# Patient Record
Sex: Female | Born: 1935 | Race: White | Hispanic: No | Marital: Married | Smoking: Former smoker
Health system: Southern US, Community
[De-identification: ages and names within clinical notes are randomized; demographics above are authoritative.]

## PROBLEM LIST (undated history)

## (undated) DIAGNOSIS — K219 Gastro-esophageal reflux disease without esophagitis: Secondary | ICD-10-CM

## (undated) DIAGNOSIS — C50919 Malignant neoplasm of unspecified site of unspecified female breast: Secondary | ICD-10-CM

## (undated) DIAGNOSIS — C801 Malignant (primary) neoplasm, unspecified: Secondary | ICD-10-CM

## (undated) DIAGNOSIS — I209 Angina pectoris, unspecified: Secondary | ICD-10-CM

## (undated) DIAGNOSIS — I10 Essential (primary) hypertension: Secondary | ICD-10-CM

## (undated) DIAGNOSIS — C3411 Malignant neoplasm of upper lobe, right bronchus or lung: Secondary | ICD-10-CM

## (undated) DIAGNOSIS — F32A Depression, unspecified: Secondary | ICD-10-CM

## (undated) DIAGNOSIS — D649 Anemia, unspecified: Secondary | ICD-10-CM

## (undated) DIAGNOSIS — E119 Type 2 diabetes mellitus without complications: Secondary | ICD-10-CM

## (undated) DIAGNOSIS — M1990 Unspecified osteoarthritis, unspecified site: Secondary | ICD-10-CM

## (undated) DIAGNOSIS — Z950 Presence of cardiac pacemaker: Secondary | ICD-10-CM

## (undated) DIAGNOSIS — F329 Major depressive disorder, single episode, unspecified: Secondary | ICD-10-CM

## (undated) DIAGNOSIS — J449 Chronic obstructive pulmonary disease, unspecified: Secondary | ICD-10-CM

## (undated) DIAGNOSIS — I509 Heart failure, unspecified: Secondary | ICD-10-CM

## (undated) DIAGNOSIS — I499 Cardiac arrhythmia, unspecified: Secondary | ICD-10-CM

## (undated) DIAGNOSIS — I2510 Atherosclerotic heart disease of native coronary artery without angina pectoris: Secondary | ICD-10-CM

## (undated) HISTORY — PX: APPENDECTOMY: SHX54

## (undated) HISTORY — PX: MASTECTOMY: SHX3

## (undated) HISTORY — PX: CHOLECYSTECTOMY: SHX55

## (undated) HISTORY — PX: CARDIOVERSION: SHX1299

## (undated) HISTORY — DX: Malignant neoplasm of unspecified site of unspecified female breast: C50.919

## (undated) HISTORY — PX: CORONARY ANGIOPLASTY: SHX604

## (undated) HISTORY — PX: JOINT REPLACEMENT: SHX530

## (undated) HISTORY — PX: INSERT / REPLACE / REMOVE PACEMAKER: SUR710

## (undated) HISTORY — DX: Malignant neoplasm of upper lobe, right bronchus or lung: C34.11

## (undated) HISTORY — PX: ABLATION OF DYSRHYTHMIC FOCUS: SHX254

## (undated) HISTORY — PX: CORONARY ARTERY BYPASS GRAFT: SHX141

---

## 1997-08-15 ENCOUNTER — Ambulatory Visit (HOSPITAL_COMMUNITY): Admission: RE | Admit: 1997-08-15 | Discharge: 1997-08-15 | Payer: Self-pay | Admitting: *Deleted

## 1999-05-27 ENCOUNTER — Inpatient Hospital Stay (HOSPITAL_COMMUNITY): Admission: EM | Admit: 1999-05-27 | Discharge: 1999-06-13 | Payer: Self-pay | Admitting: Cardiothoracic Surgery

## 1999-05-28 ENCOUNTER — Encounter: Payer: Self-pay | Admitting: Cardiothoracic Surgery

## 1999-05-29 ENCOUNTER — Encounter: Payer: Self-pay | Admitting: Cardiothoracic Surgery

## 1999-05-30 ENCOUNTER — Encounter: Payer: Self-pay | Admitting: Cardiothoracic Surgery

## 1999-05-31 ENCOUNTER — Encounter: Payer: Self-pay | Admitting: Cardiothoracic Surgery

## 1999-06-09 ENCOUNTER — Encounter: Payer: Self-pay | Admitting: Cardiothoracic Surgery

## 1999-06-10 ENCOUNTER — Encounter: Payer: Self-pay | Admitting: Thoracic Surgery (Cardiothoracic Vascular Surgery)

## 1999-06-10 ENCOUNTER — Encounter: Payer: Self-pay | Admitting: Cardiothoracic Surgery

## 1999-06-14 ENCOUNTER — Inpatient Hospital Stay (HOSPITAL_COMMUNITY): Admission: EM | Admit: 1999-06-14 | Discharge: 1999-06-22 | Payer: Self-pay | Admitting: Emergency Medicine

## 1999-06-18 ENCOUNTER — Encounter: Payer: Self-pay | Admitting: Cardiothoracic Surgery

## 1999-06-19 ENCOUNTER — Encounter: Payer: Self-pay | Admitting: Cardiothoracic Surgery

## 1999-06-20 ENCOUNTER — Encounter: Payer: Self-pay | Admitting: Cardiothoracic Surgery

## 2004-07-16 ENCOUNTER — Ambulatory Visit: Payer: Self-pay | Admitting: Orthopedic Surgery

## 2004-07-17 ENCOUNTER — Ambulatory Visit: Payer: Self-pay | Admitting: Orthopedic Surgery

## 2004-07-21 ENCOUNTER — Inpatient Hospital Stay: Payer: Self-pay | Admitting: Vascular Surgery

## 2004-07-28 ENCOUNTER — Ambulatory Visit: Payer: Self-pay | Admitting: Vascular Surgery

## 2004-08-08 ENCOUNTER — Inpatient Hospital Stay: Payer: Self-pay | Admitting: Internal Medicine

## 2004-08-22 ENCOUNTER — Observation Stay: Payer: Self-pay | Admitting: Vascular Surgery

## 2004-10-01 ENCOUNTER — Ambulatory Visit: Payer: Self-pay | Admitting: Orthopedic Surgery

## 2004-10-15 ENCOUNTER — Other Ambulatory Visit: Payer: Self-pay

## 2004-10-15 ENCOUNTER — Ambulatory Visit: Payer: Self-pay | Admitting: Orthopedic Surgery

## 2004-10-27 ENCOUNTER — Ambulatory Visit: Payer: Self-pay | Admitting: Internal Medicine

## 2005-01-06 ENCOUNTER — Ambulatory Visit: Payer: Self-pay | Admitting: Internal Medicine

## 2005-08-25 ENCOUNTER — Ambulatory Visit: Payer: Self-pay

## 2005-08-27 ENCOUNTER — Ambulatory Visit: Payer: Self-pay | Admitting: Internal Medicine

## 2005-09-07 ENCOUNTER — Ambulatory Visit: Payer: Self-pay | Admitting: Family

## 2006-04-15 ENCOUNTER — Other Ambulatory Visit: Payer: Self-pay

## 2006-04-15 ENCOUNTER — Ambulatory Visit: Payer: Self-pay | Admitting: Cardiology

## 2006-05-31 ENCOUNTER — Ambulatory Visit: Payer: Self-pay | Admitting: Cardiology

## 2006-09-13 ENCOUNTER — Ambulatory Visit: Payer: Self-pay | Admitting: Internal Medicine

## 2006-09-20 ENCOUNTER — Ambulatory Visit: Payer: Self-pay | Admitting: Internal Medicine

## 2006-10-06 ENCOUNTER — Ambulatory Visit: Payer: Self-pay | Admitting: Surgery

## 2006-10-28 ENCOUNTER — Ambulatory Visit: Payer: Self-pay | Admitting: Surgery

## 2006-11-09 ENCOUNTER — Ambulatory Visit: Payer: Self-pay | Admitting: Oncology

## 2006-11-25 ENCOUNTER — Ambulatory Visit: Payer: Self-pay | Admitting: Oncology

## 2006-12-10 ENCOUNTER — Ambulatory Visit: Payer: Self-pay | Admitting: Oncology

## 2007-01-10 ENCOUNTER — Ambulatory Visit: Payer: Self-pay | Admitting: Oncology

## 2007-02-09 ENCOUNTER — Ambulatory Visit: Payer: Self-pay | Admitting: Oncology

## 2007-03-12 ENCOUNTER — Ambulatory Visit: Payer: Self-pay | Admitting: Oncology

## 2007-04-11 ENCOUNTER — Ambulatory Visit: Payer: Self-pay | Admitting: Oncology

## 2007-06-12 ENCOUNTER — Ambulatory Visit: Payer: Self-pay | Admitting: Oncology

## 2007-07-10 ENCOUNTER — Ambulatory Visit: Payer: Self-pay | Admitting: Oncology

## 2007-07-15 ENCOUNTER — Ambulatory Visit: Payer: Self-pay | Admitting: Oncology

## 2007-08-10 ENCOUNTER — Ambulatory Visit: Payer: Self-pay | Admitting: Oncology

## 2007-08-22 ENCOUNTER — Ambulatory Visit: Payer: Self-pay | Admitting: Radiation Oncology

## 2007-09-09 ENCOUNTER — Ambulatory Visit: Payer: Self-pay | Admitting: Oncology

## 2007-09-09 ENCOUNTER — Ambulatory Visit: Payer: Self-pay | Admitting: Urology

## 2007-09-11 ENCOUNTER — Emergency Department: Payer: Self-pay | Admitting: Emergency Medicine

## 2007-09-19 ENCOUNTER — Ambulatory Visit: Payer: Self-pay | Admitting: Internal Medicine

## 2008-06-06 ENCOUNTER — Inpatient Hospital Stay: Payer: Self-pay | Admitting: Internal Medicine

## 2008-06-09 ENCOUNTER — Emergency Department: Payer: Self-pay | Admitting: Unknown Physician Specialty

## 2008-06-14 ENCOUNTER — Ambulatory Visit: Payer: Self-pay | Admitting: Internal Medicine

## 2008-07-16 ENCOUNTER — Ambulatory Visit: Payer: Self-pay | Admitting: Internal Medicine

## 2008-09-08 ENCOUNTER — Ambulatory Visit: Payer: Self-pay | Admitting: Oncology

## 2008-09-20 ENCOUNTER — Ambulatory Visit: Payer: Self-pay | Admitting: Internal Medicine

## 2008-09-24 ENCOUNTER — Ambulatory Visit: Payer: Self-pay | Admitting: Internal Medicine

## 2008-10-08 ENCOUNTER — Ambulatory Visit: Payer: Self-pay | Admitting: Oncology

## 2009-02-21 ENCOUNTER — Ambulatory Visit: Payer: Self-pay | Admitting: Internal Medicine

## 2009-03-04 ENCOUNTER — Ambulatory Visit: Payer: Self-pay | Admitting: Surgery

## 2009-03-14 ENCOUNTER — Inpatient Hospital Stay: Payer: Self-pay | Admitting: Internal Medicine

## 2009-06-28 ENCOUNTER — Ambulatory Visit: Payer: Self-pay | Admitting: Surgery

## 2009-08-21 ENCOUNTER — Ambulatory Visit: Payer: Self-pay | Admitting: Cardiology

## 2009-09-24 ENCOUNTER — Ambulatory Visit: Payer: Self-pay | Admitting: Cardiology

## 2009-10-01 ENCOUNTER — Ambulatory Visit: Payer: Self-pay | Admitting: Internal Medicine

## 2009-10-09 ENCOUNTER — Ambulatory Visit: Payer: Self-pay | Admitting: Oncology

## 2009-10-24 ENCOUNTER — Ambulatory Visit: Payer: Self-pay | Admitting: Oncology

## 2009-11-08 ENCOUNTER — Ambulatory Visit: Payer: Self-pay | Admitting: Oncology

## 2010-10-07 ENCOUNTER — Ambulatory Visit: Payer: Self-pay | Admitting: Internal Medicine

## 2010-10-27 ENCOUNTER — Ambulatory Visit: Payer: Self-pay | Admitting: Orthopedic Surgery

## 2010-11-24 ENCOUNTER — Ambulatory Visit: Payer: Self-pay | Admitting: Orthopedic Surgery

## 2010-12-02 ENCOUNTER — Inpatient Hospital Stay: Payer: Self-pay | Admitting: Orthopedic Surgery

## 2010-12-04 LAB — PATHOLOGY REPORT

## 2010-12-05 ENCOUNTER — Encounter: Payer: Self-pay | Admitting: Internal Medicine

## 2010-12-10 ENCOUNTER — Encounter: Payer: Self-pay | Admitting: Internal Medicine

## 2011-01-10 ENCOUNTER — Encounter: Payer: Self-pay | Admitting: Internal Medicine

## 2011-02-09 ENCOUNTER — Encounter: Payer: Self-pay | Admitting: Internal Medicine

## 2011-06-15 ENCOUNTER — Emergency Department: Payer: Self-pay | Admitting: Emergency Medicine

## 2011-06-15 LAB — CBC
HCT: 43 % (ref 35.0–47.0)
HGB: 14.3 g/dL (ref 12.0–16.0)
MCH: 31 pg (ref 26.0–34.0)
MCHC: 33.2 g/dL (ref 32.0–36.0)
MCV: 93 fL (ref 80–100)
Platelet: 245 10*3/uL (ref 150–440)
RBC: 4.61 10*6/uL (ref 3.80–5.20)
RDW: 14.1 % (ref 11.5–14.5)
WBC: 6.9 10*3/uL (ref 3.6–11.0)

## 2011-06-15 LAB — COMPREHENSIVE METABOLIC PANEL
Albumin: 4.1 g/dL (ref 3.4–5.0)
Alkaline Phosphatase: 85 U/L (ref 50–136)
Anion Gap: 15 (ref 7–16)
BUN: 24 mg/dL — ABNORMAL HIGH (ref 7–18)
Bilirubin,Total: 0.8 mg/dL (ref 0.2–1.0)
Calcium, Total: 9.2 mg/dL (ref 8.5–10.1)
Chloride: 101 mmol/L (ref 98–107)
Co2: 26 mmol/L (ref 21–32)
Creatinine: 0.91 mg/dL (ref 0.60–1.30)
EGFR (African American): >60
EGFR (Non-African Amer.): >60
Glucose: 182 mg/dL — ABNORMAL HIGH (ref 65–99)
Osmolality: 292 (ref 275–301)
Potassium: 3.2 mmol/L — ABNORMAL LOW (ref 3.5–5.1)
SGOT(AST): 24 U/L (ref 15–37)
SGPT (ALT): 19 U/L
Sodium: 142 mmol/L (ref 136–145)
Total Protein: 7.1 g/dL (ref 6.4–8.2)

## 2011-06-15 LAB — CK TOTAL AND CKMB (NOT AT ARMC)
CK, Total: 113 U/L (ref 21–215)
CK-MB: 3 ng/mL (ref 0.5–3.6)

## 2011-06-15 LAB — TROPONIN I: Troponin-I: <0.02 ng/mL

## 2011-10-08 ENCOUNTER — Ambulatory Visit: Payer: Self-pay | Admitting: Internal Medicine

## 2012-10-19 ENCOUNTER — Ambulatory Visit: Payer: Self-pay | Admitting: Internal Medicine

## 2012-11-04 ENCOUNTER — Ambulatory Visit: Payer: Self-pay | Admitting: Internal Medicine

## 2013-08-07 ENCOUNTER — Ambulatory Visit: Payer: Self-pay | Admitting: Gastroenterology

## 2013-08-08 LAB — PATHOLOGY REPORT

## 2013-08-17 ENCOUNTER — Other Ambulatory Visit: Payer: Self-pay | Admitting: Gastroenterology

## 2013-08-17 DIAGNOSIS — K5730 Diverticulosis of large intestine without perforation or abscess without bleeding: Secondary | ICD-10-CM

## 2013-08-29 ENCOUNTER — Inpatient Hospital Stay
Admission: RE | Admit: 2013-08-29 | Discharge: 2013-08-29 | Disposition: A | Discharge status: Home / Self Care | Payer: Self-pay | Source: Ambulatory Visit | Attending: Gastroenterology | Admitting: Gastroenterology

## 2013-09-04 ENCOUNTER — Ambulatory Visit
Admission: RE | Admit: 2013-09-04 | Discharge: 2013-09-04 | Disposition: A | Discharge status: Home / Self Care | Payer: Medicare Other | Source: Ambulatory Visit | Attending: Gastroenterology | Admitting: Gastroenterology

## 2013-09-04 DIAGNOSIS — K5730 Diverticulosis of large intestine without perforation or abscess without bleeding: Secondary | ICD-10-CM

## 2013-09-20 ENCOUNTER — Ambulatory Visit: Payer: Self-pay | Admitting: Gastroenterology

## 2013-09-29 ENCOUNTER — Ambulatory Visit: Payer: Self-pay | Admitting: Gastroenterology

## 2013-11-07 ENCOUNTER — Ambulatory Visit: Payer: Self-pay | Admitting: Internal Medicine

## 2013-11-14 ENCOUNTER — Ambulatory Visit: Payer: Self-pay | Admitting: Internal Medicine

## 2013-11-16 LAB — PATHOLOGY REPORT

## 2014-01-01 DIAGNOSIS — Z9289 Personal history of other medical treatment: Secondary | ICD-10-CM | POA: Insufficient documentation

## 2014-01-01 DIAGNOSIS — E119 Type 2 diabetes mellitus without complications: Secondary | ICD-10-CM | POA: Insufficient documentation

## 2014-01-01 DIAGNOSIS — Z950 Presence of cardiac pacemaker: Secondary | ICD-10-CM | POA: Insufficient documentation

## 2014-01-01 DIAGNOSIS — Z955 Presence of coronary angioplasty implant and graft: Secondary | ICD-10-CM | POA: Insufficient documentation

## 2014-01-01 DIAGNOSIS — I480 Paroxysmal atrial fibrillation: Secondary | ICD-10-CM | POA: Insufficient documentation

## 2014-01-01 DIAGNOSIS — Z9889 Other specified postprocedural states: Secondary | ICD-10-CM | POA: Insufficient documentation

## 2014-01-02 DIAGNOSIS — R0602 Shortness of breath: Secondary | ICD-10-CM | POA: Insufficient documentation

## 2014-01-13 ENCOUNTER — Emergency Department: Payer: Self-pay | Admitting: Emergency Medicine

## 2014-01-13 LAB — COMPREHENSIVE METABOLIC PANEL
ALBUMIN: 4 g/dL (ref 3.4–5.0)
ANION GAP: 8 (ref 7–16)
Alkaline Phosphatase: 86 U/L
BILIRUBIN TOTAL: 0.9 mg/dL (ref 0.2–1.0)
BUN: 27 mg/dL — ABNORMAL HIGH (ref 7–18)
CALCIUM: 8.9 mg/dL (ref 8.5–10.1)
CO2: 31 mmol/L (ref 21–32)
Chloride: 100 mmol/L (ref 98–107)
Creatinine: 1.19 mg/dL (ref 0.60–1.30)
EGFR (African American): 51 — ABNORMAL LOW
GFR CALC NON AF AMER: 44 — AB
Glucose: 146 mg/dL — ABNORMAL HIGH (ref 65–99)
OSMOLALITY: 285 (ref 275–301)
POTASSIUM: 3.6 mmol/L (ref 3.5–5.1)
SGOT(AST): 20 U/L (ref 15–37)
SGPT (ALT): 21 U/L
SODIUM: 139 mmol/L (ref 136–145)
Total Protein: 7.9 g/dL (ref 6.4–8.2)

## 2014-01-13 LAB — URINALYSIS, COMPLETE
Specific Gravity: 1.024 (ref 1.003–1.030)
Squamous Epithelial: NONE SEEN
WBC UR: 568 /HPF (ref 0–5)

## 2014-01-13 LAB — CBC
HCT: 40.2 % (ref 35.0–47.0)
HGB: 12.5 g/dL (ref 12.0–16.0)
MCH: 25.5 pg — ABNORMAL LOW (ref 26.0–34.0)
MCHC: 31.1 g/dL — ABNORMAL LOW (ref 32.0–36.0)
MCV: 82 fL (ref 80–100)
Platelet: 324 10*3/uL (ref 150–440)
RBC: 4.9 10*6/uL (ref 3.80–5.20)
RDW: 16.4 % — ABNORMAL HIGH (ref 11.5–14.5)
WBC: 11.5 10*3/uL — ABNORMAL HIGH (ref 3.6–11.0)

## 2014-01-15 LAB — URINE CULTURE

## 2014-09-28 ENCOUNTER — Other Ambulatory Visit: Payer: Self-pay | Admitting: Internal Medicine

## 2014-09-28 DIAGNOSIS — Z951 Presence of aortocoronary bypass graft: Secondary | ICD-10-CM

## 2014-09-28 DIAGNOSIS — D539 Nutritional anemia, unspecified: Secondary | ICD-10-CM

## 2014-09-28 DIAGNOSIS — R0602 Shortness of breath: Secondary | ICD-10-CM

## 2014-09-28 DIAGNOSIS — I480 Paroxysmal atrial fibrillation: Secondary | ICD-10-CM

## 2014-09-28 DIAGNOSIS — E119 Type 2 diabetes mellitus without complications: Secondary | ICD-10-CM

## 2014-09-28 DIAGNOSIS — R911 Solitary pulmonary nodule: Secondary | ICD-10-CM

## 2014-10-01 ENCOUNTER — Other Ambulatory Visit: Payer: Self-pay | Admitting: Internal Medicine

## 2014-10-01 DIAGNOSIS — Z87898 Personal history of other specified conditions: Secondary | ICD-10-CM

## 2014-10-09 ENCOUNTER — Ambulatory Visit
Admission: RE | Admit: 2014-10-09 | Discharge: 2014-10-09 | Disposition: A | Discharge status: Home / Self Care | Payer: Medicare Other | Source: Ambulatory Visit | Attending: Internal Medicine | Admitting: Internal Medicine

## 2014-10-09 DIAGNOSIS — C7802 Secondary malignant neoplasm of left lung: Secondary | ICD-10-CM | POA: Insufficient documentation

## 2014-10-09 DIAGNOSIS — Z951 Presence of aortocoronary bypass graft: Secondary | ICD-10-CM | POA: Diagnosis not present

## 2014-10-09 DIAGNOSIS — I517 Cardiomegaly: Secondary | ICD-10-CM | POA: Insufficient documentation

## 2014-10-09 DIAGNOSIS — I480 Paroxysmal atrial fibrillation: Secondary | ICD-10-CM | POA: Insufficient documentation

## 2014-10-09 DIAGNOSIS — Z09 Encounter for follow-up examination after completed treatment for conditions other than malignant neoplasm: Secondary | ICD-10-CM | POA: Diagnosis present

## 2014-10-09 DIAGNOSIS — K449 Diaphragmatic hernia without obstruction or gangrene: Secondary | ICD-10-CM | POA: Insufficient documentation

## 2014-10-09 DIAGNOSIS — C7801 Secondary malignant neoplasm of right lung: Secondary | ICD-10-CM | POA: Insufficient documentation

## 2014-10-09 DIAGNOSIS — D3502 Benign neoplasm of left adrenal gland: Secondary | ICD-10-CM | POA: Diagnosis not present

## 2014-10-09 DIAGNOSIS — R911 Solitary pulmonary nodule: Secondary | ICD-10-CM

## 2014-10-09 DIAGNOSIS — R590 Localized enlarged lymph nodes: Secondary | ICD-10-CM | POA: Diagnosis not present

## 2014-10-09 DIAGNOSIS — E119 Type 2 diabetes mellitus without complications: Secondary | ICD-10-CM

## 2014-10-09 DIAGNOSIS — D539 Nutritional anemia, unspecified: Secondary | ICD-10-CM | POA: Diagnosis not present

## 2014-10-09 DIAGNOSIS — R918 Other nonspecific abnormal finding of lung field: Secondary | ICD-10-CM | POA: Diagnosis not present

## 2014-10-09 DIAGNOSIS — R0602 Shortness of breath: Secondary | ICD-10-CM

## 2014-10-09 HISTORY — DX: Heart failure, unspecified: I50.9

## 2014-10-09 HISTORY — DX: Malignant (primary) neoplasm, unspecified: C80.1

## 2014-10-09 HISTORY — DX: Essential (primary) hypertension: I10

## 2014-10-09 HISTORY — DX: Type 2 diabetes mellitus without complications: E11.9

## 2014-10-09 MED ORDER — IOHEXOL 300 MG/ML  SOLN
75.0000 mL | Freq: Once | INTRAMUSCULAR | Status: AC | PRN
  Administered 2014-10-09: 75 mL via INTRAVENOUS

## 2014-10-15 ENCOUNTER — Inpatient Hospital Stay: Payer: Medicare Other | Attending: Oncology | Admitting: Oncology

## 2014-10-15 ENCOUNTER — Encounter: Payer: Self-pay | Admitting: Oncology

## 2014-10-15 ENCOUNTER — Telehealth: Payer: Self-pay | Admitting: *Deleted

## 2014-10-15 VITALS — BP 130/86 | HR 83 | Temp 96.1°F | Wt 181.0 lb

## 2014-10-15 DIAGNOSIS — K769 Liver disease, unspecified: Secondary | ICD-10-CM | POA: Diagnosis not present

## 2014-10-15 DIAGNOSIS — R05 Cough: Secondary | ICD-10-CM | POA: Diagnosis not present

## 2014-10-15 DIAGNOSIS — I10 Essential (primary) hypertension: Secondary | ICD-10-CM | POA: Insufficient documentation

## 2014-10-15 DIAGNOSIS — Z86010 Personal history of colonic polyps: Secondary | ICD-10-CM | POA: Diagnosis not present

## 2014-10-15 DIAGNOSIS — E119 Type 2 diabetes mellitus without complications: Secondary | ICD-10-CM | POA: Diagnosis not present

## 2014-10-15 DIAGNOSIS — K449 Diaphragmatic hernia without obstruction or gangrene: Secondary | ICD-10-CM

## 2014-10-15 DIAGNOSIS — Z7982 Long term (current) use of aspirin: Secondary | ICD-10-CM | POA: Diagnosis not present

## 2014-10-15 DIAGNOSIS — R918 Other nonspecific abnormal finding of lung field: Secondary | ICD-10-CM | POA: Diagnosis not present

## 2014-10-15 DIAGNOSIS — D0592 Unspecified type of carcinoma in situ of left breast: Secondary | ICD-10-CM

## 2014-10-15 DIAGNOSIS — G8929 Other chronic pain: Secondary | ICD-10-CM | POA: Diagnosis not present

## 2014-10-15 DIAGNOSIS — C787 Secondary malignant neoplasm of liver and intrahepatic bile duct: Secondary | ICD-10-CM | POA: Diagnosis not present

## 2014-10-15 DIAGNOSIS — C3411 Malignant neoplasm of upper lobe, right bronchus or lung: Secondary | ICD-10-CM | POA: Insufficient documentation

## 2014-10-15 DIAGNOSIS — Z800 Family history of malignant neoplasm of digestive organs: Secondary | ICD-10-CM | POA: Insufficient documentation

## 2014-10-15 DIAGNOSIS — Z87891 Personal history of nicotine dependence: Secondary | ICD-10-CM | POA: Insufficient documentation

## 2014-10-15 DIAGNOSIS — M1990 Unspecified osteoarthritis, unspecified site: Secondary | ICD-10-CM | POA: Diagnosis not present

## 2014-10-15 DIAGNOSIS — Z803 Family history of malignant neoplasm of breast: Secondary | ICD-10-CM | POA: Diagnosis not present

## 2014-10-15 DIAGNOSIS — I2510 Atherosclerotic heart disease of native coronary artery without angina pectoris: Secondary | ICD-10-CM | POA: Diagnosis not present

## 2014-10-15 DIAGNOSIS — Z79899 Other long term (current) drug therapy: Secondary | ICD-10-CM | POA: Diagnosis not present

## 2014-10-15 DIAGNOSIS — Z853 Personal history of malignant neoplasm of breast: Secondary | ICD-10-CM

## 2014-10-15 DIAGNOSIS — M545 Low back pain: Secondary | ICD-10-CM | POA: Insufficient documentation

## 2014-10-15 NOTE — Progress Notes (Signed)
[place] @ [facility] [phone]  [phone]     Pernell Dikes OB: 04-Jan-1933  MR#: [id number]  LKH#:[id number]  Patient Care Team: Tracie Harrier, MD as PCP - General (Internal Medicine)  CHIEF COMPLAINT:  Chief Complaint  Patient presents with  . New Evaluation       Carcinoma of breast                                AJCC Staging:  [id number]_                               Stage Grouping: I                               Cancer Status: Evidence of disease.                               Estrogen receptor positive and                               progesterone receptor positive. HER- 2                               Negative. 2.  Abnormal CT scan of the chest (June, 2016)   No history exists.    No flowsheet data found.  INTERVAL HISTORY: 81 year old lady with a previous history of carcinoma of breast also patient is a remote smoker started having cough initially was thought due to acute bronchitis.  Chest x-ray was abnormal CT scan was obtained.  Which revealed right upper lobe lung mass and right hilar lymph node.  Patient is being referred to me for further evaluation and treatment consideration. No hemoptysis.  No chest pain.  Appetite has been stable. REVIEW OF SYSTEMS:   Gen. status: Patient is extremely apprehensive and not in any acute distress.  Not lost any significant weight. Lungs: No chest pain.  No hemoptysis.  Dry hacking cough no difficulty swallowing HEENT: No headache.  No hearing loss.  No ear pain.  No nosebleed or congestion.  No sore throat.  No difficulty swallowing Cardiovascular system: No chest pain.  No palpitation.  No paroxysmal nocturnal dyspnea.  Patient has a pacemaker has a history of atrial fibrillation on chronic anticoagulation therapy Gastro intestinal system: No heartburn.  No nausea or vomiting.  No abdominal pain.  No diarrhea.  No constipation.  No rectal bleeding. Skin: No evidence of ecchymosis or  rash. Genitourinary system: No dysuria.  No hematuria.  No frequency.  No flank pain. Neurological system: No dizziness.  No tingling.  His was.  no tingling numbness.  no focal weakness or any focal signs. As per HPI. Otherwise, a complete review of systems is negatve.  PAST MEDICAL HISTORY: Past Medical History  Diagnosis Date  . Cancer   . CHF (congestive heart failure)   . Diabetes mellitus without complication   . Hypertension       Surgical History            Diabetes controlled with oral medication  Previous history of congestive heart                               failure                               Coronary artery disease with stent                               placement and bypass surgery Hypertension                               Osteoarthritis                               Colon polyps                               Chronic low back pain                                                              Past Surgical History:                               Coronary bypass surgery and stent                               placement                                Pacemaker because of atrial fibrillation                               Appendectomy                               Hemorrhoidectomy                                                              Family History:                               Family history of breast cancer and colon                               cancer. History of anemia, diabetes, heart                               disease, and hypertension  in the family.                                                              Social History:                               Does not smoke now. Does not drink. Used                               to smoke in the pa ADVANCED DIRECTIVES:  No flowsheet data found.  HEALTH MAINTENANCE: History  Substance Use Topics  . Smoking status: Former Research scientist (life sciences)  . Smokeless tobacco: Not on file  .  Alcohol Use: Not on file      Allergies  Allergen Reactions  . Amoxicillin-Pot Clavulanate Hives and Diarrhea  . Codeine Nausea And Vomiting and Other (See Comments)    Other Reaction: "very ill"  . Latex Rash  . Other Rash    Current Outpatient Prescriptions  Medication Sig Dispense Refill  . ALPRAZolam (XANAX) 0.5 MG tablet Take by mouth.    Marland Kitchen aspirin 81 MG tablet Take by mouth.    . Biotin 1000 MCG tablet Take by mouth.    . cyclobenzaprine (FLEXERIL) 5 MG tablet Take by mouth.    . dabigatran (PRADAXA) 150 MG CAPS capsule TAKE ONE CAPSULE BY MOUTH TWICE DAILY    . dicyclomine (BENTYL) 10 MG capsule Take one capsule three times a day, or about every 8hrs,  as needed for abdominal pain    . furosemide (LASIX) 40 MG tablet TAKE ONE TABLET BY MOUTH ONCE DAILY    . gabapentin (NEURONTIN) 100 MG capsule Take one tablet in the morning, take one tablet at 2pm, take two tablets before bedtime.    Marland Kitchen HYDROcodone-acetaminophen (NORCO/VICODIN) 5-325 MG per tablet 1/2 TAB PO Q 8 HRS PRN PAIN.    Marland Kitchen isosorbide mononitrate (IMDUR) 60 MG 24 hr tablet TAKE ONE TABLET BY MOUTH ONCE DAILY    . levothyroxine (SYNTHROID, LEVOTHROID) 25 MCG tablet TAKE ONE TABLET BY MOUTH IN THE MORNING    . Liraglutide 18 MG/3ML SOPN Inject into the skin.    Marland Kitchen losartan (COZAAR) 100 MG tablet TAKE ONE TABLET BY MOUTH ONCE DAILY    . Magnesium-Calcium-Folic Acid 163-846-6 MG TABS Take by mouth.    . metFORMIN (GLUCOPHAGE) 1000 MG tablet TAKE ONE TABLET BY MOUTH TWICE DAILY    . metoprolol-hydrochlorothiazide (LOPRESSOR HCT) 50-25 MG per tablet Take by mouth.    . nitroGLYCERIN (NITROSTAT) 0.4 MG SL tablet Place under the tongue.    . potassium chloride (K-DUR) 10 MEQ tablet Take by mouth.    . RABEprazole (ACIPHEX) 20 MG tablet     . simvastatin (ZOCOR) 20 MG tablet TAKE ONE TABLET BY MOUTH NIGHTLY    . aspirin EC 81 MG tablet Take 81 mg by mouth.    . Biotin 1 MG CAPS     . glipiZIDE (GLUCOTROL) 5 MG tablet Take 5  mg by mouth.     No current facility-administered medications for this visit.    OBJECTIVE:  Filed Vitals:   10/15/14 1154  BP: 130/86  Pulse: 83  Temp:  96.1 F (35.6 C)     There is no height on file to calculate BMI.    ECOG FS:1 - Symptomatic but completely ambulatory  PHYSICAL EXAM: General  status: Performance status is good.  Patient has not lost significant weight HEENT: No evidence of stomatitis. Sclera and conjunctivae :: No jaundice.   pale looking. Lungs: Air  entry equal on both sides.  No rhonchi.  No rales.  Cardiac: Heart sounds are normal.  No pericardial rub.  No murmur. Lymphatic system: Cervical, axillary, inguinal, lymph nodes not palpable GI: Abdomen is soft.  No ascites.  Liver spleen not palpable.  No tenderness.  Bowel sounds are within normal limit Lower extremity: No edema Neurological system: Higher functions, cranial nerves intact no evidence of peripheral neuropathy. Skin: No rash.  No ecchymosis.. Examination of both breasts.  Patient had down previous history of carcinoma of left breast no palpable masses axillary lymph nodes are normal.  Right breast free of masses.    LAB RESULTS:  No visits with results within 2 Day(s) from this visit. Latest known visit with results is:  [facility] Conversion on 01/13/2014  Component Date Value Ref Range Status  . WBC 01/13/2014 11.5* 3.6-11.0 x10 3/mm 3 Final  . RBC 01/13/2014 4.90  3.80-5.20 X10 6/mm 3 Final  . HGB 01/13/2014 12.5  12.0-16.0 g/dL Final  . HCT 01/13/2014 40.2  35.0-47.0 % Final  . MCV 01/13/2014 82  80-100 fL Final  . MCH 01/13/2014 25.5* 26.0-34.0 pg Final  . MCHC 01/13/2014 31.1* 32.0-36.0 g/dL Final  . RDW 01/13/2014 16.4* 11.5-14.5 % Final  . Platelet 01/13/2014 324  150-440 x10 3/mm 3 Final  . Glucose 01/13/2014 146* 65-99 mg/dL Final  . BUN 01/13/2014 27* 7-18 mg/dL Final  . Creatinine 01/13/2014 1.19  0.60-1.30 mg/dL Final  . Sodium 01/13/2014 139  136-145 mmol/L Final  . Potassium  01/13/2014 3.6  3.5-5.1 mmol/L Final  . Chloride 01/13/2014 100  98-107 mmol/L Final  . Co2 01/13/2014 31  21-32 mmol/L Final  . Calcium, Total 01/13/2014 8.9  8.5-10.1 mg/dL Final  . SGOT(AST) 01/13/2014 20  15-37 Unit/L Final  . SGPT (ALT) 01/13/2014 21   Final   Comment: 14-63 NOTE: New Reference Range 11/28/13   . Alkaline Phosphatase 01/13/2014 86   Final   Comment: 46-116 NOTE: New Reference Range 11/28/13   . Albumin 01/13/2014 4.0  3.4-5.0 g/dL Final  . Total Protein 01/13/2014 7.9  6.4-8.2 g/dL Final  . Bilirubin,Total 01/13/2014 0.9  0.2-1.0 mg/dL Final  . Osmolality 01/13/2014 285  275-301 Final  . Anion Gap 01/13/2014 8  7-16 Final  . EGFR (African American) 01/13/2014 51*  Final  . EGFR (Non-African Amer.) 01/13/2014 44*  Final   Comment: eGFR values <90m/min/1.73 m2 may be an indication of chronic kidney disease (CKD). Calculated eGFR is useful in patients with stable renal function. The eGFR calculation will not be reliable in acutely ill patients when serum creatinine is changing rapidly. It is not useful in  patients on dialysis. The eGFR calculation may not be applicable to patients at the low and high extremes of body sizes, pregnant women, and vegetarians.   . Color - urine 01/13/2014 RED   Final  . Clarity - urine 01/13/2014 CLOUDY   Final  . Glucose,UR 01/13/2014 see comment  0-75 mg/dL Final  . Bilirubin,UR 01/13/2014 see comment  NEGATIVE Final  . Ketone 01/13/2014 see comment  NEGATIVE Final  . Specific Gravity 01/13/2014 1.024  1.003-1.030 Final  .  Blood 01/13/2014 see comment  NEGATIVE Final  . Ph 01/13/2014 see comment  4.5-8.0 Final  . Protein 01/13/2014 see comment  NEGATIVE Final  . Nitrite 01/13/2014 SEE COMMENT  NEGATIVE Final  . Leukocyte Esterase 01/13/2014 see comment  NEGATIVE Final   Comment: ua dipstick - Unable to obtain valid dipstick results  - due to interference of gross blood in the  - specimen.   . RBC,UR 01/13/2014 11 /HPF   0-5 /HPF Final  . WBC UR 01/13/2014 568 /HPF  0-5 /HPF Final  . Bacteria 01/13/2014 3+  NONE SEEN Final  . Squamous Epithelial 01/13/2014 NONE SEEN   Final  . Micro Text Report 01/13/2014    Final                   Value:   SOURCE: CLEAN CATCH    ORGANISM 1                >100,000 CFU/ML ESCHERICHIA COLI   ANTIBIOTIC                    ORG#1     AMPICILLIN                    S         CEFAZOLIN                     S         CEFOXITIN                     S         CEFTRIAXONE                   S         CIPROFLOXACIN                 S         GENTAMICIN                    S         IMIPENEM                      S         LEVOFLOXACIN                  S         NITROFURANTOIN                S         TRIMETHOPRIM/SULFAMETHOXAZOLE S               STUDIES: Ct Chest W Contrast  10/09/2014   CLINICAL DATA:  Followup lung nodule identified on outside chest x-ray 09/13/2014. Current history of diabetes, hypertension. Prior history of left breast cancer post lumpectomy in 2010. Indwelling transvenous pacemaker.  EXAM: CT CHEST WITH CONTRAST  TECHNIQUE: Multidetector CT imaging of the chest was performed during intravenous contrast administration.  CONTRAST:  3m OMNIPAQUE IOHEXOL 300 MG/ML IV.  COMPARISON:  CTA chest 07/16/2008. Radiation therapy treatment planning CT 12/01/2006. The recent prior outside chest x-ray is not available for direct comparison.  FINDINGS: Contiguous adjacent masses in the right upper lobe, with a linear communication between them, possibly a segmental bronchus filled with soft tissue or mucous. The more central mass measures approximately 2.2 x 1.2 x 2.0 cm (series 3, image  16 and coronal image 72). The more peripheral, pleural-based mass measures approximately 4.0 x 3.5 x 2.6 cm (series 3, image 13 coronal image 70). There are numerous small nodules scattered throughout both lungs, an index nodule deep in the right lower lobe at the diaphragm measures approximately 9  mm (series 3, image 42). No pleural effusions.  Low right paratracheal (station 4R) lymphadenopathy, the nodal mass measuring approximately 3.8 x 3.0 x 3.6 cm (series 2, image 16 and coronal image 77). Right hilar lymphadenopathy, contiguous with the right paratracheal lymphadenopathy, measuring approximately 3.4 x 3.1 x 2.6 cm (series 2, image 21 and coronal image 79). Subcentimeter upper right paratracheal (station to our) lymph node. Left supraclavicular lymphadenopathy, incompletely imaged.  Heart moderately to markedly enlarged with biatrial enlargement. Transvenous pacemaker lead tips at the right atrial appendage and RV apex. Prior sternotomy for CABG. Severe atherosclerosis involving the thoracic and upper abdominal aorta without aneurysm or dissection. Atherosclerosis involving the proximal great vessels without visible stenosis.  Moderate-sized hiatal hernia, unchanged since the prior CT. Left adrenal nodule, unchanged since the prior CT. Surgically absent gallbladder. Visualized upper abdomen otherwise unremarkable. Bone window images demonstrate degenerative disc disease and spondylosis throughout the thoracic spine, a bone island in the T6 vertebral body, degenerative changes involving the lower cervical spine, and exaggeration of the usual thoracic kyphosis, but no definite osseous metastatic disease.  Postsurgical changes related to the prior left breast lumpectomy are noted.  IMPRESSION: 1. Contiguous adjacent masses involving the right upper lobe, measured above. As there is a linear communication between the 2 masses, endobronchial tumor within a segmental right upper lobe branch is suspected. 2. Right paratracheal and right hilar lymphadenopathy, measured above. Left supraclavicular lymphadenopathy is incompletely imaged. 3. Numerous bilateral pulmonary metastases. 4. Cardiomegaly with biatrial enlargement.  Prior CABG. 5. Stable moderate-sized hiatal hernia. 6. Stable left adrenal adenoma. 7. No  visible osseous metastases. 8. Postsurgical changes involving the left breast at the site of prior lumpectomy. The above findings are felt to more likely represent a primary lung malignancy with metastases rather than metastatic disease from the breast cancer.   Electronically Signed   By: Evangeline Dakin M.D.   On: 10/09/2014 17:43    ASSESSMENT: Abnormal CT scan of the chest.  This has been reviewed independently.  There is a right upper lobe mass.  There is segmental right endobronchial lesion.  Right paratracheal lymph node right hilar lymphadenopathy. Questionable liver nodule.  MEDICAL DECISION MAKING:  CT scan has been reviewed independently.  I also reviewed that with interventional radiologist.  Will also review with pulmonologist.   Bronchoscopy or needle biopsy would be recommended. Patient is on [place]  also will discuss with cardiologist to see if patient has been can be taken off [place]  for 48 hours  Patient expressed understanding and was in agreement with this plan. She also understands that She can call clinic at any time with any questions, concerns, or complaints.    No matching staging information was found for the patient.  Forest Gleason, MD   10/15/2014 9:02 PM

## 2014-10-15 NOTE — Telephone Encounter (Signed)
Called patient and left message that MD would like to get additional images and is ordering a CT of the abdomen and pelvis. One of our schedulers will be calling with that appointment.  Dr. Oliva Bustard will be on vacation next week so a follow up appointment will be made with Dr. Ma Hillock to review those results.

## 2014-10-15 NOTE — Progress Notes (Signed)
Pt former smoker.   Does have living will.  Referred by Dr. Ginette Pitman for lung cancer.  Hx of breast cancer.

## 2014-10-16 ENCOUNTER — Other Ambulatory Visit: Payer: Self-pay | Admitting: *Deleted

## 2014-10-16 DIAGNOSIS — R911 Solitary pulmonary nodule: Secondary | ICD-10-CM

## 2014-10-19 ENCOUNTER — Other Ambulatory Visit: Payer: Medicare Other

## 2014-10-19 ENCOUNTER — Ambulatory Visit: Admission: RE | Admit: 2014-10-19 | Payer: Medicare Other | Source: Ambulatory Visit

## 2014-10-22 ENCOUNTER — Encounter
Admission: RE | Admit: 2014-10-22 | Discharge: 2014-10-22 | Disposition: A | Discharge status: Home / Self Care | Payer: Medicare Other | Source: Ambulatory Visit | Attending: Internal Medicine | Admitting: Internal Medicine

## 2014-10-22 DIAGNOSIS — Z96651 Presence of right artificial knee joint: Secondary | ICD-10-CM | POA: Diagnosis not present

## 2014-10-22 DIAGNOSIS — I2510 Atherosclerotic heart disease of native coronary artery without angina pectoris: Secondary | ICD-10-CM | POA: Diagnosis not present

## 2014-10-22 DIAGNOSIS — J449 Chronic obstructive pulmonary disease, unspecified: Secondary | ICD-10-CM | POA: Diagnosis not present

## 2014-10-22 DIAGNOSIS — D3502 Benign neoplasm of left adrenal gland: Secondary | ICD-10-CM | POA: Diagnosis not present

## 2014-10-22 DIAGNOSIS — Z853 Personal history of malignant neoplasm of breast: Secondary | ICD-10-CM | POA: Diagnosis not present

## 2014-10-22 DIAGNOSIS — R590 Localized enlarged lymph nodes: Secondary | ICD-10-CM | POA: Diagnosis not present

## 2014-10-22 DIAGNOSIS — K449 Diaphragmatic hernia without obstruction or gangrene: Secondary | ICD-10-CM | POA: Diagnosis not present

## 2014-10-22 DIAGNOSIS — E119 Type 2 diabetes mellitus without complications: Secondary | ICD-10-CM | POA: Diagnosis not present

## 2014-10-22 DIAGNOSIS — C7802 Secondary malignant neoplasm of left lung: Secondary | ICD-10-CM | POA: Diagnosis not present

## 2014-10-22 DIAGNOSIS — Z951 Presence of aortocoronary bypass graft: Secondary | ICD-10-CM | POA: Diagnosis not present

## 2014-10-22 DIAGNOSIS — K219 Gastro-esophageal reflux disease without esophagitis: Secondary | ICD-10-CM | POA: Diagnosis not present

## 2014-10-22 DIAGNOSIS — R05 Cough: Secondary | ICD-10-CM | POA: Diagnosis not present

## 2014-10-22 DIAGNOSIS — I509 Heart failure, unspecified: Secondary | ICD-10-CM | POA: Diagnosis not present

## 2014-10-22 DIAGNOSIS — I10 Essential (primary) hypertension: Secondary | ICD-10-CM | POA: Diagnosis not present

## 2014-10-22 DIAGNOSIS — C7801 Secondary malignant neoplasm of right lung: Secondary | ICD-10-CM | POA: Diagnosis not present

## 2014-10-22 DIAGNOSIS — R918 Other nonspecific abnormal finding of lung field: Secondary | ICD-10-CM | POA: Diagnosis present

## 2014-10-22 DIAGNOSIS — Z87891 Personal history of nicotine dependence: Secondary | ICD-10-CM | POA: Diagnosis not present

## 2014-10-22 DIAGNOSIS — I4891 Unspecified atrial fibrillation: Secondary | ICD-10-CM | POA: Diagnosis not present

## 2014-10-22 DIAGNOSIS — Z950 Presence of cardiac pacemaker: Secondary | ICD-10-CM | POA: Diagnosis not present

## 2014-10-22 DIAGNOSIS — F329 Major depressive disorder, single episode, unspecified: Secondary | ICD-10-CM | POA: Diagnosis not present

## 2014-10-22 HISTORY — DX: Cardiac arrhythmia, unspecified: I49.9

## 2014-10-22 HISTORY — DX: Anemia, unspecified: D64.9

## 2014-10-22 HISTORY — DX: Major depressive disorder, single episode, unspecified: F32.9

## 2014-10-22 HISTORY — DX: Atherosclerotic heart disease of native coronary artery without angina pectoris: I25.10

## 2014-10-22 HISTORY — DX: Chronic obstructive pulmonary disease, unspecified: J44.9

## 2014-10-22 HISTORY — DX: Angina pectoris, unspecified: I20.9

## 2014-10-22 HISTORY — DX: Depression, unspecified: F32.A

## 2014-10-22 HISTORY — DX: Unspecified osteoarthritis, unspecified site: M19.90

## 2014-10-22 HISTORY — DX: Gastro-esophageal reflux disease without esophagitis: K21.9

## 2014-10-22 HISTORY — DX: Presence of cardiac pacemaker: Z95.0

## 2014-10-22 LAB — PROTIME-INR
INR: 1.06
Prothrombin Time: 14 seconds (ref 11.4–15.0)

## 2014-10-22 NOTE — OR Nursing (Signed)
Cbc,metb in care everywhere 09/25/13

## 2014-10-22 NOTE — Patient Instructions (Signed)
  Your procedure is scheduled on:10/24/14 Report to Day Surgery. To find out your arrival time please call [phone] between 1PM - 3PM on 10/23/14.  Remember: Instructions that are not followed completely may result in serious medical risk, up to and including death, or upon the discretion of your surgeon and anesthesiologist your surgery may need to be rescheduled.    _x___ 1. Do not eat food or drink liquids after midnight. No gum chewing or hard candies.     _x___ 2. No Alcohol for 24 hours before or after surgery.   ____ 3. Bring all medications with you on the day of surgery if instructed.    __x__ 4. Notify your doctor if there is any change in your medical condition     (cold, fever, infections).     Do not wear jewelry, make-up, hairpins, clips or nail polish.  Do not wear lotions, powders, or perfumes. You may wear deodorant.  Do not shave 48 hours prior to surgery. Men may shave face and neck.  Do not bring valuables to the hospital.    [facility] is not responsible for any belongings or valuables.               Contacts, dentures or bridgework may not be worn into surgery.  Leave your suitcase in the car. After surgery it may be brought to your room.  For patients admitted to the hospital, discharge time is determined by your                treatment team.   Patients discharged the day of surgery will not be allowed to drive home.   Please read over the following fact sheets that you were given:   Surgical Site Infection Prevention   ____ Take these medicines the morning of surgery with A SIP OF WATER:    1. aciphex  2. metoprolol  3. Isosorbide  4.levothyroxine  5.gabapentin  6.losartan  ____ Fleet Enema (as directed)   ____ Use CHG Soap as directed  ____ Use inhalers on the day of surgery  _x___ Stop metformin 2 days prior to surgery    ____ Take 1/2 of usual insulin dose the night before surgery and none on the morning of surgery.   __x__ Stop  Coumadin/Plavix/aspirin on   Aspirin already stopped ____ Stop Anti-inflammatories on    ____ Stop supplements until after surgery.    ____ Bring C-Pap to the hospital.

## 2014-10-23 MED ORDER — MORPHINE SULFATE 10 MG/ML IJ SOLN
INTRAMUSCULAR | Status: AC
  Filled 2014-10-23: qty 1

## 2014-10-24 ENCOUNTER — Encounter: Payer: Self-pay | Admitting: *Deleted

## 2014-10-24 ENCOUNTER — Ambulatory Visit: Payer: Medicare Other | Admitting: Certified Registered"

## 2014-10-24 ENCOUNTER — Encounter
Admission: RE | Disposition: A | Discharge status: Home / Self Care | Payer: Self-pay | Source: Ambulatory Visit | Attending: Internal Medicine

## 2014-10-24 ENCOUNTER — Ambulatory Visit
Admission: RE | Admit: 2014-10-24 | Discharge: 2014-10-24 | Disposition: A | Discharge status: Home / Self Care | Payer: Medicare Other | Source: Ambulatory Visit | Attending: Internal Medicine | Admitting: Internal Medicine

## 2014-10-24 DIAGNOSIS — Z951 Presence of aortocoronary bypass graft: Secondary | ICD-10-CM | POA: Insufficient documentation

## 2014-10-24 DIAGNOSIS — R590 Localized enlarged lymph nodes: Secondary | ICD-10-CM | POA: Insufficient documentation

## 2014-10-24 DIAGNOSIS — R05 Cough: Secondary | ICD-10-CM | POA: Insufficient documentation

## 2014-10-24 DIAGNOSIS — K219 Gastro-esophageal reflux disease without esophagitis: Secondary | ICD-10-CM | POA: Insufficient documentation

## 2014-10-24 DIAGNOSIS — E119 Type 2 diabetes mellitus without complications: Secondary | ICD-10-CM | POA: Insufficient documentation

## 2014-10-24 DIAGNOSIS — Z950 Presence of cardiac pacemaker: Secondary | ICD-10-CM | POA: Insufficient documentation

## 2014-10-24 DIAGNOSIS — I4891 Unspecified atrial fibrillation: Secondary | ICD-10-CM | POA: Insufficient documentation

## 2014-10-24 DIAGNOSIS — K449 Diaphragmatic hernia without obstruction or gangrene: Secondary | ICD-10-CM | POA: Insufficient documentation

## 2014-10-24 DIAGNOSIS — Z853 Personal history of malignant neoplasm of breast: Secondary | ICD-10-CM | POA: Insufficient documentation

## 2014-10-24 DIAGNOSIS — D3502 Benign neoplasm of left adrenal gland: Secondary | ICD-10-CM | POA: Insufficient documentation

## 2014-10-24 DIAGNOSIS — R918 Other nonspecific abnormal finding of lung field: Secondary | ICD-10-CM | POA: Insufficient documentation

## 2014-10-24 DIAGNOSIS — I10 Essential (primary) hypertension: Secondary | ICD-10-CM | POA: Insufficient documentation

## 2014-10-24 DIAGNOSIS — J449 Chronic obstructive pulmonary disease, unspecified: Secondary | ICD-10-CM | POA: Insufficient documentation

## 2014-10-24 DIAGNOSIS — Z87891 Personal history of nicotine dependence: Secondary | ICD-10-CM | POA: Insufficient documentation

## 2014-10-24 DIAGNOSIS — Z96651 Presence of right artificial knee joint: Secondary | ICD-10-CM | POA: Insufficient documentation

## 2014-10-24 DIAGNOSIS — F329 Major depressive disorder, single episode, unspecified: Secondary | ICD-10-CM | POA: Insufficient documentation

## 2014-10-24 DIAGNOSIS — C7802 Secondary malignant neoplasm of left lung: Secondary | ICD-10-CM | POA: Insufficient documentation

## 2014-10-24 DIAGNOSIS — I509 Heart failure, unspecified: Secondary | ICD-10-CM | POA: Insufficient documentation

## 2014-10-24 DIAGNOSIS — C7801 Secondary malignant neoplasm of right lung: Secondary | ICD-10-CM | POA: Insufficient documentation

## 2014-10-24 HISTORY — PX: ENDOBRONCHIAL ULTRASOUND: SHX5096

## 2014-10-24 LAB — GLUCOSE, CAPILLARY
GLUCOSE-CAPILLARY: 156 mg/dL — AB (ref 65–99)
Glucose-Capillary: 169 mg/dL — ABNORMAL HIGH (ref 65–99)

## 2014-10-24 SURGERY — ENDOBRONCHIAL ULTRASOUND (EBUS)
Anesthesia: General

## 2014-10-24 MED ORDER — ONDANSETRON HCL 4 MG/2ML IJ SOLN: 4.0000 mg | Freq: Once | INTRAMUSCULAR | Status: DC | PRN

## 2014-10-24 MED ORDER — FENTANYL CITRATE (PF) 100 MCG/2ML IJ SOLN: 25.0000 ug | INTRAMUSCULAR | Status: DC | PRN

## 2014-10-24 MED ORDER — IPRATROPIUM-ALBUTEROL 0.5-2.5 (3) MG/3ML IN SOLN
RESPIRATORY_TRACT | Status: AC
  Filled 2014-10-24: qty 3

## 2014-10-24 MED ORDER — SODIUM CHLORIDE 0.9 % IV SOLN
INTRAVENOUS | Status: DC | PRN
Start: 2014-10-24 — End: 2014-10-24
  Administered 2014-10-24: 14:00:00 via INTRAVENOUS

## 2014-10-24 MED ORDER — MIDAZOLAM HCL 2 MG/2ML IJ SOLN
INTRAMUSCULAR | Status: DC | PRN
  Administered 2014-10-24: 2 mg via INTRAVENOUS

## 2014-10-24 MED ORDER — CEFAZOLIN SODIUM-DEXTROSE 2-3 GM-% IV SOLR: INTRAVENOUS | Status: DC | PRN

## 2014-10-24 MED ORDER — GLYCOPYRROLATE 0.2 MG/ML IJ SOLN
INTRAMUSCULAR | Status: DC | PRN
  Administered 2014-10-24: .8 mg via INTRAVENOUS

## 2014-10-24 MED ORDER — ROCURONIUM BROMIDE 100 MG/10ML IV SOLN
INTRAVENOUS | Status: DC | PRN
  Administered 2014-10-24: 30 mg via INTRAVENOUS
  Administered 2014-10-24: 20 mg via INTRAVENOUS

## 2014-10-24 MED ORDER — PROPOFOL 10 MG/ML IV BOLUS
INTRAVENOUS | Status: DC | PRN
  Administered 2014-10-24: 120 mg via INTRAVENOUS

## 2014-10-24 MED ORDER — LIDOCAINE HCL (CARDIAC) 20 MG/ML IV SOLN
INTRAVENOUS | Status: DC | PRN
  Administered 2014-10-24: 80 mg via INTRAVENOUS

## 2014-10-24 MED ORDER — NEOSTIGMINE METHYLSULFATE 10 MG/10ML IV SOLN
INTRAVENOUS | Status: DC | PRN
  Administered 2014-10-24: 5 mg via INTRAVENOUS

## 2014-10-24 MED ORDER — FENTANYL CITRATE (PF) 100 MCG/2ML IJ SOLN
INTRAMUSCULAR | Status: DC | PRN
  Administered 2014-10-24: 50 ug via INTRAVENOUS

## 2014-10-24 MED ORDER — PHENYLEPHRINE HCL 10 MG/ML IJ SOLN
INTRAMUSCULAR | Status: DC | PRN
  Administered 2014-10-24 (×3): 100 ug via INTRAVENOUS
  Administered 2014-10-24: 200 ug via INTRAVENOUS

## 2014-10-24 MED ORDER — ONDANSETRON HCL 4 MG/2ML IJ SOLN
INTRAMUSCULAR | Status: DC | PRN
  Administered 2014-10-24: 4 mg via INTRAVENOUS

## 2014-10-24 NOTE — Anesthesia Preprocedure Evaluation (Addendum)
Anesthesia Evaluation  Patient identified by MRN, date of birth, ID band Patient awake    Reviewed: Allergy & Precautions, NPO status , Patient's Chart, lab work & pertinent test results, reviewed documented beta blocker date and time   History of Anesthesia Complications Negative for: history of anesthetic complications  Airway Mallampati: II  TM Distance: >3 FB Neck ROM: Full    Dental no notable dental hx.    Pulmonary neg pulmonary ROS, COPDformer smoker,  breath sounds clear to auscultation  Pulmonary exam normal       Cardiovascular hypertension, Pt. on home beta blockers and Pt. on medications + CAD and +CHF Normal cardiovascular exam+ dysrhythmias Atrial Fibrillation + pacemaker Rhythm:Regular Rate:Normal  S/p CABG   Neuro/Psych PSYCHIATRIC DISORDERS Depression negative neurological ROS     GI/Hepatic Neg liver ROS, GERD-  Medicated and Controlled,  Endo/Other  diabetes, Poorly Controlled, Type 2, Oral Hypoglycemic Agents  Renal/GU negative Renal ROS  negative genitourinary   Musculoskeletal  (+) Arthritis -,   Abdominal   Peds negative pediatric ROS (+)  Hematology  (+) anemia ,   Anesthesia Other Findings   Reproductive/Obstetrics negative OB ROS                            Anesthesia Physical Anesthesia Plan  ASA: III  Anesthesia Plan: General   Post-op Pain Management:    Induction: Intravenous  Airway Management Planned: Oral ETT  Additional Equipment:   Intra-op Plan:   Post-operative Plan: Extubation in OR  Informed Consent: I have reviewed the patients History and Physical, chart, labs and discussed the procedure including the risks, benefits and alternatives for the proposed anesthesia with the patient or authorized representative who has indicated his/her understanding and acceptance.   Dental advisory given  Plan Discussed with: CRNA and  Surgeon  Anesthesia Plan Comments:         Anesthesia Quick Evaluation

## 2014-10-24 NOTE — Anesthesia Procedure Notes (Signed)
Procedure Name: Intubation Date/Time: 10/24/2014 1:57 PM Performed by: Silvana Newness Pre-anesthesia Checklist: Patient identified, Emergency Drugs available, Suction available, Patient being monitored and Timeout performed Patient Re-evaluated:Patient Re-evaluated prior to inductionOxygen Delivery Method: Circle system utilized Preoxygenation: Pre-oxygenation with 100% oxygen Intubation Type: IV induction Ventilation: Mask ventilation without difficulty Grade View: Grade I Tube type: Oral Tube size: 8.5 mm Number of attempts: 1 Airway Equipment and Method: Rigid stylet Placement Confirmation: ETT inserted through vocal cords under direct vision,  positive ETCO2 and breath sounds checked- equal and bilateral Secured at: 23 cm Tube secured with: Tape Dental Injury: Teeth and Oropharynx as per pre-operative assessment

## 2014-10-24 NOTE — Procedures (Addendum)
PCCM Bronchoscopy Procedure Note  The patient was informed of the risks (including but not limited to bleeding, infection, respiratory failure, lung injury, tooth/oral injury) and benefits of the procedure and gave consent, see chart.  Indication: RUL lung mass with adenopathy  Location: RMS and RUL  Condition pre procedure: stable  Medications for procedure: propofol (see anesthesia note)  Procedure description: The bronchoscope was introduced through the endotracheal tubeand passed to the bilateral lungs to the level of the subsegmental bronchi throughout the tracheobronchial tree.  Airway exam revealed: Left lung - normal airway, normal mucosa, no endobronchial lesion Right LUNG - RMS with erythematous mucosa, yellow surface transition from RMS with right main orifice of the RUL Procedures performed: Using EBUS - FNA x 5 of station 10R Uning flex scope - forcep bx of lateral wall of RMS x 2 Specimens sent: see above  Condition post procedure: stabe  EBL: <2QM  Complicatons: none  Vilinda Boehringer, MD [facility] Pager [phone] (please enter 7 digits)

## 2014-10-24 NOTE — H&P (Signed)
PULMONARY / CRITICAL CARE MEDICINE HPI for Procedure   Name: Wendy Giles MRN: [id number] DOB: 1933-04-16    ADMISSION DATE:  10/24/2014  REFERRING MD :  Dr. Jeb Levering CHIEF COMPLAINT:  Cough, lung mass  HISTORY OF PRESENT ILLNESS:  81 year old lady with a previous history of carcinoma of breast also patient is a remote smoker started having cough initially was thought due to acute bronchitis. Chest x-ray was abnormal CT scan was obtained. Which revealed right upper lobe lung mass and right hilar lymph node. No hemoptysis. No chest pain. Appetite has been stable. Patient being referred for bronchscopic evaluation of the RUL lung mass REVIEW OF SYSTEMS:  Gen. status: Patient is extremely apprehensive and not in any acute distress. Not lost any significant weight. Lungs: No chest pain. No hemoptysis. Dry hacking cough no difficulty swallowing HEENT: No headache. No hearing loss. No ear pain. No nosebleed or congestion. No sore throat. No difficulty swallowing Cardiovascular system: No chest pain. No palpitation. No paroxysmal nocturnal dyspnea. Patient has a pacemaker has a history of atrial fibrillation on chronic anticoagulation therapy Gastro intestinal system: No heartburn. No nausea or vomiting. No abdominal pain. No diarrhea. No constipation. No rectal bleeding. Skin: No evidence of ecchymosis or rash. Genitourinary system: No dysuria. No hematuria. No frequency. No flank pain. Neurological system: No dizziness. No tingling. His was. no tingling numbness. no focal weakness or any focal signs. As per HPI. Otherwise, a complete review of systems is negatve.  PAST MEDICAL HISTORY :  Past Medical History  Diagnosis Date  . CHF (congestive heart failure)   . Diabetes mellitus without complication   . Hypertension   . Presence of permanent cardiac pacemaker   . Dysrhythmia     afib  . Depression   . GERD (gastroesophageal reflux disease)   .  Coronary artery disease   . Cancer     breast,uterine  . COPD (chronic obstructive pulmonary disease)   . Arthritis   . Anemia   . Anginal pain    Past Surgical History  Procedure Laterality Date  . Cholecystectomy    . Coronary angioplasty    . Coronary artery bypass graft    . Appendectomy    . Mastectomy    . Ablation of dysrhythmic focus    . Cardioversion    . Joint replacement      tkr   Prior to Admission medications   Medication Sig Start Date End Date Taking? Authorizing Provider  ALPRAZolam Duanne Moron) 0.5 MG tablet Take 0.5 mg by mouth 3 (three) times daily as needed for anxiety.  05/29/14  Yes Historical Provider, MD  aspirin 81 MG tablet Take 81 mg by mouth daily.  12/02/09  Yes Historical Provider, MD  Biotin 1000 MCG tablet Take 1,000 mcg by mouth daily.   Yes Historical Provider, MD  cyclobenzaprine (FLEXERIL) 5 MG tablet Take 5 mg by mouth daily as needed for muscle spasms.    Yes Historical Provider, MD  dabigatran (PRADAXA) 150 MG CAPS capsule TAKE ONE CAPSULE BY MOUTH TWICE DAILY 04/02/14  Yes Historical Provider, MD  dicyclomine (BENTYL) 10 MG capsule Take 10 mg by mouth 3 (three) times daily as needed (abdominal pain).  10/09/13  Yes Historical Provider, MD  furosemide (LASIX) 40 MG tablet Take 40 mg by mouth daily.  10/09/14  Yes Historical Provider, MD  gabapentin (NEURONTIN) 100 MG capsule Take 100 mg by mouth every morning.  08/15/14  Yes Historical Provider, MD  glipiZIDE (GLUCOTROL) 5 MG tablet Take  5 mg by mouth 2 (two) times daily.    Yes Historical Provider, MD  isosorbide mononitrate (IMDUR) 60 MG 24 hr tablet Take 60 mg by mouth daily.  09/10/14  Yes Historical Provider, MD  levothyroxine (SYNTHROID, LEVOTHROID) 25 MCG tablet Take 25 mcg by mouth daily before breakfast.  07/23/14  Yes Historical Provider, MD  Liraglutide 18 MG/3ML SOPN Inject 3 mLs into the skin daily.  06/26/14  Yes Historical Provider, MD  losartan (COZAAR) 100 MG tablet Take 100 mg by mouth  daily.  08/06/14  Yes Historical Provider, MD  Magnesium-Calcium-Folic Acid 761-607-3 MG TABS Take 1 tablet by mouth daily.  12/02/09  Yes Historical Provider, MD  metFORMIN (GLUCOPHAGE) 1000 MG tablet Take 1,000 mg by mouth 2 (two) times daily.  10/09/14  Yes Historical Provider, MD  metoprolol-hydrochlorothiazide (LOPRESSOR HCT) 50-25 MG per tablet Take 1 tablet by mouth daily.  04/13/14  Yes Historical Provider, MD  nitroGLYCERIN (NITROSTAT) 0.4 MG SL tablet Place 0.4 mg under the tongue every 5 (five) minutes as needed for chest pain.  05/29/14 05/29/15 Yes Historical Provider, MD  potassium chloride (K-DUR) 10 MEQ tablet Take 10 mEq by mouth 2 (two) times daily.    Yes Historical Provider, MD  RABEprazole (ACIPHEX) 20 MG tablet Take 20 mg by mouth daily.    Yes Historical Provider, MD  simvastatin (ZOCOR) 20 MG tablet Take 20 mg by mouth every evening.  08/27/14  Yes Historical Provider, MD   Allergies  Allergen Reactions  . Amoxicillin-Pot Clavulanate Hives and Diarrhea  . Codeine Nausea And Vomiting and Other (See Comments)    Other Reaction: "very ill"  . Latex Rash  . Other Rash    FAMILY HISTORY:  History reviewed. No pertinent family history. SOCIAL HISTORY:  reports that she quit smoking about 17 years ago. She has never used smokeless tobacco. She reports that she does not drink alcohol or use illicit drugs.  VITAL SIGNS: Temp:  [98.5 F (36.9 C)] 98.5 F (36.9 C) (06/15 1320) Pulse Rate:  [74] 74 (06/15 1320) Resp:  [18] 18 (06/15 1320) BP: (100)/(65) 100/65 mmHg (06/15 1320) SpO2:  [99 %] 99 % (06/15 1320) Weight:  [173 lb (78.472 kg)] 173 lb (78.472 kg) (06/15 1320)    PHYSICAL EXAM: General status: NAD HEENT: PERRLA, EOM  CVS: S1, S2, RRR, no murmurs Lungs: Air entry equal on both sides. No rhonchi. No rales.  GI: Abdomen is soft. No ascites. Liver spleen not palpable. No tenderness. Bowel sounds are within normal limit Lower extremity: No  edema Neurological system: CN II-XII nerves intact . Skin: No rash. No ecchymosis..   Recent Labs Lab 10/22/14 1044  INR 1.06   Glucose  Recent Labs Lab 10/24/14 1240  GLUCAP 156*    Imaging (The following images and results were reviewed by Dr. Stevenson Clinch). CT Chest 10/09/14 CT CHEST WITH CONTRAST  TECHNIQUE: Multidetector CT imaging of the chest was performed during intravenous contrast administration.  CONTRAST: 22m OMNIPAQUE IOHEXOL 300 MG/ML IV.  COMPARISON: CTA chest 07/16/2008. Radiation therapy treatment planning CT 12/01/2006. The recent prior outside chest x-ray is not available for direct comparison.  FINDINGS: Contiguous adjacent masses in the right upper lobe, with a linear communication between them, possibly a segmental bronchus filled with soft tissue or mucous. The more central mass measures approximately 2.2 x 1.2 x 2.0 cm (series 3, image 16 and coronal image 72). The more peripheral, pleural-based mass measures approximately 4.0 x 3.5 x 2.6 cm (series 3, image  13 coronal image 70). There are numerous small nodules scattered throughout both lungs, an index nodule deep in the right lower lobe at the diaphragm measures approximately 9 mm (series 3, image 42). No pleural effusions.  Low right paratracheal (station 4R) lymphadenopathy, the nodal mass measuring approximately 3.8 x 3.0 x 3.6 cm (series 2, image 16 and coronal image 77). Right hilar lymphadenopathy, contiguous with the right paratracheal lymphadenopathy, measuring approximately 3.4 x 3.1 x 2.6 cm (series 2, image 21 and coronal image 79). Subcentimeter upper right paratracheal (station to our) lymph node. Left supraclavicular lymphadenopathy, incompletely imaged.  Heart moderately to markedly enlarged with biatrial enlargement. Transvenous pacemaker lead tips at the right atrial appendage and RV apex. Prior sternotomy for CABG. Severe atherosclerosis involving the thoracic and  upper abdominal aorta without aneurysm or dissection. Atherosclerosis involving the proximal great vessels without visible stenosis.  Moderate-sized hiatal hernia, unchanged since the prior CT. Left adrenal nodule, unchanged since the prior CT. Surgically absent gallbladder. Visualized upper abdomen otherwise unremarkable. Bone window images demonstrate degenerative disc disease and spondylosis throughout the thoracic spine, a bone island in the T6 vertebral body, degenerative changes involving the lower cervical spine, and exaggeration of the usual thoracic kyphosis, but no definite osseous metastatic disease.  Postsurgical changes related to the prior left breast lumpectomy are noted.  IMPRESSION: 1. Contiguous adjacent masses involving the right upper lobe, measured above. As there is a linear communication between the 2 masses, endobronchial tumor within a segmental right upper lobe branch is suspected. 2. Right paratracheal and right hilar lymphadenopathy, measured above. Left supraclavicular lymphadenopathy is incompletely imaged. 3. Numerous bilateral pulmonary metastases. 4. Cardiomegaly with biatrial enlargement. Prior CABG. 5. Stable moderate-sized hiatal hernia. 6. Stable left adrenal adenoma. 7. No visible osseous metastases. 8. Postsurgical changes involving the left breast at the site of prior lumpectomy.   ASSESSMENT / PLAN: 81 year old female past medical history of breast cancer status post lumpectomy in 2010, now with recent CT chest findings of right upper lobe mass. Plan: -Plan for bronchoscopy with endobronchial biopsy, and also use of endobronchial ultrasound with fine-needle aspiration. -Risk, benefits, complications of the procedure discussed with the patient. -will send samples for malignancy workup    Vilinda Boehringer, MD [facility] Pager [phone] On Call Pager - [phone]   10/24/2014, 1:35 PM

## 2014-10-24 NOTE — Transfer of Care (Signed)
Immediate Anesthesia Transfer of Care Note  Patient: Wendy Giles  Procedure(s) Performed: Procedure(s): ENDOBRONCHIAL ULTRASOUND (N/A)  Patient Location: PACU  Anesthesia Type:General  Level of Consciousness: awake, alert , oriented and patient cooperative  Airway & Oxygen Therapy: Patient Spontanous Breathing and Patient connected to face mask oxygen  Post-op Assessment: Report given to RN, Post -op Vital signs reviewed and stable and Patient moving all extremities X 4  Post vital signs: Reviewed and stable  Last Vitals:  Filed Vitals:   10/24/14 1507  BP:   Pulse: 73  Temp: 36.7 C  Resp: 20    Complications: No apparent anesthesia complications

## 2014-10-25 NOTE — Anesthesia Postprocedure Evaluation (Signed)
  Anesthesia Post-op Note  Patient: Wendy Giles  Procedure(s) Performed: Procedure(s): ENDOBRONCHIAL ULTRASOUND (N/A)  Anesthesia type:General  Patient location: PACU  Post pain: Pain level controlled  Post assessment: Post-op Vital signs reviewed, Patient's Cardiovascular Status Stable, Respiratory Function Stable, Patent Airway and No signs of Nausea or vomiting  Post vital signs: Reviewed and stable  Last Vitals:  Filed Vitals:   10/24/14 1648  BP: 103/47  Pulse: 70  Temp: 35.6 C  Resp: 18    Level of consciousness: awake, alert  and patient cooperative  Complications: No apparent anesthesia complications

## 2014-10-25 NOTE — Op Note (Signed)
[facility] Patient Name: Wendy Giles Procedure Date: 10/24/2014 1:36 PM MRN: [id number] Account #: [id number] Date of Birth: Apr 09, 1933 Admit Type: Outpatient Age: 81 Room: [address] on 2nd floor Gender: Female Note Status: Finalized Attending MD: Vilinda Boehringer,  Procedure:         Bronchoscopy Indications:       Right upper lobe mass Providers:         Lin Hackmann, Eather Colas, Technician (Technician) Referring MD:       Medicines:         Propofol per Anesthesia Complications:     No immediate complications. Estimated blood loss: Minimal Procedure:         Pre-Anesthesia Assessment:                    - A History and Physical has been performed. The patient's                     medications, allergies and sensitivities have been                     reviewed.                    After obtaining informed consent, the bronchoscope was                     passed under direct vision. Throughout the procedure, the                     patient's blood pressure, pulse, and oxygen saturations                     were monitored continuously. the Bronchofibervideoscope                     Olympus [id number] was introduced through the                     mouth, via the endotracheal tube and advanced to the                     tracheobronchial tree. The procedure was accomplished                     without difficulty. Findings:      The nasopharynx/oropharynx appears normal. The larynx appears normal.       The vocal cords are normal and move normally with phonation and       breathing. The subglottic space is normal. The trachea is of normal       caliber. The carina is sharp. The tracheobronchial tree of the left lung       was examined to at least the first subsegmental level. Bronchial mucosa       and anatomy in the left lung are normal; there are no endobronchial       lesions, and no secretions.      Right Lung Abnormalities: Erythema was found  in the right mainstem       bronchus and in the right upper lobe. An area of inflamed mucosa was       found in the right mainstem bronchus.      Endobronchial biopsies were performed in the right mainstem bronchus of       the lung using a forceps and sent for histopathology examination. Two  samples were obtained.      An endobronchial ultrasound endoscope was utilized to examine the right       hilar region (level 10R) in order to assist with fine needle aspiration.      Endobronchial biopsies were performed. Impression:        - The left lung was normal.                    - Erythema was present in the right mainstem bronchus and                     in the right upper lobe.                    - Mucosal inflammation was visualized in the right                     mainstem bronchus.                    - An endobronchial biopsy was performed.                    - Endobronchial ultrasound was performed.                    - An endobronchial biopsy was performed. Recommendation:    - Await biopsy and cytology results. Wendy Giles,  10/24/2014 3:05:31 PM Number of Addenda: 0 Note Initiated On: 10/24/2014 1:36 PM      [facility]

## 2014-10-26 ENCOUNTER — Ambulatory Visit
Admission: RE | Admit: 2014-10-26 | Discharge: 2014-10-26 | Disposition: A | Discharge status: Home / Self Care | Payer: Medicare Other | Source: Ambulatory Visit | Attending: Oncology | Admitting: Oncology

## 2014-10-26 DIAGNOSIS — R932 Abnormal findings on diagnostic imaging of liver and biliary tract: Secondary | ICD-10-CM | POA: Insufficient documentation

## 2014-10-26 DIAGNOSIS — R911 Solitary pulmonary nodule: Secondary | ICD-10-CM

## 2014-10-26 DIAGNOSIS — R918 Other nonspecific abnormal finding of lung field: Secondary | ICD-10-CM | POA: Diagnosis not present

## 2014-10-26 LAB — SURGICAL PATHOLOGY

## 2014-10-26 MED ORDER — IOHEXOL 300 MG/ML  SOLN
80.0000 mL | Freq: Once | INTRAMUSCULAR | Status: AC | PRN
  Administered 2014-10-26: 80 mL via INTRAVENOUS

## 2014-10-29 DIAGNOSIS — Z9889 Other specified postprocedural states: Secondary | ICD-10-CM | POA: Insufficient documentation

## 2014-10-29 LAB — CYTOLOGY - NON PAP

## 2014-10-31 ENCOUNTER — Inpatient Hospital Stay (HOSPITAL_BASED_OUTPATIENT_CLINIC_OR_DEPARTMENT_OTHER): Payer: Medicare Other | Admitting: Oncology

## 2014-10-31 VITALS — BP 126/73 | HR 87 | Temp 97.2°F | Wt 178.4 lb

## 2014-10-31 DIAGNOSIS — R918 Other nonspecific abnormal finding of lung field: Secondary | ICD-10-CM | POA: Diagnosis not present

## 2014-10-31 DIAGNOSIS — G8929 Other chronic pain: Secondary | ICD-10-CM

## 2014-10-31 DIAGNOSIS — Z7982 Long term (current) use of aspirin: Secondary | ICD-10-CM

## 2014-10-31 DIAGNOSIS — Z87891 Personal history of nicotine dependence: Secondary | ICD-10-CM

## 2014-10-31 DIAGNOSIS — C3490 Malignant neoplasm of unspecified part of unspecified bronchus or lung: Secondary | ICD-10-CM

## 2014-10-31 DIAGNOSIS — I2510 Atherosclerotic heart disease of native coronary artery without angina pectoris: Secondary | ICD-10-CM

## 2014-10-31 DIAGNOSIS — Z79899 Other long term (current) drug therapy: Secondary | ICD-10-CM

## 2014-10-31 DIAGNOSIS — M545 Low back pain: Secondary | ICD-10-CM

## 2014-10-31 DIAGNOSIS — K449 Diaphragmatic hernia without obstruction or gangrene: Secondary | ICD-10-CM

## 2014-10-31 DIAGNOSIS — C3411 Malignant neoplasm of upper lobe, right bronchus or lung: Secondary | ICD-10-CM

## 2014-10-31 DIAGNOSIS — Z853 Personal history of malignant neoplasm of breast: Secondary | ICD-10-CM

## 2014-10-31 DIAGNOSIS — K769 Liver disease, unspecified: Secondary | ICD-10-CM

## 2014-10-31 DIAGNOSIS — E119 Type 2 diabetes mellitus without complications: Secondary | ICD-10-CM

## 2014-10-31 DIAGNOSIS — M1990 Unspecified osteoarthritis, unspecified site: Secondary | ICD-10-CM

## 2014-10-31 DIAGNOSIS — Z800 Family history of malignant neoplasm of digestive organs: Secondary | ICD-10-CM

## 2014-10-31 DIAGNOSIS — R05 Cough: Secondary | ICD-10-CM

## 2014-10-31 DIAGNOSIS — C787 Secondary malignant neoplasm of liver and intrahepatic bile duct: Secondary | ICD-10-CM | POA: Diagnosis not present

## 2014-10-31 DIAGNOSIS — Z803 Family history of malignant neoplasm of breast: Secondary | ICD-10-CM

## 2014-10-31 DIAGNOSIS — I10 Essential (primary) hypertension: Secondary | ICD-10-CM

## 2014-10-31 DIAGNOSIS — Z86010 Personal history of colonic polyps: Secondary | ICD-10-CM

## 2014-10-31 NOTE — Patient Instructions (Signed)
Etoposide, VP-16 injection What is this medicine? ETOPOSIDE, VP-16 (e toe POE side) is a chemotherapy drug. It is used to treat testicular cancer, lung cancer, and other cancers. This medicine may be used for other purposes; ask your health care provider or pharmacist if you have questions. COMMON BRAND NAME(S): Etopophos, Toposar, VePesid What should I tell my health care provider before I take this medicine? They need to know if you have any of these conditions: -infection -kidney disease -low blood counts, like low white cell, platelet, or red cell counts -an unusual or allergic reaction to etoposide, other chemotherapeutic agents, other medicines, foods, dyes, or preservatives -pregnant or trying to get pregnant -breast-feeding How should I use this medicine? This medicine is for infusion into a vein. It is administered in a hospital or clinic by a specially trained health care professional. Talk to your pediatrician regarding the use of this medicine in children. Special care may be needed. Overdosage: If you think you have taken too much of this medicine contact a poison control center or emergency room at once. NOTE: This medicine is only for you. Do not share this medicine with others. What if I miss a dose? It is important not to miss your dose. Call your doctor or health care professional if you are unable to keep an appointment. What may interact with this medicine? -cyclosporine -medicines to increase blood counts like filgrastim, pegfilgrastim, sargramostim -vaccines This list may not describe all possible interactions. Give your health care provider a list of all the medicines, herbs, non-prescription drugs, or dietary supplements you use. Also tell them if you smoke, drink alcohol, or use illegal drugs. Some items may interact with your medicine. What should I watch for while using this medicine? Visit your doctor for checks on your progress. This drug may make you feel  generally unwell. This is not uncommon, as chemotherapy can affect healthy cells as well as cancer cells. Report any side effects. Continue your course of treatment even though you feel ill unless your doctor tells you to stop. In some cases, you may be given additional medicines to help with side effects. Follow all directions for their use. Call your doctor or health care professional for advice if you get a fever, chills or sore throat, or other symptoms of a cold or flu. Do not treat yourself. This drug decreases your body's ability to fight infections. Try to avoid being around people who are sick. This medicine may increase your risk to bruise or bleed. Call your doctor or health care professional if you notice any unusual bleeding. Be careful brushing and flossing your teeth or using a toothpick because you may get an infection or bleed more easily. If you have any dental work done, tell your dentist you are receiving this medicine. Avoid taking products that contain aspirin, acetaminophen, ibuprofen, naproxen, or ketoprofen unless instructed by your doctor. These medicines may hide a fever. Do not become pregnant while taking this medicine. Women should inform their doctor if they wish to become pregnant or think they might be pregnant. There is a potential for serious side effects to an unborn child. Talk to your health care professional or pharmacist for more information. Do not breast-feed an infant while taking this medicine. What side effects may I notice from receiving this medicine? Side effects that you should report to your doctor or health care professional as soon as possible: -allergic reactions like skin rash, itching or hives, swelling of the face, lips, or tongue -  medicine?  Side effects that you should report to your doctor or health care professional as soon as possible:  -allergic reactions like skin rash, itching or hives, swelling of the face, lips, or tongue  -low blood counts - this medicine may decrease the number of white blood cells, red blood cells and platelets. You may be at increased risk for infections and bleeding.  -signs of infection - fever or chills, cough, sore  throat, pain or difficulty passing urine  -signs of decreased platelets or bleeding - bruising, pinpoint red spots on the skin, black, tarry stools, blood in the urine  -signs of decreased red blood cells - unusually weak or tired, fainting spells, lightheadedness  -breathing problems  -changes in vision  -mouth or throat sores or ulcers  -pain, redness, swelling or irritation at the injection site  -pain, tingling, numbness in the hands or feet  -redness, blistering, peeling or loosening of the skin, including inside the mouth  -seizures  -vomiting  Side effects that usually do not require medical attention (report to your doctor or health care professional if they continue or are bothersome):  -diarrhea  -hair loss  -loss of appetite  -nausea  -stomach pain  This list may not describe all possible side effects. Call your doctor for medical advice about side effects. You may report side effects to FDA at 1-800-FDA-1088.  Where should I keep my medicine?  This drug is given in a hospital or clinic and will not be stored at home.  NOTE: This sheet is a summary. It may not cover all possible information. If you have questions about this medicine, talk to your doctor, pharmacist, or health care provider.   2015, [employer]. (2007-08-29 17:24:12)  Carboplatin injection  What is this medicine?  CARBOPLATIN (KAR boe pla tin) is a chemotherapy drug. It targets fast dividing cells, like cancer cells, and causes these cells to die. This medicine is used to treat ovarian cancer and many other cancers.  This medicine may be used for other purposes; ask your health care provider or pharmacist if you have questions.  COMMON BRAND NAME(S): Paraplatin  What should I tell my health care provider before I take this medicine?  They need to know if you have any of these conditions:  -blood disorders  -hearing problems  -kidney disease  -recent or ongoing radiation therapy  -an unusual or allergic reaction to carboplatin,  cisplatin, other chemotherapy, other medicines, foods, dyes, or preservatives  -pregnant or trying to get pregnant  -breast-feeding  How should I use this medicine?  This drug is usually given as an infusion into a vein. It is administered in a hospital or clinic by a specially trained health care professional.  Talk to your pediatrician regarding the use of this medicine in children. Special care may be needed.  Overdosage: If you think you have taken too much of this medicine contact a poison control center or emergency room at once.  NOTE: This medicine is only for you. Do not share this medicine with others.  What if I miss a dose?  It is important not to miss a dose. Call your doctor or health care professional if you are unable to keep an appointment.  What may interact with this medicine?  -medicines for seizures  -medicines to increase blood counts like filgrastim, pegfilgrastim, sargramostim  -some antibiotics like amikacin, gentamicin, neomycin, streptomycin, tobramycin  -vaccines  Talk to your doctor or health care professional before taking any of these   medicine. This drug may make you feel generally unwell. This is not uncommon, as chemotherapy can affect healthy cells as well as cancer cells. Report any side effects. Continue your course of treatment even though you feel ill unless your doctor tells you to stop. In some cases, you may be given  additional medicines to help with side effects. Follow all directions for their use. Call your doctor or health care professional for advice if you get a fever, chills or sore throat, or other symptoms of a cold or flu. Do not treat yourself. This drug decreases your body's ability to fight infections. Try to avoid being around people who are sick. This medicine may increase your risk to bruise or bleed. Call your doctor or health care professional if you notice any unusual bleeding. Be careful brushing and flossing your teeth or using a toothpick because you may get an infection or bleed more easily. If you have any dental work done, tell your dentist you are receiving this medicine. Avoid taking products that contain aspirin, acetaminophen, ibuprofen, naproxen, or ketoprofen unless instructed by your doctor. These medicines may hide a fever. Do not become pregnant while taking this medicine. Women should inform their doctor if they wish to become pregnant or think they might be pregnant. There is a potential for serious side effects to an unborn child. Talk to your health care professional or pharmacist for more information. Do not breast-feed an infant while taking this medicine. What side effects may I notice from receiving this medicine? Side effects that you should report to your doctor or health care professional as soon as possible: -allergic reactions like skin rash, itching or hives, swelling of the face, lips, or tongue -signs of infection - fever or chills, cough, sore throat, pain or difficulty passing urine -signs of decreased platelets or bleeding - bruising, pinpoint red spots on the skin, black, tarry stools, nosebleeds -signs of decreased red blood cells - unusually weak or tired, fainting spells, lightheadedness -breathing problems -changes in hearing -changes in vision -chest pain -high blood pressure -low blood counts - This drug may decrease the number of white blood cells, red  blood cells and platelets. You may be at increased risk for infections and bleeding. -nausea and vomiting -pain, swelling, redness or irritation at the injection site -pain, tingling, numbness in the hands or feet -problems with balance, talking, walking -trouble passing urine or change in the amount of urine Side effects that usually do not require medical attention (report to your doctor or health care professional if they continue or are bothersome): -hair loss -loss of appetite -metallic taste in the mouth or changes in taste This list may not describe all possible side effects. Call your doctor for medical advice about side effects. You may report side effects to FDA at 1-800-FDA-1088. Where should I keep my medicine? This drug is given in a hospital or clinic and will not be stored at home. NOTE: This sheet is a summary. It may not cover all possible information. If you have questions about this medicine, talk to your doctor, pharmacist, or health care provider.  2015, [employer]. (2007-08-02 14:38:05) Pegfilgrastim injection What is this medicine? PEGFILGRASTIM (peg fil GRA stim) is a long-acting granulocyte colony-stimulating factor that stimulates the growth of neutrophils, a type of white blood cell important in the body's fight against infection. It is used to reduce the incidence of fever and infection in patients with certain types of cancer who are receiving chemotherapy that affects  the bone marrow. This medicine may be used for other purposes; ask your health care provider or pharmacist if you have questions. COMMON BRAND NAME(S): Neulasta What should I tell my health care provider before I take this medicine? They need to know if you have any of these conditions: -latex allergy -ongoing radiation therapy -sickle cell disease -skin reactions to acrylic adhesives (On-Body Injector only) -an unusual or allergic reaction to pegfilgrastim, filgrastim, other  medicines, foods, dyes, or preservatives -pregnant or trying to get pregnant -breast-feeding How should I use this medicine? This medicine is for injection under the skin. If you get this medicine at home, you will be taught how to prepare and give the pre-filled syringe or how to use the On-body Injector. Refer to the patient Instructions for Use for detailed instructions. Use exactly as directed. Take your medicine at regular intervals. Do not take your medicine more often than directed. It is important that you put your used needles and syringes in a special sharps container. Do not put them in a trash can. If you do not have a sharps container, call your pharmacist or healthcare provider to get one. Talk to your pediatrician regarding the use of this medicine in children. Special care may be needed. Overdosage: If you think you have taken too much of this medicine contact a poison control center or emergency room at once. NOTE: This medicine is only for you. Do not share this medicine with others. What if I miss a dose? It is important not to miss your dose. Call your doctor or health care professional if you miss your dose. If you miss a dose due to an On-body Injector failure or leakage, a new dose should be administered as soon as possible using a single prefilled syringe for manual use. What may interact with this medicine? Interactions have not been studied. Give your health care provider a list of all the medicines, herbs, non-prescription drugs, or dietary supplements you use. Also tell them if you smoke, drink alcohol, or use illegal drugs. Some items may interact with your medicine. This list may not describe all possible interactions. Give your health care provider a list of all the medicines, herbs, non-prescription drugs, or dietary supplements you use. Also tell them if you smoke, drink alcohol, or use illegal drugs. Some items may interact with your medicine. What should I watch for  while using this medicine? You may need blood work done while you are taking this medicine. If you are going to need a MRI, CT scan, or other procedure, tell your doctor that you are using this medicine (On-Body Injector only). What side effects may I notice from receiving this medicine? Side effects that you should report to your doctor or health care professional as soon as possible: -allergic reactions like skin rash, itching or hives, swelling of the face, lips, or tongue -dizziness -fever -pain, redness, or irritation at site where injected -pinpoint red spots on the skin -shortness of breath or breathing problems -stomach or side pain, or pain at the shoulder -swelling -tiredness -trouble passing urine Side effects that usually do not require medical attention (report to your doctor or health care professional if they continue or are bothersome): -bone pain -muscle pain This list may not describe all possible side effects. Call your doctor for medical advice about side effects. You may report side effects to FDA at 1-800-FDA-1088. Where should I keep my medicine? Keep out of the reach of children. Store pre-filled syringes in a  refrigerator between 2 and 8 degrees C (36 and 46 degrees F). Do not freeze. Keep in carton to protect from light. Throw away this medicine if it is left out of the refrigerator for more than 48 hours. Throw away any unused medicine after the expiration date. NOTE: This sheet is a summary. It may not cover all possible information. If you have questions about this medicine, talk to your doctor, pharmacist, or health care provider.  2015, [employer]. (2013-07-27 16:14:05)

## 2014-10-31 NOTE — Progress Notes (Signed)
Patient does have living will.  Former smoker. Patient saw Dr. Ginette Pitman for respiratory problems.  CXR done and patient put on abx.

## 2014-11-01 ENCOUNTER — Ambulatory Visit: Payer: Medicare Other

## 2014-11-02 ENCOUNTER — Other Ambulatory Visit: Payer: Self-pay | Admitting: *Deleted

## 2014-11-02 DIAGNOSIS — R918 Other nonspecific abnormal finding of lung field: Secondary | ICD-10-CM

## 2014-11-02 MED ORDER — LIDOCAINE-PRILOCAINE 2.5-2.5 % EX CREA: 1.0000 "application " | TOPICAL_CREAM | CUTANEOUS | Status: DC | PRN

## 2014-11-03 ENCOUNTER — Encounter: Payer: Self-pay | Admitting: Oncology

## 2014-11-03 DIAGNOSIS — C3411 Malignant neoplasm of upper lobe, right bronchus or lung: Secondary | ICD-10-CM

## 2014-11-03 HISTORY — DX: Malignant neoplasm of upper lobe, right bronchus or lung: C34.11

## 2014-11-03 NOTE — Progress Notes (Signed)
[place] @ [facility] [phone]  Fax:([phone]     Lester Platas OB: 17-May-1932  MR#: [id number]  DEY#:[id number]  Patient Care Team: Tracie Harrier, MD as PCP - General (Internal Medicine) Isaias Cowman, MD as Consulting Physician (Cardiology)  CHIEF COMPLAINT:  Chief Complaint  Patient presents with  . Follow-up       Carcinoma of breast                                AJCC Staging:  [id number]_                               Stage Grouping: I                               Cancer Status: Evidence of disease.                               Estrogen receptor positive and                               progesterone receptor positive. HER- 2                               Negative. 2.  Abnormal CT scan of the chest (June, 2016)  Oncology History   1.  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  Previous history of carcinoma breast stage I disease     Cancer of upper lobe of right lung   11/03/2014 Initial Diagnosis Cancer of upper lobe of right lung    Oncology Flowsheet 10/24/2014  ondansetron (ZOFRAN) IV -    INTERVAL HISTORY: 81 year old lady with a previous history of carcinoma of breast also patient is a remote smoker started having cough initially was thought due to acute bronchitis.  Chest x-ray was abnormal CT scan was obtained.  Which revealed right upper lobe lung mass and right hilar lymph node.  Patient is being referred to me for further evaluation and treatment consideration. No hemoptysis.  No chest pain.  Appetite has been stable. In June, 2016 Patient is here for evaluation patient has undergone EBUS  and bronchoscopy.  Bronchoscopy specimen was negative however biopsy was consistent with small cell undifferentiated carcinoma of lung.  Patient's CT scan of the liver revealed multiple  metastases in the liver.  The patient is here to discuss the results and the further planning of treatment Continues to have dry hacking cough.  Patient was started on prednisone without much relief in the cough.  No hemoptysis.  No chest pain.  No difficulty swallowing. REVIEW OF SYSTEMS:   Gen. status: Patient is extremely apprehensive and not in any acute distress.  Not lost any significant weight. Lungs: No chest pain.  No hemoptysis.  Dry hacking cough no difficulty swallowing HEENT: No headache.  No hearing loss.  No ear pain.  No nosebleed or congestion.  No sore throat.  No difficulty swallowing Cardiovascular system: No chest pain.  No palpitation.  No paroxysmal nocturnal dyspnea.  Patient  has a pacemaker has a history of atrial fibrillation on chronic anticoagulation therapy Gastro intestinal system: No heartburn.  No nausea or vomiting.  No abdominal pain.  No diarrhea.  No constipation.  No rectal bleeding. Skin: No evidence of ecchymosis or rash. Genitourinary system: No dysuria.  No hematuria.  No frequency.  No flank pain. Neurological system: No dizziness.  No tingling.  His was.  no tingling numbness.  no focal weakness or any focal signs. Psychiatric system: Patient is rvery emotional and crying after hearing state news of stage IV carcinoma of lung As per HPI. Otherwise, a complete review of systems is negatve.  PAST MEDICAL HISTORY: Past Medical History  Diagnosis Date  . CHF (congestive heart failure)   . Diabetes mellitus without complication   . Hypertension   . Presence of permanent cardiac pacemaker   . Dysrhythmia     afib  . Depression   . GERD (gastroesophageal reflux disease)   . Coronary artery disease   . Cancer     breast,uterine  . COPD (chronic obstructive pulmonary disease)   . Arthritis   . Anemia   . Anginal pain   . Cancer of upper lobe of right lung 11/03/2014      Surgical History            Diabetes controlled with oral medication                                Previous history of congestive heart                               failure                               Coronary artery disease with stent                               placement and bypass surgery Hypertension                               Osteoarthritis                               Colon polyps                               Chronic low back pain                                                              Past Surgical History:                               Coronary bypass surgery and stent                               placement  Pacemaker because of atrial fibrillation                               Appendectomy                               Hemorrhoidectomy                                                              Family History:                               Family history of breast cancer and colon                               cancer. History of anemia, diabetes, heart                               disease, and hypertension in the family.                                                              Social History:                               Does not smoke now. Does not drink. Used                               to smoke in the pa ADVANCED DIRECTIVES:  No flowsheet data found.  HEALTH MAINTENANCE: History  Substance Use Topics  . Smoking status: Former Smoker    Quit date: 10/21/1997  . Smokeless tobacco: Never Used  . Alcohol Use: No      Allergies  Allergen Reactions  . Amoxicillin-Pot Clavulanate Hives and Diarrhea  . Codeine Nausea And Vomiting and Other (See Comments)    Other Reaction: "very ill"  . Latex Rash  . Other Rash    Current Outpatient Prescriptions  Medication Sig Dispense Refill  . albuterol (PROAIR HFA) 108 (90 BASE) MCG/ACT inhaler Inhale into the lungs.    . ALPRAZolam (XANAX) 0.5 MG tablet Take 0.5 mg by mouth 3 (three) times daily as needed for anxiety.     Marland Kitchen aspirin 81 MG  tablet Take 81 mg by mouth daily.     . Biotin 1000 MCG tablet Take 1,000 mcg by mouth daily.    . cefUROXime (CEFTIN) 250 MG tablet Take by mouth.    . cyclobenzaprine (FLEXERIL) 5 MG tablet Take 5 mg by mouth daily as needed for muscle spasms.     . dabigatran (PRADAXA) 150 MG CAPS capsule TAKE ONE CAPSULE BY MOUTH TWICE DAILY    . dicyclomine (BENTYL) 10 MG capsule Take 10 mg by mouth 3 (three) times daily  as needed (abdominal pain).     . furosemide (LASIX) 40 MG tablet Take 40 mg by mouth daily.     Marland Kitchen gabapentin (NEURONTIN) 100 MG capsule Take 100 mg by mouth every morning.     Marland Kitchen glipiZIDE (GLUCOTROL) 5 MG tablet Take 5 mg by mouth 2 (two) times daily.     . isosorbide mononitrate (IMDUR) 60 MG 24 hr tablet Take 60 mg by mouth daily.     Marland Kitchen levothyroxine (SYNTHROID, LEVOTHROID) 25 MCG tablet Take 25 mcg by mouth daily before breakfast.     . Liraglutide 18 MG/3ML SOPN Inject 3 mLs into the skin daily.     Marland Kitchen losartan (COZAAR) 100 MG tablet Take 100 mg by mouth daily.     . Magnesium-Calcium-Folic Acid 267-124-5 MG TABS Take 1 tablet by mouth daily.     . metFORMIN (GLUCOPHAGE) 1000 MG tablet Take 1,000 mg by mouth 2 (two) times daily.     . metoprolol-hydrochlorothiazide (LOPRESSOR HCT) 50-25 MG per tablet Take 1 tablet by mouth daily.     . nitroGLYCERIN (NITROSTAT) 0.4 MG SL tablet Place 0.4 mg under the tongue every 5 (five) minutes as needed for chest pain.     . potassium chloride (K-DUR) 10 MEQ tablet Take 10 mEq by mouth 2 (two) times daily.     . RABEprazole (ACIPHEX) 20 MG tablet Take 20 mg by mouth daily.     . simvastatin (ZOCOR) 20 MG tablet Take 20 mg by mouth every evening.     Marland Kitchen glipiZIDE (GLUCOTROL) 10 MG tablet   0  . lidocaine-prilocaine (EMLA) cream Apply 1 application topically as needed. 30 g 3   No current facility-administered medications for this visit.    OBJECTIVE:  Filed Vitals:   10/31/14 1102  BP: 126/73  Pulse: 87  Temp: 97.2 F (36.2 C)     Body  mass index is 31.6 kg/(m^2).    ECOG FS:1 - Symptomatic but completely ambulatory  PHYSICAL EXAM: General  status: Performance status is good.  Patient has not lost significant weight HEENT: No evidence of stomatitis. Sclera and conjunctivae :: No jaundice.   pale looking. Lungs: Air  entry equal on both sides.  No rhonchi.  No rales.  Cardiac: Heart sounds are normal.  No pericardial rub.  No murmur. Lymphatic system: Cervical, axillary, inguinal, lymph nodes not palpable GI: Abdomen is soft.  No ascites.  Liver spleen not palpable.  No tenderness.  Bowel sounds are within normal limit Lower extremity: No edema Neurological system: Higher functions, cranial nerves intact no evidence of peripheral neuropathy. Skin: No rash.  No ecchymosis.. Examination of both breasts.  Patient had down previous history of carcinoma of left breast no palpable masses axillary lymph nodes are normal.  Right breast free of masses.    LAB RESULTS:  No visits with results within 2 Day(s) from this visit. Latest known visit with results is:  Admission on 10/24/2014, Discharged on 10/24/2014  Component Date Value Ref Range Status  . Glucose-Capillary 10/24/2014 156* 65 - 99 mg/dL Final  . CYTOLOGY - NON GYN 10/24/2014    Final                   Value:Cytology - Non PAP CASE: [id number] PATIENT: Shaquina Cowley Non-Gyn Cytology Report     SPECIMEN SUBMITTED: A. Hilar lymph node 10R, EBUS  CLINICAL HISTORY: 81 yo with hx of breast CA, now with new lung mass  PRE-OPERATIVE DIAGNOSIS: None Provided  POST-OPERATIVE  DIAGNOSIS: Malignancy     DIAGNOSIS: A. HILAR LYMPH NODE, 10R; ENDOBRONCHIAL ULTRASOUND-GUIDED FNA: - METASTATIC SMALL CELL CARCINOMA.  Comment: The sample contains small cells with very high nuclear/cytoplasmic ratio, distributed as loose clusters and single cells. Immunohistochemistry (IHC) was performed and the majority of neoplastic cells display strong staining for  cytokeratin (AE1/AE3/Cam5.2), TTF-1, and CD56. The cytologic appearance and IHC support the above diagnosis.  The report was called to Dr Oliva Bustard on 10/29/14 at 2;50 PM.  Material reviewed: Diff-Quik and Pap-stained smears; needle rinse ThinPrep; cell block hematoxylin and eosin slides and immunohistochemical stains.                            GROSS DESCRIPTION: Site: "subcarina 7R", correct site 10R obtained from Dr Stevenson Clinch on 10/26/14. Procedure: EBUS Cytotechnologist: Rivka Barbara and Ashlee Howze Specimen(s) collected: 3 Diff Quik stained slides 3 Pap stained slides Specimen labeled with the patient's name and medical record number, rinse in CytoLyt for cell block and ThinPrep : Description: cloudy CytoLyt solution containing a 0.8 x 0.1 x 0.1 cm aggregate of pink to tan tissue fragments, submitted for: ThinPrep and Cell block: 1 Specimen in RPMI for flow cytometry, flow cytometry canceled by Dr. Dicie Beam Please see separate report [phone] for the right mainstem forceps biopsy result. Final Diagnosis performed by Bryan Lemma, MD.  Electronically signed 10/29/2014 3:07:39PM    The electronic signature indicates that the named Attending Pathologist has evaluated the specimen  Technical component performed at [facility], [address], [place], [place] [address] Lab: [phone] Dir: Darrick Penna. Margaretann Loveless, MD  Professional component performed at [facility], [facility], [place], [place], [place] [address] Lab: [phone] Dir: Dellia Nims. Reuel Derby, MD    . SURGICAL PATHOLOGY 10/24/2014    Final                   Value:Surgical Pathology CASE: [id number] PATIENT: Carynn Cone Surgical Pathology Report     SPECIMEN SUBMITTED: A. Lung, right mainstem, forceps biopsy  CLINICAL HISTORY: 81 yo with hx of breast ca, now with lung mass  PRE-OPERATIVE DIAGNOSIS: None Provided  POST-OPERATIVE  DIAGNOSIS: Malignancy     DIAGNOSIS: A. LUNG, RIGHT MAINSTEM BRONCHUS; FORCEPS BIOPSY: - NEGATIVE FOR MALIGNANCY.  Comment: The specimen consists of bronchial mucosa, submucosa, and smooth muscle. No active inflammatory process, epithelial atypia, or malignancy is identified.   GROSS DESCRIPTION: A. Labeled: patient's name and medical record number and formalin biopsy Tissue Fragment(s): 2 Measurement: 0.2 x 0.1 by less than 0.1 cm and 1.1 x 0.1 x 0.1 cm Comment: Tan tissue fragments, placed in lens paper, marked with eosin Touch imprints prepared: 1 Diff Quik and 1 Pap stained Entirely submitted in cassette(s): 1   Final Diagnosis performed by Bryan Lemma, MD.  Electroni                         cally signed 10/26/2014 2:55:38PM    The electronic signature indicates that the named Attending Pathologist has evaluated the specimen  Technical component performed at [facility], [address] [place], [place] [address] Lab: [phone] Dir: Darrick Penna. Evette Doffing, MD  Professional component performed at [facility], [facility], [place], [place], [place] [address] Lab: [phone] Dir: Dellia Nims. Reuel Derby, MD    .  Glucose-Capillary 10/24/2014 169* 65 - 99 mg/dL Final      STUDIES: Ct Chest W Contrast  10/09/2014   CLINICAL DATA:  Followup lung nodule identified on outside chest x-ray 09/13/2014. Current history of diabetes, hypertension. Prior history of left breast cancer post lumpectomy in 2010. Indwelling transvenous pacemaker.  EXAM: CT CHEST WITH CONTRAST  TECHNIQUE: Multidetector CT imaging of the chest was performed during intravenous contrast administration.  CONTRAST:  73m OMNIPAQUE IOHEXOL 300 MG/ML IV.  COMPARISON:  CTA chest 07/16/2008. Radiation therapy treatment planning CT 12/01/2006. The recent prior outside chest x-ray is not available for direct comparison.  FINDINGS: Contiguous adjacent masses in the right upper lobe, with a linear  communication between them, possibly a segmental bronchus filled with soft tissue or mucous. The more central mass measures approximately 2.2 x 1.2 x 2.0 cm (series 3, image 16 and coronal image 72). The more peripheral, pleural-based mass measures approximately 4.0 x 3.5 x 2.6 cm (series 3, image 13 coronal image 70). There are numerous small nodules scattered throughout both lungs, an index nodule deep in the right lower lobe at the diaphragm measures approximately 9 mm (series 3, image 42). No pleural effusions.  Low right paratracheal (station 4R) lymphadenopathy, the nodal mass measuring approximately 3.8 x 3.0 x 3.6 cm (series 2, image 16 and coronal image 77). Right hilar lymphadenopathy, contiguous with the right paratracheal lymphadenopathy, measuring approximately 3.4 x 3.1 x 2.6 cm (series 2, image 21 and coronal image 79). Subcentimeter upper right paratracheal (station to our) lymph node. Left supraclavicular lymphadenopathy, incompletely imaged.  Heart moderately to markedly enlarged with biatrial enlargement. Transvenous pacemaker lead tips at the right atrial appendage and RV apex. Prior sternotomy for CABG. Severe atherosclerosis involving the thoracic and upper abdominal aorta without aneurysm or dissection. Atherosclerosis involving the proximal great vessels without visible stenosis.  Moderate-sized hiatal hernia, unchanged since the prior CT. Left adrenal nodule, unchanged since the prior CT. Surgically absent gallbladder. Visualized upper abdomen otherwise unremarkable. Bone window images demonstrate degenerative disc disease and spondylosis throughout the thoracic spine, a bone island in the T6 vertebral body, degenerative changes involving the lower cervical spine, and exaggeration of the usual thoracic kyphosis, but no definite osseous metastatic disease.  Postsurgical changes related to the prior left breast lumpectomy are noted.  IMPRESSION: 1. Contiguous adjacent masses involving the  right upper lobe, measured above. As there is a linear communication between the 2 masses, endobronchial tumor within a segmental right upper lobe branch is suspected. 2. Right paratracheal and right hilar lymphadenopathy, measured above. Left supraclavicular lymphadenopathy is incompletely imaged. 3. Numerous bilateral pulmonary metastases. 4. Cardiomegaly with biatrial enlargement.  Prior CABG. 5. Stable moderate-sized hiatal hernia. 6. Stable left adrenal adenoma. 7. No visible osseous metastases. 8. Postsurgical changes involving the left breast at the site of prior lumpectomy. The above findings are felt to more likely represent a primary lung malignancy with metastases rather than metastatic disease from the breast cancer.   Electronically Signed   By: TEvangeline DakinM.D.   On: 10/09/2014 17:43   Ct Abdomen Pelvis W Contrast  10/26/2014   CLINICAL DATA:  Follow-up chest CT per patient. Cough and wheezing. Lung nodule identified on outside chest x-ray 09/13/2014. Current history of diabetes, hypertension. Prior history of left breast cancer post lumpectomy in 2010. Indwelling transvenous pacemaker.  EXAM: CT ABDOMEN AND PELVIS WITH CONTRAST  TECHNIQUE: Multidetector CT imaging of the abdomen and pelvis was performed using the standard protocol following bolus administration of  intravenous contrast.  CONTRAST:  15m OMNIPAQUE IOHEXOL 300 MG/ML  SOLN  COMPARISON:  Chest CT 10/09/2014. Abdominal CT 09/29/2013 and 09/04/2013.  FINDINGS: Lower chest: Cardiomegaly, coronary artery calcifications and pacemaker again noted. There is a moderate size hiatal hernia which appears unchanged. No significant pleural effusion. Right lower lobe pulmonary nodules again noted, measuring up to 9 mm on image 6. These are new from abdominal CT done last year. There is increased adjacent patchy right lower lobe airspace disease. The left lung base appears clear.  Hepatobiliary: Portal phase images through the liver were obtained  prior to opacification of the hepatic veins. Nevertheless, there are multiple ill-defined hepatic lesions which are highly worrisome for metastases (best seen on series 4). The largest measures at least 2.1 cm in the posterior segment of the right lobe (image 16 of series 4). No significant biliary dilatation status post cholecystectomy.  Pancreas: Unremarkable. No pancreatic ductal dilatation or surrounding inflammatory changes.  Spleen: Normal in size without focal abnormality.  Adrenals/Urinary Tract: Stable small left adrenal nodule consistent with an adenoma based on stability. The right adrenal gland appears normal.There is stable mild renal cortical thinning, cyst formation and a nonobstructing calculus in the right kidney. The left kidney appears normal. No evidence of hydronephrosis. No focal bladder abnormality seen. Pelvic floor laxity likely.  Stomach/Bowel: Moderate size hiatal hernia. No evidence of bowel wall thickening, distension or suspicious lesion. There is a probable lipoma within the duodenum on image 38.No ascites or peritoneal nodularity.  Vascular/Lymphatic: There are no enlarged abdominal or pelvic lymph nodes. There are stable probable varicosities and phleboliths anterior to the right psoas muscle. Atherosclerosis of the aorta, its branches and the iliac arteries appears unchanged.  Reproductive: Previous hysterectomy.  No evidence of adnexal mass.  Other: No evidence of abdominal wall mass or hernia.  Musculoskeletal: No acute or significant osseous findings. Degenerative changes noted throughout the spine.  IMPRESSION: 1. Right basilar pulmonary nodules as demonstrated on recent chest CT, suspicious for metastatic disease. 2. Multiple hepatic lesions worrisome for metastases. No other evidence of metastatic disease within the abdomen or pelvis. 3. No primary malignancy identified within the abdomen or pelvis. 4. Increased patchy right basilar airspace disease may reflect atelectasis or  sequela of recent aspiration.   Electronically Signed   By: WRichardean SaleM.D.   On: 10/26/2014 16:19    ASSESSMENT: 1.  Right upper lobe carcinoma of lung.  Biopsy is positive for small cell undifferentiated tumor CT scan of the abdomen shows multiple liver metastases Stage IV disease  MEDICAL DECISION MAKING:  Pathology has been reviewed.  CT scan ABDOMEN has been reviewed independently.  I had prolonged discussion with patient and family regarding diagnosis prognosis and treatment options.  Patient will need MRI scan of brain for further staging workup as this is small cell undifferentiated carcinoma of lung We discuss chemotherapy options with carboplatinum VP-16 Intent of the treatment is palliation All the side effects of chemotherapy including myelosuppression, alopecia, nausea vomiting fatigue weakness.  Secondary infection, and   peripheral neuropathy .  Has been discussed in details. Informal consent has been obtained and will be documented by nurses in the chart Patient will need placement as well as chemotherapy class and approval from insurance Patient and family were present.  During the discuss and had a number of questions.     hours  Patient expressed understanding and was in agreement with this plan. She also understands that She can call clinic at any time  with any questions, concerns, or complaints.    No matching staging information was found for the patient.  Forest Gleason, MD   11/03/2014 9:49 AM

## 2014-11-05 ENCOUNTER — Encounter
Admission: RE | Disposition: A | Discharge status: Home / Self Care | Payer: Self-pay | Source: Ambulatory Visit | Attending: Vascular Surgery

## 2014-11-05 ENCOUNTER — Ambulatory Visit
Admission: RE | Admit: 2014-11-05 | Discharge: 2014-11-05 | Disposition: A | Discharge status: Home / Self Care | Payer: Medicare Other | Source: Ambulatory Visit | Attending: Vascular Surgery | Admitting: Vascular Surgery

## 2014-11-05 ENCOUNTER — Encounter: Payer: Self-pay | Admitting: *Deleted

## 2014-11-05 ENCOUNTER — Other Ambulatory Visit: Payer: Self-pay | Admitting: *Deleted

## 2014-11-05 DIAGNOSIS — K449 Diaphragmatic hernia without obstruction or gangrene: Secondary | ICD-10-CM | POA: Insufficient documentation

## 2014-11-05 DIAGNOSIS — C50919 Malignant neoplasm of unspecified site of unspecified female breast: Secondary | ICD-10-CM | POA: Insufficient documentation

## 2014-11-05 DIAGNOSIS — D3502 Benign neoplasm of left adrenal gland: Secondary | ICD-10-CM | POA: Insufficient documentation

## 2014-11-05 DIAGNOSIS — C3411 Malignant neoplasm of upper lobe, right bronchus or lung: Secondary | ICD-10-CM

## 2014-11-05 DIAGNOSIS — I517 Cardiomegaly: Secondary | ICD-10-CM | POA: Insufficient documentation

## 2014-11-05 DIAGNOSIS — R591 Generalized enlarged lymph nodes: Secondary | ICD-10-CM | POA: Insufficient documentation

## 2014-11-05 HISTORY — PX: PERIPHERAL VASCULAR CATHETERIZATION: SHX172C

## 2014-11-05 LAB — GLUCOSE, CAPILLARY: GLUCOSE-CAPILLARY: 174 mg/dL — AB (ref 65–99)

## 2014-11-05 SURGERY — PORTA CATH INSERTION
Anesthesia: Moderate Sedation

## 2014-11-05 MED ORDER — SODIUM CHLORIDE 0.9 % IV SOLN
INTRAVENOUS | Status: DC
  Administered 2014-11-05: 09:00:00 via INTRAVENOUS

## 2014-11-05 MED ORDER — FENTANYL CITRATE (PF) 100 MCG/2ML IJ SOLN
INTRAMUSCULAR | Status: AC
  Filled 2014-11-05: qty 2

## 2014-11-05 MED ORDER — FENTANYL CITRATE (PF) 100 MCG/2ML IJ SOLN
INTRAMUSCULAR | Status: DC | PRN
  Administered 2014-11-05 (×2): 50 ug via INTRAVENOUS

## 2014-11-05 MED ORDER — CLINDAMYCIN PHOSPHATE 300 MG/50ML IV SOLN
INTRAVENOUS | Status: AC
  Filled 2014-11-05: qty 50

## 2014-11-05 MED ORDER — HEPARIN (PORCINE) IN NACL 2-0.9 UNIT/ML-% IJ SOLN
INTRAMUSCULAR | Status: AC
  Filled 2014-11-05: qty 500

## 2014-11-05 MED ORDER — IOHEXOL 300 MG/ML  SOLN
INTRAMUSCULAR | Status: DC | PRN
  Administered 2014-11-05: 5 mL via INTRAVENOUS

## 2014-11-05 MED ORDER — MIDAZOLAM HCL 2 MG/2ML IJ SOLN
INTRAMUSCULAR | Status: DC | PRN
  Administered 2014-11-05: 2 mg via INTRAVENOUS
  Administered 2014-11-05 (×2): 1 mg via INTRAVENOUS

## 2014-11-05 MED ORDER — DIPHENHYDRAMINE HCL 50 MG/ML IJ SOLN
25.0000 mg | Freq: Once | INTRAMUSCULAR | Status: AC
  Administered 2014-11-05: 25 mg via INTRAVENOUS

## 2014-11-05 MED ORDER — MIDAZOLAM HCL 5 MG/5ML IJ SOLN
INTRAMUSCULAR | Status: AC
  Filled 2014-11-05: qty 5

## 2014-11-05 MED ORDER — SODIUM CHLORIDE 0.9 % IR SOLN
Freq: Once | Status: AC
  Administered 2014-11-05: 10:00:00
  Filled 2014-11-05: qty 2

## 2014-11-05 MED ORDER — CEFAZOLIN SODIUM 1-5 GM-% IV SOLN
INTRAVENOUS | Status: AC
  Filled 2014-11-05: qty 50

## 2014-11-05 MED ORDER — LIDOCAINE-EPINEPHRINE (PF) 1 %-1:200000 IJ SOLN
INTRAMUSCULAR | Status: AC
Start: 2014-11-05 — End: 2014-11-05
  Filled 2014-11-05: qty 30

## 2014-11-05 MED ORDER — CLINDAMYCIN PHOSPHATE 300 MG/50ML IV SOLN
300.0000 mg | Freq: Once | INTRAVENOUS | Status: AC
  Administered 2014-11-05: 09:00:00 via INTRAVENOUS

## 2014-11-05 MED ORDER — DIPHENHYDRAMINE HCL 50 MG/ML IJ SOLN
INTRAMUSCULAR | Status: AC
  Filled 2014-11-05: qty 1

## 2014-11-05 SURGICAL SUPPLY — 10 items
CANISTER SUCT 3000ML PPV (MISCELLANEOUS) ×3 IMPLANT
DERMABOND ADVANCED (GAUZE/BANDAGES/DRESSINGS) ×2
DERMABOND ADVANCED .7 DNX12 (GAUZE/BANDAGES/DRESSINGS) ×1 IMPLANT
ELECT CAUTERY BLADE 6.4 (BLADE) ×3 IMPLANT
GLIDEWIRE STIFF .35X180X3 HYDR (WIRE) ×3 IMPLANT
HANDLE YANKAUER SUCT BULB TIP (MISCELLANEOUS) ×3 IMPLANT
PACK ANGIOGRAPHY (CUSTOM PROCEDURE TRAY) ×3 IMPLANT
PAD GROUND ADULT SPLIT (MISCELLANEOUS) ×3 IMPLANT
PORTACATH POWER 8F (Port) ×3 IMPLANT
TOWEL OR 17X26 4PK STRL BLUE (TOWEL DISPOSABLE) ×3 IMPLANT

## 2014-11-05 NOTE — H&P (Signed)
[facility] History & Physical Update  The patient was interviewed and re-examined.  The patient's previous History and Physical has been reviewed and is unchanged.  There is no change in the plan of care.  Connor Meacham, MD  11/05/2014, 9:17 AM

## 2014-11-05 NOTE — Op Note (Signed)
      [facility]       Operative Note  Date: 11/05/2014  Preoperative diagnosis:  1. Breast cancer  Postoperative diagnosis:  Same as above  Procedures: #1. Ultrasound guidance for vascular access to the right internal jugular vein. #2. Fluoroscopic guidance for placement of catheter. #3. Placement of CT compatible Port-A-Cath, right internal jugular vein. #4. Right jugular venogram  Surgeon: Leotis Pain, MD.   Anesthesia: Local with moderate conscious sedation.  Fluoroscopy time: 2 minutes  Contrast used: 5 cc  Estimated blood loss: 25cc  Indication for the procedure:  Patient with multiple medical issues and a history of breast cancer.  The patient needs a Port-A-Cath for durable venous access, chemotherapy, lab draws, and CT scans. We are asked to place this. Risks and benefits were discussed and informed consent was obtained.  Description of procedure: The patient was brought to the vascular and interventional radiology suite. The right neck chest and shoulder were sterilely prepped and draped, and a sterile surgical field was created. Ultrasound was used to help visualize a patent right internal jugular vein. This was then accessed under direct ultrasound guidance without difficulty with the Seldinger needle and a permanent image was recorded. A J-wire would not pass despite multiple efforts, and a needle venogram was performed on the right jugular vein.  This was found to be tortuous at the clavicle, but patent.  Under fluorocoscopic guidance, I was able to traverse a glide wire through the tortuous jugular vein.  After skin nick and dilatation, the peel-away sheath was then placed over the wire. I then anesthetized an area under the clavicle approximately 1-2 fingerbreadths. A transverse incision was created and an inferior pocket was created with electrocautery and blunt dissection. The port was then brought onto the field, placed into the pocket and secured to  the chest wall with 2 Prolene sutures. The catheter was connected to the port and tunneled from the subclavicular incision to the access site. Fluoroscopic guidance was then used to cut the catheter to an appropriate length. The catheter was then placed through the peel-away sheath and the peel-away sheath was removed. The catheter tip was parked in excellent location under fluorocoscopic guidance in the cavoatrial junction. The pocket was then irrigated with antibiotic impregnated saline and the wound was closed with a running 3-0 Vicryl and a 4-0 Monocryl. The access incision was closed with a single 4-0 Monocryl. The Huber needle was used to withdraw blood and flush the port with heparinized saline. Dermabond was then placed as a dressing. The patient tolerated the procedure well and was taken to the recovery room in stable condition.   DEW,JASON 11/05/2014 9:52 AM

## 2014-11-05 NOTE — Discharge Instructions (Signed)
Implanted Port Insertion, Care After Refer to this sheet in the next few weeks. These instructions provide you with information on caring for yourself after your procedure. Your health care provider may also give you more specific instructions. Your treatment has been planned according to current medical practices, but problems sometimes occur. Call your health care provider if you have any problems or questions after your procedure. WHAT TO EXPECT AFTER THE PROCEDURE After your procedure, it is typical to have the following:   Discomfort at the port insertion site. Ice packs to the area will help.  Bruising on the skin over the port. This will subside in 3-4 days. HOME CARE INSTRUCTIONS  After your port is placed, you will get a manufacturer's information card. The card has information about your port. Keep this card with you at all times.   Know what kind of port you have. There are many types of ports available.   Wear a medical alert bracelet in case of an emergency. This can help alert health care workers that you have a port.   The port can stay in for as long as your health care provider believes it is necessary.   A home health care nurse may give medicines and take care of the port.   You or a family member can get special training and directions for giving medicine and taking care of the port at home.  SEEK MEDICAL CARE IF:   Your port does not flush or you are unable to get a blood return.   You have a fever or chills. SEEK IMMEDIATE MEDICAL CARE IF:  You have new fluid or pus coming from your incision.   You notice a bad smell coming from your incision site.   You have swelling, pain, or more redness at the incision or port site.   You have chest pain or shortness of breath. Document Released: 02/15/2013 Document Revised: 05/02/2013 Document Reviewed: 02/15/2013 ExitCare Patient Information 2015 [employer]. This information is not intended to replace  advice given to you by your health care provider. Make sure you discuss any questions you have with your health care provider.  

## 2014-11-06 ENCOUNTER — Ambulatory Visit
Admission: RE | Admit: 2014-11-06 | Discharge: 2014-11-06 | Disposition: A | Discharge status: Home / Self Care | Payer: Medicare Other | Source: Ambulatory Visit | Attending: Oncology | Admitting: Oncology

## 2014-11-06 ENCOUNTER — Encounter: Payer: Self-pay | Admitting: Vascular Surgery

## 2014-11-06 DIAGNOSIS — C3411 Malignant neoplasm of upper lobe, right bronchus or lung: Secondary | ICD-10-CM | POA: Diagnosis not present

## 2014-11-06 MED ORDER — IOHEXOL 300 MG/ML  SOLN
75.0000 mL | Freq: Once | INTRAMUSCULAR | Status: AC | PRN
  Administered 2014-11-06: 75 mL via INTRAVENOUS

## 2014-11-09 ENCOUNTER — Ambulatory Visit: Admission: RE | Admit: 2014-11-09 | Payer: Medicare Other | Source: Ambulatory Visit

## 2014-11-09 ENCOUNTER — Other Ambulatory Visit: Payer: Medicare Other

## 2014-11-16 ENCOUNTER — Other Ambulatory Visit: Payer: Self-pay | Admitting: *Deleted

## 2014-11-16 ENCOUNTER — Telehealth: Payer: Self-pay | Admitting: *Deleted

## 2014-11-16 DIAGNOSIS — C3411 Malignant neoplasm of upper lobe, right bronchus or lung: Secondary | ICD-10-CM

## 2014-11-16 DIAGNOSIS — R918 Other nonspecific abnormal finding of lung field: Secondary | ICD-10-CM

## 2014-11-16 MED ORDER — LIDOCAINE-PRILOCAINE 2.5-2.5 % EX CREA: 1.0000 "application " | TOPICAL_CREAM | CUTANEOUS | Status: DC | PRN

## 2014-11-16 NOTE — Addendum Note (Signed)
Addended by: Betti Cruz on: 11/16/2014 11:52 AM   Modules accepted: Orders

## 2014-11-16 NOTE — Telephone Encounter (Signed)
Escribed

## 2014-11-20 ENCOUNTER — Inpatient Hospital Stay: Payer: Medicare Other | Attending: Oncology | Admitting: Oncology

## 2014-11-20 ENCOUNTER — Telehealth: Payer: Self-pay | Admitting: *Deleted

## 2014-11-20 ENCOUNTER — Encounter: Payer: Self-pay | Admitting: Oncology

## 2014-11-20 ENCOUNTER — Inpatient Hospital Stay: Payer: Medicare Other

## 2014-11-20 VITALS — BP 121/77 | HR 80 | Temp 97.0°F

## 2014-11-20 VITALS — BP 116/75 | HR 86 | Temp 96.6°F | Wt 177.5 lb

## 2014-11-20 DIAGNOSIS — C3411 Malignant neoplasm of upper lobe, right bronchus or lung: Secondary | ICD-10-CM

## 2014-11-20 DIAGNOSIS — Z923 Personal history of irradiation: Secondary | ICD-10-CM | POA: Diagnosis not present

## 2014-11-20 DIAGNOSIS — Z7982 Long term (current) use of aspirin: Secondary | ICD-10-CM | POA: Diagnosis not present

## 2014-11-20 DIAGNOSIS — C7982 Secondary malignant neoplasm of genital organs: Secondary | ICD-10-CM

## 2014-11-20 DIAGNOSIS — Z950 Presence of cardiac pacemaker: Secondary | ICD-10-CM | POA: Diagnosis not present

## 2014-11-20 DIAGNOSIS — K449 Diaphragmatic hernia without obstruction or gangrene: Secondary | ICD-10-CM

## 2014-11-20 DIAGNOSIS — E119 Type 2 diabetes mellitus without complications: Secondary | ICD-10-CM | POA: Insufficient documentation

## 2014-11-20 DIAGNOSIS — I509 Heart failure, unspecified: Secondary | ICD-10-CM | POA: Diagnosis not present

## 2014-11-20 DIAGNOSIS — Z853 Personal history of malignant neoplasm of breast: Secondary | ICD-10-CM | POA: Diagnosis not present

## 2014-11-20 DIAGNOSIS — Z79899 Other long term (current) drug therapy: Secondary | ICD-10-CM | POA: Diagnosis not present

## 2014-11-20 DIAGNOSIS — K219 Gastro-esophageal reflux disease without esophagitis: Secondary | ICD-10-CM | POA: Insufficient documentation

## 2014-11-20 DIAGNOSIS — G319 Degenerative disease of nervous system, unspecified: Secondary | ICD-10-CM | POA: Diagnosis not present

## 2014-11-20 DIAGNOSIS — M129 Arthropathy, unspecified: Secondary | ICD-10-CM | POA: Diagnosis not present

## 2014-11-20 DIAGNOSIS — F329 Major depressive disorder, single episode, unspecified: Secondary | ICD-10-CM | POA: Diagnosis not present

## 2014-11-20 DIAGNOSIS — Z5111 Encounter for antineoplastic chemotherapy: Secondary | ICD-10-CM | POA: Insufficient documentation

## 2014-11-20 DIAGNOSIS — I2510 Atherosclerotic heart disease of native coronary artery without angina pectoris: Secondary | ICD-10-CM | POA: Diagnosis not present

## 2014-11-20 DIAGNOSIS — J449 Chronic obstructive pulmonary disease, unspecified: Secondary | ICD-10-CM | POA: Diagnosis not present

## 2014-11-20 DIAGNOSIS — I10 Essential (primary) hypertension: Secondary | ICD-10-CM

## 2014-11-20 DIAGNOSIS — C787 Secondary malignant neoplasm of liver and intrahepatic bile duct: Secondary | ICD-10-CM | POA: Insufficient documentation

## 2014-11-20 LAB — COMPREHENSIVE METABOLIC PANEL
ALK PHOS: 101 U/L (ref 38–126)
ALT: 20 U/L (ref 14–54)
ANION GAP: 11 (ref 5–15)
AST: 36 U/L (ref 15–41)
Albumin: 4 g/dL (ref 3.5–5.0)
BUN: 33 mg/dL — AB (ref 6–20)
CALCIUM: 8.5 mg/dL — AB (ref 8.9–10.3)
CHLORIDE: 98 mmol/L — AB (ref 101–111)
CO2: 26 mmol/L (ref 22–32)
Creatinine, Ser: 1.22 mg/dL — ABNORMAL HIGH (ref 0.44–1.00)
GFR calc Af Amer: 48 mL/min — ABNORMAL LOW (ref >60)
GFR calc non Af Amer: 41 mL/min — ABNORMAL LOW (ref >60)
Glucose, Bld: 227 mg/dL — ABNORMAL HIGH (ref 65–99)
Potassium: 3.6 mmol/L (ref 3.5–5.1)
SODIUM: 135 mmol/L (ref 135–145)
TOTAL PROTEIN: 7 g/dL (ref 6.5–8.1)
Total Bilirubin: 1 mg/dL (ref 0.3–1.2)

## 2014-11-20 LAB — CBC WITH DIFFERENTIAL/PLATELET
BASOS PCT: 1 %
Basophils Absolute: 0.1 10*3/uL (ref 0–0.1)
Eosinophils Absolute: 0.1 10*3/uL (ref 0–0.7)
Eosinophils Relative: 1 %
HCT: 34.2 % — ABNORMAL LOW (ref 35.0–47.0)
Hemoglobin: 10.6 g/dL — ABNORMAL LOW (ref 12.0–16.0)
LYMPHS PCT: 16 %
Lymphs Abs: 1.2 10*3/uL (ref 1.0–3.6)
MCH: 22.7 pg — ABNORMAL LOW (ref 26.0–34.0)
MCHC: 30.9 g/dL — ABNORMAL LOW (ref 32.0–36.0)
MCV: 73.7 fL — AB (ref 80.0–100.0)
MONO ABS: 0.5 10*3/uL (ref 0.2–0.9)
MONOS PCT: 7 %
NEUTROS ABS: 5.7 10*3/uL (ref 1.4–6.5)
NEUTROS PCT: 75 %
PLATELETS: 316 10*3/uL (ref 150–440)
RBC: 4.65 MIL/uL (ref 3.80–5.20)
RDW: 17.9 % — ABNORMAL HIGH (ref 11.5–14.5)
WBC: 7.5 10*3/uL (ref 3.6–11.0)

## 2014-11-20 MED ORDER — SODIUM CHLORIDE 0.9 % IV SOLN
Freq: Once | INTRAVENOUS | Status: AC
  Administered 2014-11-20: 11:00:00 via INTRAVENOUS
  Filled 2014-11-20: qty 5

## 2014-11-20 MED ORDER — PALONOSETRON HCL INJECTION 0.25 MG/5ML
0.2500 mg | Freq: Once | INTRAVENOUS | Status: AC
Start: 2014-11-20 — End: 2014-11-20
  Administered 2014-11-20: 0.25 mg via INTRAVENOUS
  Filled 2014-11-20: qty 5

## 2014-11-20 MED ORDER — SODIUM CHLORIDE 0.9 % IJ SOLN
10.0000 mL | INTRAMUSCULAR | Status: DC | PRN
  Filled 2014-11-20: qty 10

## 2014-11-20 MED ORDER — SODIUM CHLORIDE 0.9 % IV SOLN
Freq: Once | INTRAVENOUS | Status: AC
  Administered 2014-11-20: 11:00:00 via INTRAVENOUS
  Filled 2014-11-20: qty 1000

## 2014-11-20 MED ORDER — SODIUM CHLORIDE 0.9 % IV SOLN
360.0000 mg | Freq: Once | INTRAVENOUS | Status: AC
  Administered 2014-11-20: 360 mg via INTRAVENOUS
  Filled 2014-11-20: qty 36

## 2014-11-20 MED ORDER — SODIUM CHLORIDE 0.9 % IV SOLN
80.0000 mg/m2 | Freq: Once | INTRAVENOUS | Status: AC
  Administered 2014-11-20: 150 mg via INTRAVENOUS
  Filled 2014-11-20: qty 7.5

## 2014-11-20 MED ORDER — SODIUM CHLORIDE 0.9 % IJ SOLN
10.0000 mL | INTRAMUSCULAR | Status: AC | PRN
  Administered 2014-11-20: 10 mL via INTRAVENOUS
  Filled 2014-11-20: qty 10

## 2014-11-20 MED ORDER — HEPARIN SOD (PORK) LOCK FLUSH 100 UNIT/ML IV SOLN
500.0000 [IU] | Freq: Once | INTRAVENOUS | Status: AC | PRN
  Administered 2014-11-20: 500 [IU]
  Filled 2014-11-20: qty 5

## 2014-11-20 MED ORDER — ONDANSETRON HCL 8 MG PO TABS: 8.0000 mg | ORAL_TABLET | Freq: Two times a day (BID) | ORAL | Status: DC

## 2014-11-20 MED ORDER — HEPARIN SOD (PORK) LOCK FLUSH 100 UNIT/ML IV SOLN: 500.0000 [IU] | Freq: Once | INTRAVENOUS | Status: AC

## 2014-11-20 NOTE — Telephone Encounter (Signed)
I called Vladimir Faster to [facility] and sent it to [facility], I called pt and let her know that insurance is requiring a PA for the med

## 2014-11-20 NOTE — Progress Notes (Signed)
[place] @ [facility] [phone]  Fax:([phone]) [phone]     Wendy Giles OB: 10/18/1932  MR#: [id number]  DJM#:[id number]  Patient Care Team: Tracie Harrier, MD as PCP - General (Internal Medicine) Isaias Cowman, MD as Consulting Physician (Cardiology)  CHIEF COMPLAINT:  Chief Complaint  Patient presents with  . Follow-up       Carcinoma of breast                                AJCC Staging:  [id number]                               Stage Grouping: I                               Cancer Status: Evidence of disease.                               Estrogen receptor positive and                               progesterone receptor positive. HER- 2                               Negative. 2.  Abnormal CT scan of the chest (June, 2016)  Oncology History   1.  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  Previous history of carcinoma breast stage I disease 3.  Started on chemotherapy with carboplatinum and VP-16 from November 20, 2014 MRI scan of brain was negative (July, 2016)     Cancer of upper lobe of right lung   11/03/2014 Initial Diagnosis Cancer of upper lobe of right lung    Oncology Flowsheet 10/24/2014 11/20/2014  Day, Cycle - Day 1, Cycle 1  CARBOplatin (PARAPLATIN) IV - 360 mg  dexamethasone (DECADRON) IV - [ 12 mg ]  etoposide (VEPESID) IV - 80 mg/m2  fosaprepitant (EMEND) IV - [ 150 mg ]  ondansetron (ZOFRAN) IV - -  palonosetron (ALOXI) IV - 0.25 mg    INTERVAL HISTORY: 81 year old lady with a previous history of carcinoma of breast also patient is a remote smoker started having cough initially was thought due to acute bronchitis.  Chest x-ray was abnormal CT scan was obtained.  Which revealed right upper lobe lung mass and right hilar lymph node.  Patient is being referred to me for further  evaluation and treatment consideration. No hemoptysis.  No chest pain.  Appetite has been stable. In June, 2016 Patient is here for evaluation patient has undergone EBUS  and bronchoscopy.  Bronchoscopy specimen was negative however biopsy was consistent with small cell undifferentiated carcinoma of lung.  Patient's CT scan of the liver revealed multiple metastases in the liver.  The patient is here to discuss the results and the further planning of treatment Continues to have dry hacking cough.  Patient was started on prednisone without much relief in the cough.  No hemoptysis.  No chest pain.  No difficulty swallowing. July, 2016 Patient is here for  ongoing evaluation and initiation of chemotherapy.  Recent had attended chemotherapy class.  He had a port placement.  Patient has developed some swelling around the port area but there is a good blood flow. Family had number of questions regarding treatment REVIEW OF SYSTEMS:   Gen. status: Patient is extremely apprehensive and not in any acute distress.  Not lost any significant weight. Lungs: No chest pain.  No hemoptysis.  Dry hacking cough no difficulty swallowing HEENT: No headache.  No hearing loss.  No ear pain.  No nosebleed or congestion.  No sore throat.  No difficulty swallowing Cardiovascular system: No chest pain.  No palpitation.  No paroxysmal nocturnal dyspnea.  Patient has a pacemaker has a history of atrial fibrillation on chronic anticoagulation therapy Gastro intestinal system: No heartburn.  No nausea or vomiting.  No abdominal pain.  No diarrhea.  No constipation.  No rectal bleeding. Skin: No evidence of ecchymosis or rash. Genitourinary system: No dysuria.  No hematuria.  No frequency.  No flank pain. Neurological system: No dizziness.  No tingling.  His was.  no tingling numbness.  no focal weakness or any focal signs. Psychiatric system: Patient is rvery emotional and crying after hearing state news of stage IV carcinoma  of lung As per HPI. Otherwise, a complete review of systems is negatve.  PAST MEDICAL HISTORY: Past Medical History  Diagnosis Date  . CHF (congestive heart failure)   . Diabetes mellitus without complication   . Hypertension   . Presence of permanent cardiac pacemaker   . Dysrhythmia     afib  . Depression   . GERD (gastroesophageal reflux disease)   . Coronary artery disease   . Cancer     breast,uterine  . COPD (chronic obstructive pulmonary disease)   . Arthritis   . Anemia   . Anginal pain   . Cancer of upper lobe of right lung 11/03/2014      Surgical History            Diabetes controlled with oral medication                               Previous history of congestive heart                               failure                               Coronary artery disease with stent                               placement and bypass surgery Hypertension                               Osteoarthritis                               Colon polyps                               Chronic low back pain  Past Surgical History:                               Coronary bypass surgery and stent                               placement                                Pacemaker because of atrial fibrillation                               Appendectomy                               Hemorrhoidectomy                                                              Family History:                               Family history of breast cancer and colon                               cancer. History of anemia, diabetes, heart                               disease, and hypertension in the family.                                                              Social History:                               Does not smoke now. Does not drink. Used                               to smoke in the pa ADVANCED DIRECTIVES:  No flowsheet data  found.  HEALTH MAINTENANCE: History  Substance Use Topics  . Smoking status: Former Smoker    Quit date: 10/21/1997  . Smokeless tobacco: Never Used  . Alcohol Use: No      Allergies  Allergen Reactions  . Amoxicillin-Pot Clavulanate Hives and Diarrhea  . Fentanyl Itching  . Codeine Nausea And Vomiting and Other (See Comments)    Other Reaction: "very ill"  . Latex Rash  . Other Rash    Current Outpatient Prescriptions  Medication Sig Dispense Refill  . albuterol (PROAIR HFA) 108 (90 BASE) MCG/ACT inhaler Inhale into the lungs.    . ALPRAZolam (XANAX) 0.5 MG tablet Take 0.5 mg by mouth 3 (three)  times daily as needed for anxiety.     Marland Kitchen aspirin 81 MG tablet Take 81 mg by mouth daily.     . Biotin 1000 MCG tablet Take 1,000 mcg by mouth daily.    . cyclobenzaprine (FLEXERIL) 5 MG tablet Take 5 mg by mouth daily as needed for muscle spasms.     . dabigatran (PRADAXA) 150 MG CAPS capsule TAKE ONE CAPSULE BY MOUTH TWICE DAILY    . dicyclomine (BENTYL) 10 MG capsule Take 10 mg by mouth 3 (three) times daily as needed (abdominal pain).     . furosemide (LASIX) 40 MG tablet Take 40 mg by mouth daily.     Marland Kitchen gabapentin (NEURONTIN) 100 MG capsule Take 100 mg by mouth every morning.     Marland Kitchen glipiZIDE (GLUCOTROL) 10 MG tablet   0  . glipiZIDE (GLUCOTROL) 5 MG tablet Take 5 mg by mouth 2 (two) times daily.     . isosorbide mononitrate (IMDUR) 60 MG 24 hr tablet Take 60 mg by mouth daily.     Marland Kitchen levothyroxine (SYNTHROID, LEVOTHROID) 25 MCG tablet Take 25 mcg by mouth daily before breakfast.     . lidocaine-prilocaine (EMLA) cream Apply 1 application topically as needed. 30 g 3  . Liraglutide 18 MG/3ML SOPN Inject 3 mLs into the skin daily.     Marland Kitchen losartan (COZAAR) 100 MG tablet Take 100 mg by mouth daily.     . Magnesium-Calcium-Folic Acid 161-096-0 MG TABS Take 1 tablet by mouth daily.     . metFORMIN (GLUCOPHAGE) 1000 MG tablet Take 1,000 mg by mouth 2 (two) times daily.     .  metoprolol-hydrochlorothiazide (LOPRESSOR HCT) 50-25 MG per tablet Take 1 tablet by mouth daily.     . nitroGLYCERIN (NITROSTAT) 0.4 MG SL tablet Place 0.4 mg under the tongue every 5 (five) minutes as needed for chest pain.     . potassium chloride (K-DUR) 10 MEQ tablet Take 10 mEq by mouth 2 (two) times daily.     . RABEprazole (ACIPHEX) 20 MG tablet Take 20 mg by mouth daily.     . simvastatin (ZOCOR) 20 MG tablet Take 20 mg by mouth every evening.     . ondansetron (ZOFRAN) 8 MG tablet Take 1 tablet (8 mg total) by mouth 2 (two) times daily. Start the day after chemo for 3 days. Then take as needed for nausea or vomiting. 30 tablet 1   No current facility-administered medications for this visit.   Facility-Administered Medications Ordered in Other Visits  Medication Dose Route Frequency Provider Last Rate Last Dose  . heparin lock flush 100 unit/mL  500 Units Intravenous Once Forest Gleason, MD   500 Units at 11/20/14 1116  . sodium chloride 0.9 % injection 10 mL  10 mL Intravenous PRN Forest Gleason, MD   10 mL at 11/20/14 0916    OBJECTIVE:  Filed Vitals:   11/20/14 0935  BP: 116/75  Pulse: 86  Temp: 96.6 F (35.9 C)     Body mass index is 32.45 kg/(m^2).    ECOG FS:1 - Symptomatic but completely ambulatory  PHYSICAL EXAM: General  status: Performance status is good.  Patient has not lost significant weight HEENT: No evidence of stomatitis. Sclera and conjunctivae :: No jaundice.   pale looking. Lungs: Air  entry equal on both sides.  No rhonchi.  No rales.  Cardiac: Heart sounds are normal.  No pericardial rub.  No murmur. There is some swelling above port.  Soft.  No fluid.  No redness or tenderness. Lymphatic system: Cervical, axillary, inguinal, lymph nodes not palpable GI: Abdomen is soft.  No ascites.  Liver spleen not palpable.  No tenderness.  Bowel sounds are within normal limit Lower extremity: No edema Neurological system: Higher functions, cranial nerves intact no  evidence of peripheral neuropathy. Skin: No rash.  No ecchymosis.. Examination of both breasts.  Patient had down previous history of carcinoma of left breast no palpable masses axillary lymph nodes are normal.  Right breast free of masses.    LAB RESULTS:  Clinical Support on 11/20/2014  Component Date Value Ref Range Status  . WBC 11/20/2014 7.5  3.6 - 11.0 K/uL Final   A-LINE DRAW  . RBC 11/20/2014 4.65  3.80 - 5.20 MIL/uL Final  . Hemoglobin 11/20/2014 10.6* 12.0 - 16.0 g/dL Final  . HCT 11/20/2014 34.2* 35.0 - 47.0 % Final  . MCV 11/20/2014 73.7* 80.0 - 100.0 fL Final  . MCH 11/20/2014 22.7* 26.0 - 34.0 pg Final  . MCHC 11/20/2014 30.9* 32.0 - 36.0 g/dL Final  . RDW 11/20/2014 17.9* 11.5 - 14.5 % Final  . Platelets 11/20/2014 316  150 - 440 K/uL Final  . Neutrophils Relative % 11/20/2014 75   Final  . Neutro Abs 11/20/2014 5.7  1.4 - 6.5 K/uL Final  . Lymphocytes Relative 11/20/2014 16   Final  . Lymphs Abs 11/20/2014 1.2  1.0 - 3.6 K/uL Final  . Monocytes Relative 11/20/2014 7   Final  . Monocytes Absolute 11/20/2014 0.5  0.2 - 0.9 K/uL Final  . Eosinophils Relative 11/20/2014 1   Final  . Eosinophils Absolute 11/20/2014 0.1  0 - 0.7 K/uL Final  . Basophils Relative 11/20/2014 1   Final  . Basophils Absolute 11/20/2014 0.1  0 - 0.1 K/uL Final  . Sodium 11/20/2014 135  135 - 145 mmol/L Final  . Potassium 11/20/2014 3.6  3.5 - 5.1 mmol/L Final  . Chloride 11/20/2014 98* 101 - 111 mmol/L Final  . CO2 11/20/2014 26  22 - 32 mmol/L Final  . Glucose, Bld 11/20/2014 227* 65 - 99 mg/dL Final  . BUN 11/20/2014 33* 6 - 20 mg/dL Final  . Creatinine, Ser 11/20/2014 1.22* 0.44 - 1.00 mg/dL Final  . Calcium 11/20/2014 8.5* 8.9 - 10.3 mg/dL Final  . Total Protein 11/20/2014 7.0  6.5 - 8.1 g/dL Final  . Albumin 11/20/2014 4.0  3.5 - 5.0 g/dL Final  . AST 11/20/2014 36  15 - 41 U/L Final  . ALT 11/20/2014 20  14 - 54 U/L Final  . Alkaline Phosphatase 11/20/2014 101  38 - 126 U/L Final    . Total Bilirubin 11/20/2014 1.0  0.3 - 1.2 mg/dL Final  . GFR calc non Af Amer 11/20/2014 41* >60 mL/min Final  . GFR calc Af Amer 11/20/2014 48* >60 mL/min Final   Comment: (NOTE) The eGFR has been calculated using the CKD EPI equation. This calculation has not been validated in all clinical situations. eGFR's persistently <60 mL/min signify possible Chronic Kidney Disease.   . Anion gap 11/20/2014 11  5 - 15 Final      STUDIES: Ct Head W Wo Contrast  11/06/2014   CLINICAL DATA:  History of breast cancer 8 years ago with radiation treatment. New diagnosis of small cell lung cancer. Staging.  EXAM: CT HEAD WITHOUT AND WITH CONTRAST  TECHNIQUE: Contiguous axial images were obtained from the base of the skull through the vertex without and with intravenous contrast  CONTRAST:  17m OMNIPAQUE IOHEXOL 300 MG/ML  SOLN  COMPARISON:  CT head without contrast 06/15/2011.  FINDINGS: No evidence for acute infarction, hemorrhage, mass lesion, hydrocephalus, or extra-axial fluid. Moderate cerebral and cerebellar atrophy. Extensive hypoattenuation of the periventricular and subcortical white matter likely representing a combination of post treatment effect and chronic microvascular ischemic change. No large vessel infarct. Scattered lacunar infarcts are seen in the LEFT basal ganglia. Post infusion, no abnormal enhancement of the brain or meninges. Major dural venous sinuses appear patent. Calvarium shows no lytic or sclerotic lesions. No sinus or mastoid air fluid level.  IMPRESSION: No evidence for intracranial metastatic disease from small cell lung cancer.  Moderate atrophy with extensive hypoattenuation of the white matter, as discussed above.   Electronically Signed   By: JStaci RighterM.D.   On: 11/06/2014 12:57   Ct Abdomen Pelvis W Contrast  10/26/2014   CLINICAL DATA:  Follow-up chest CT per patient. Cough and wheezing. Lung nodule identified on outside chest x-ray 09/13/2014. Current history of  diabetes, hypertension. Prior history of left breast cancer post lumpectomy in 2010. Indwelling transvenous pacemaker.  EXAM: CT ABDOMEN AND PELVIS WITH CONTRAST  TECHNIQUE: Multidetector CT imaging of the abdomen and pelvis was performed using the standard protocol following bolus administration of intravenous contrast.  CONTRAST:  828mOMNIPAQUE IOHEXOL 300 MG/ML  SOLN  COMPARISON:  Chest CT 10/09/2014. Abdominal CT 09/29/2013 and 09/04/2013.  FINDINGS: Lower chest: Cardiomegaly, coronary artery calcifications and pacemaker again noted. There is a moderate size hiatal hernia which appears unchanged. No significant pleural effusion. Right lower lobe pulmonary nodules again noted, measuring up to 9 mm on image 6. These are new from abdominal CT done last year. There is increased adjacent patchy right lower lobe airspace disease. The left lung base appears clear.  Hepatobiliary: Portal phase images through the liver were obtained prior to opacification of the hepatic veins. Nevertheless, there are multiple ill-defined hepatic lesions which are highly worrisome for metastases (best seen on series 4). The largest measures at least 2.1 cm in the posterior segment of the right lobe (image 16 of series 4). No significant biliary dilatation status post cholecystectomy.  Pancreas: Unremarkable. No pancreatic ductal dilatation or surrounding inflammatory changes.  Spleen: Normal in size without focal abnormality.  Adrenals/Urinary Tract: Stable small left adrenal nodule consistent with an adenoma based on stability. The right adrenal gland appears normal.There is stable mild renal cortical thinning, cyst formation and a nonobstructing calculus in the right kidney. The left kidney appears normal. No evidence of hydronephrosis. No focal bladder abnormality seen. Pelvic floor laxity likely.  Stomach/Bowel: Moderate size hiatal hernia. No evidence of bowel wall thickening, distension or suspicious lesion. There is a probable  lipoma within the duodenum on image 38.No ascites or peritoneal nodularity.  Vascular/Lymphatic: There are no enlarged abdominal or pelvic lymph nodes. There are stable probable varicosities and phleboliths anterior to the right psoas muscle. Atherosclerosis of the aorta, its branches and the iliac arteries appears unchanged.  Reproductive: Previous hysterectomy.  No evidence of adnexal mass.  Other: No evidence of abdominal wall mass or hernia.  Musculoskeletal: No acute or significant osseous findings. Degenerative changes noted throughout the spine.  IMPRESSION: 1. Right basilar pulmonary nodules as demonstrated on recent chest CT, suspicious for metastatic disease. 2. Multiple hepatic lesions worrisome for metastases. No other evidence of metastatic disease within the abdomen or pelvis. 3. No primary malignancy identified within the abdomen or pelvis. 4. Increased patchy right basilar airspace disease  may reflect atelectasis or sequela of recent aspiration.   Electronically Signed   By: Richardean Sale M.D.   On: 10/26/2014 16:19    ASSESSMENT: 1.  Right upper lobe carcinoma of lung.  Biopsy is positive for small cell undifferentiated tumor CT scan of the abdomen shows multiple liver metastases Stage IV disease Total duration of visit was 45 minutes. 59 or more time was spent in counseling patient and family regarding prognosis and options of treatment and available resources  MEDICAL DECISION MAKING:  MRI scan of brain has been reviewed there is no evidence of metastases.  Intent of chemotherapy is palliation and relief in symptoms and extending survival  All the side effects of chemotherapy including myelosuppression, alopecia, nausea vomiting fatigue weakness.  Secondary infection, and   peripheral neuropathy .  Has been discussed in details. Informal consent has been obtained and will be documented by nurses in the chart Proceed with carboplatinum AUC of 5 and VP-16 80 mg on day 1 and 2 and 3  followed by Neulasta.  Because of patient's elderly condition and previous history of breast cancer patient is at higher risk for neutropenia and infection  hours  Patient expressed understanding and was in agreement with this plan. She also understands that She can call clinic at any time with any questions, concerns, or complaints.    No matching staging information was found for the patient.  Forest Gleason, MD   11/20/2014 7:17 PM

## 2014-11-20 NOTE — Progress Notes (Signed)
Patient does have living will.  Former smoker.

## 2014-11-21 ENCOUNTER — Inpatient Hospital Stay: Payer: Medicare Other

## 2014-11-21 VITALS — BP 122/73 | HR 76 | Temp 98.2°F | Resp 20

## 2014-11-21 DIAGNOSIS — C3411 Malignant neoplasm of upper lobe, right bronchus or lung: Secondary | ICD-10-CM

## 2014-11-21 MED ORDER — SODIUM CHLORIDE 0.9 % IV SOLN
Freq: Once | INTRAVENOUS | Status: AC
  Administered 2014-11-21: 14:00:00 via INTRAVENOUS
  Filled 2014-11-21: qty 1000

## 2014-11-21 MED ORDER — SODIUM CHLORIDE 0.9 % IV SOLN
80.0000 mg/m2 | Freq: Once | INTRAVENOUS | Status: AC
  Administered 2014-11-21: 150 mg via INTRAVENOUS
  Filled 2014-11-21: qty 7.5

## 2014-11-21 MED ORDER — HEPARIN SOD (PORK) LOCK FLUSH 100 UNIT/ML IV SOLN
500.0000 [IU] | Freq: Once | INTRAVENOUS | Status: AC | PRN
  Administered 2014-11-21: 500 [IU]

## 2014-11-22 ENCOUNTER — Ambulatory Visit: Payer: Medicare Other

## 2014-11-22 ENCOUNTER — Inpatient Hospital Stay: Payer: Medicare Other

## 2014-11-22 VITALS — BP 103/65 | HR 70 | Temp 96.4°F | Resp 20

## 2014-11-22 DIAGNOSIS — C3411 Malignant neoplasm of upper lobe, right bronchus or lung: Secondary | ICD-10-CM

## 2014-11-22 MED ORDER — PEGFILGRASTIM 6 MG/0.6ML ~~LOC~~ PSKT
6.0000 mg | PREFILLED_SYRINGE | Freq: Once | SUBCUTANEOUS | Status: AC
  Administered 2014-11-22: 6 mg via SUBCUTANEOUS

## 2014-11-22 MED ORDER — SODIUM CHLORIDE 0.9 % IV SOLN
Freq: Once | INTRAVENOUS | Status: AC
  Administered 2014-11-22: 14:00:00 via INTRAVENOUS
  Filled 2014-11-22: qty 4

## 2014-11-22 MED ORDER — SODIUM CHLORIDE 0.9 % IV SOLN
Freq: Once | INTRAVENOUS | Status: AC
  Administered 2014-11-22: 14:00:00 via INTRAVENOUS
  Filled 2014-11-22: qty 1000

## 2014-11-22 MED ORDER — SODIUM CHLORIDE 0.9 % IV SOLN
80.0000 mg/m2 | Freq: Once | INTRAVENOUS | Status: AC
  Administered 2014-11-22: 150 mg via INTRAVENOUS
  Filled 2014-11-22: qty 7.5

## 2014-11-22 MED ORDER — HEPARIN SOD (PORK) LOCK FLUSH 100 UNIT/ML IV SOLN
INTRAVENOUS | Status: AC
  Filled 2014-11-22: qty 5

## 2014-11-27 ENCOUNTER — Inpatient Hospital Stay: Payer: Medicare Other

## 2014-11-27 DIAGNOSIS — C3411 Malignant neoplasm of upper lobe, right bronchus or lung: Secondary | ICD-10-CM | POA: Diagnosis not present

## 2014-11-27 LAB — CBC WITH DIFFERENTIAL/PLATELET
Basophils Absolute: 0.1 10*3/uL (ref 0–0.1)
Basophils Relative: 1 %
EOS ABS: 0.1 10*3/uL (ref 0–0.7)
EOS PCT: 3 %
HCT: 30.5 % — ABNORMAL LOW (ref 35.0–47.0)
Hemoglobin: 9.4 g/dL — ABNORMAL LOW (ref 12.0–16.0)
LYMPHS PCT: 20 %
Lymphs Abs: 0.9 10*3/uL — ABNORMAL LOW (ref 1.0–3.6)
MCH: 22.6 pg — AB (ref 26.0–34.0)
MCHC: 30.7 g/dL — ABNORMAL LOW (ref 32.0–36.0)
MCV: 73.6 fL — ABNORMAL LOW (ref 80.0–100.0)
MONO ABS: 0.2 10*3/uL (ref 0.2–0.9)
Monocytes Relative: 4 %
Neutro Abs: 3.3 10*3/uL (ref 1.4–6.5)
Neutrophils Relative %: 72 %
PLATELETS: 178 10*3/uL (ref 150–440)
RBC: 4.15 MIL/uL (ref 3.80–5.20)
RDW: 17.7 % — ABNORMAL HIGH (ref 11.5–14.5)
WBC: 4.6 10*3/uL (ref 3.6–11.0)

## 2014-12-04 ENCOUNTER — Inpatient Hospital Stay: Payer: Medicare Other

## 2014-12-04 DIAGNOSIS — C3411 Malignant neoplasm of upper lobe, right bronchus or lung: Secondary | ICD-10-CM | POA: Diagnosis not present

## 2014-12-04 LAB — CBC WITH DIFFERENTIAL/PLATELET
BASOS ABS: 0.1 10*3/uL (ref 0–0.1)
BASOS PCT: 0 %
EOS ABS: 0.1 10*3/uL (ref 0–0.7)
Eosinophils Relative: 0 %
HEMATOCRIT: 29.7 % — AB (ref 35.0–47.0)
Hemoglobin: 9.1 g/dL — ABNORMAL LOW (ref 12.0–16.0)
Lymphocytes Relative: 10 %
Lymphs Abs: 1.6 10*3/uL (ref 1.0–3.6)
MCH: 23 pg — ABNORMAL LOW (ref 26.0–34.0)
MCHC: 30.7 g/dL — ABNORMAL LOW (ref 32.0–36.0)
MCV: 74.8 fL — ABNORMAL LOW (ref 80.0–100.0)
Monocytes Absolute: 0.7 10*3/uL (ref 0.2–0.9)
Monocytes Relative: 4 %
NEUTROS PCT: 86 %
Neutro Abs: 13 10*3/uL — ABNORMAL HIGH (ref 1.4–6.5)
Platelets: 103 10*3/uL — ABNORMAL LOW (ref 150–440)
RBC: 3.97 MIL/uL (ref 3.80–5.20)
RDW: 18 % — ABNORMAL HIGH (ref 11.5–14.5)
WBC: 15.4 10*3/uL — AB (ref 3.6–11.0)

## 2014-12-11 ENCOUNTER — Inpatient Hospital Stay: Payer: Medicare Other | Attending: Oncology

## 2014-12-11 ENCOUNTER — Encounter: Payer: Self-pay | Admitting: Oncology

## 2014-12-11 ENCOUNTER — Inpatient Hospital Stay: Payer: Medicare Other

## 2014-12-11 ENCOUNTER — Inpatient Hospital Stay (HOSPITAL_BASED_OUTPATIENT_CLINIC_OR_DEPARTMENT_OTHER): Payer: Medicare Other | Admitting: Oncology

## 2014-12-11 ENCOUNTER — Telehealth: Payer: Self-pay | Admitting: *Deleted

## 2014-12-11 VITALS — BP 150/68 | HR 89 | Temp 96.7°F | Wt 179.7 lb

## 2014-12-11 VITALS — BP 138/86 | HR 73

## 2014-12-11 DIAGNOSIS — C787 Secondary malignant neoplasm of liver and intrahepatic bile duct: Secondary | ICD-10-CM | POA: Diagnosis not present

## 2014-12-11 DIAGNOSIS — Z7982 Long term (current) use of aspirin: Secondary | ICD-10-CM | POA: Diagnosis not present

## 2014-12-11 DIAGNOSIS — Z8669 Personal history of other diseases of the nervous system and sense organs: Secondary | ICD-10-CM | POA: Insufficient documentation

## 2014-12-11 DIAGNOSIS — G8929 Other chronic pain: Secondary | ICD-10-CM

## 2014-12-11 DIAGNOSIS — C3411 Malignant neoplasm of upper lobe, right bronchus or lung: Secondary | ICD-10-CM

## 2014-12-11 DIAGNOSIS — Z853 Personal history of malignant neoplasm of breast: Secondary | ICD-10-CM | POA: Insufficient documentation

## 2014-12-11 DIAGNOSIS — K219 Gastro-esophageal reflux disease without esophagitis: Secondary | ICD-10-CM | POA: Diagnosis not present

## 2014-12-11 DIAGNOSIS — Z803 Family history of malignant neoplasm of breast: Secondary | ICD-10-CM | POA: Insufficient documentation

## 2014-12-11 DIAGNOSIS — Z950 Presence of cardiac pacemaker: Secondary | ICD-10-CM | POA: Diagnosis not present

## 2014-12-11 DIAGNOSIS — Z5111 Encounter for antineoplastic chemotherapy: Secondary | ICD-10-CM | POA: Diagnosis not present

## 2014-12-11 DIAGNOSIS — Z87891 Personal history of nicotine dependence: Secondary | ICD-10-CM

## 2014-12-11 DIAGNOSIS — Z7901 Long term (current) use of anticoagulants: Secondary | ICD-10-CM | POA: Diagnosis not present

## 2014-12-11 DIAGNOSIS — Z79899 Other long term (current) drug therapy: Secondary | ICD-10-CM

## 2014-12-11 DIAGNOSIS — I10 Essential (primary) hypertension: Secondary | ICD-10-CM

## 2014-12-11 DIAGNOSIS — J449 Chronic obstructive pulmonary disease, unspecified: Secondary | ICD-10-CM | POA: Diagnosis not present

## 2014-12-11 DIAGNOSIS — E119 Type 2 diabetes mellitus without complications: Secondary | ICD-10-CM | POA: Insufficient documentation

## 2014-12-11 DIAGNOSIS — I2510 Atherosclerotic heart disease of native coronary artery without angina pectoris: Secondary | ICD-10-CM | POA: Insufficient documentation

## 2014-12-11 DIAGNOSIS — Z800 Family history of malignant neoplasm of digestive organs: Secondary | ICD-10-CM

## 2014-12-11 DIAGNOSIS — M545 Low back pain: Secondary | ICD-10-CM | POA: Insufficient documentation

## 2014-12-11 DIAGNOSIS — D649 Anemia, unspecified: Secondary | ICD-10-CM

## 2014-12-11 DIAGNOSIS — R05 Cough: Secondary | ICD-10-CM | POA: Insufficient documentation

## 2014-12-11 DIAGNOSIS — M129 Arthropathy, unspecified: Secondary | ICD-10-CM | POA: Insufficient documentation

## 2014-12-11 DIAGNOSIS — R1011 Right upper quadrant pain: Secondary | ICD-10-CM | POA: Insufficient documentation

## 2014-12-11 DIAGNOSIS — I4891 Unspecified atrial fibrillation: Secondary | ICD-10-CM | POA: Insufficient documentation

## 2014-12-11 DIAGNOSIS — M1990 Unspecified osteoarthritis, unspecified site: Secondary | ICD-10-CM

## 2014-12-11 LAB — COMPREHENSIVE METABOLIC PANEL
ALK PHOS: 139 U/L — AB (ref 38–126)
ALT: 15 U/L (ref 14–54)
AST: 26 U/L (ref 15–41)
Albumin: 3.8 g/dL (ref 3.5–5.0)
Anion gap: 7 (ref 5–15)
BILIRUBIN TOTAL: 0.7 mg/dL (ref 0.3–1.2)
BUN: 23 mg/dL — ABNORMAL HIGH (ref 6–20)
CHLORIDE: 102 mmol/L (ref 101–111)
CO2: 26 mmol/L (ref 22–32)
Calcium: 8.3 mg/dL — ABNORMAL LOW (ref 8.9–10.3)
Creatinine, Ser: 1.01 mg/dL — ABNORMAL HIGH (ref 0.44–1.00)
GFR calc Af Amer: 60 mL/min — ABNORMAL LOW (ref >60)
GFR calc non Af Amer: 52 mL/min — ABNORMAL LOW (ref >60)
GLUCOSE: 169 mg/dL — AB (ref 65–99)
POTASSIUM: 3.6 mmol/L (ref 3.5–5.1)
SODIUM: 135 mmol/L (ref 135–145)
Total Protein: 6.9 g/dL (ref 6.5–8.1)

## 2014-12-11 LAB — CBC WITH DIFFERENTIAL/PLATELET
Basophils Absolute: 0.1 10*3/uL (ref 0–0.1)
Basophils Relative: 1 %
Eosinophils Absolute: 0 10*3/uL (ref 0–0.7)
Eosinophils Relative: 0 %
HCT: 30.2 % — ABNORMAL LOW (ref 35.0–47.0)
HEMOGLOBIN: 9.5 g/dL — AB (ref 12.0–16.0)
Lymphocytes Relative: 13 %
Lymphs Abs: 1.6 10*3/uL (ref 1.0–3.6)
MCH: 23.7 pg — AB (ref 26.0–34.0)
MCHC: 31.4 g/dL — ABNORMAL LOW (ref 32.0–36.0)
MCV: 75.4 fL — AB (ref 80.0–100.0)
Monocytes Absolute: 0.7 10*3/uL (ref 0.2–0.9)
Monocytes Relative: 6 %
NEUTROS PCT: 80 %
Neutro Abs: 9.9 10*3/uL — ABNORMAL HIGH (ref 1.4–6.5)
Platelets: 250 10*3/uL (ref 150–440)
RBC: 4 MIL/uL (ref 3.80–5.20)
RDW: 17.9 % — ABNORMAL HIGH (ref 11.5–14.5)
WBC: 12.3 10*3/uL — ABNORMAL HIGH (ref 3.6–11.0)

## 2014-12-11 LAB — MAGNESIUM: Magnesium: 1.9 mg/dL (ref 1.7–2.4)

## 2014-12-11 MED ORDER — SODIUM CHLORIDE 0.9 % IV SOLN
Freq: Once | INTRAVENOUS | Status: AC
  Administered 2014-12-11: 12:00:00 via INTRAVENOUS
  Filled 2014-12-11: qty 1000

## 2014-12-11 MED ORDER — HEPARIN SOD (PORK) LOCK FLUSH 100 UNIT/ML IV SOLN
500.0000 [IU] | Freq: Once | INTRAVENOUS | Status: AC | PRN
  Administered 2014-12-11: 500 [IU]
  Filled 2014-12-11: qty 5

## 2014-12-11 MED ORDER — SODIUM CHLORIDE 0.9 % IV SOLN
80.0000 mg/m2 | Freq: Once | INTRAVENOUS | Status: AC
  Administered 2014-12-11: 150 mg via INTRAVENOUS
  Filled 2014-12-11: qty 7.5

## 2014-12-11 MED ORDER — SODIUM CHLORIDE 0.9 % IV SOLN
Freq: Once | INTRAVENOUS | Status: AC
  Administered 2014-12-11: 12:00:00 via INTRAVENOUS
  Filled 2014-12-11: qty 5

## 2014-12-11 MED ORDER — SODIUM CHLORIDE 0.9 % IV SOLN
410.0000 mg | Freq: Once | INTRAVENOUS | Status: AC
  Administered 2014-12-11: 410 mg via INTRAVENOUS
  Filled 2014-12-11: qty 41

## 2014-12-11 MED ORDER — ETHYL CHLORIDE EX AERO: INHALATION_SPRAY | CUTANEOUS | Status: DC

## 2014-12-11 MED ORDER — PALONOSETRON HCL INJECTION 0.25 MG/5ML
0.2500 mg | Freq: Once | INTRAVENOUS | Status: AC
  Administered 2014-12-11: 0.25 mg via INTRAVENOUS
  Filled 2014-12-11: qty 5

## 2014-12-11 MED ORDER — SODIUM CHLORIDE 0.9 % IJ SOLN
10.0000 mL | INTRAMUSCULAR | Status: DC | PRN
  Administered 2014-12-11: 10 mL
  Filled 2014-12-11: qty 10

## 2014-12-11 NOTE — Progress Notes (Signed)
[place] @ [facility] Telephone:([phone]) [phone]  Fax:([phone]) [phone]     Aberdeen Hafen OB: [birth year]  MR#: [id number]  QJJ#:[id number]  Patient Care Team: Tracie Harrier, MD as PCP - General (Internal Medicine) Isaias Cowman, MD as Consulting Physician (Cardiology)  CHIEF COMPLAINT:  Chief Complaint  Patient presents with  . Follow-up       Carcinoma of breast                                AJCC Staging:  [id number]                               Stage Grouping: I                               Cancer Status: Evidence of disease.                               Estrogen receptor positive and                               progesterone receptor positive. HER- 2                               Negative. 2.  Abnormal CT scan of the chest (June, 2016)  Oncology History   1.  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  Previous history of carcinoma breast stage I disease 3.  Started on chemotherapy with carboplatinum and VP-16 from November 20, 2014 MRI scan of brain was negative (July, 2016)     Cancer of upper lobe of right lung   11/03/2014 Initial Diagnosis Cancer of upper lobe of right lung    Oncology Flowsheet 10/24/2014 11/20/2014 11/21/2014 11/22/2014 12/11/2014  Day, Cycle - Day 1, Cycle 1 Day 2, Cycle 1 Day 3, Cycle 1 Day 1, Cycle 2  CARBOplatin (PARAPLATIN) IV - 360 mg - - 410 mg  dexamethasone (DECADRON) IV - [ 12 mg ] - [ 10 mg ] [ 12 mg ]  etoposide (VEPESID) IV - 80 mg/m2 80 mg/m2 80 mg/m2 80 mg/m2  fosaprepitant (EMEND) IV - [ 150 mg ] - - [ 150 mg ]  ondansetron (ZOFRAN) IV - - - [ 8 mg ] -  palonosetron (ALOXI) IV - 0.25 mg - - 0.25 mg  pegfilgrastim (NEULASTA ONPRO KIT) Crest - - - 6 mg -    INTERVAL HISTORY: 81 year old lady with a previous history of carcinoma of breast also patient is a remote smoker started  having cough initially was thought due to acute bronchitis.  Chest x-ray was abnormal CT scan was obtained.  Which revealed right upper lobe lung mass and right hilar lymph node.  Patient is being referred to me for further evaluation and treatment consideration. No hemoptysis.  No chest pain.  Appetite has been stable. In June, 2016 Patient is here for evaluation patient has undergone EBUS  and bronchoscopy.  Bronchoscopy specimen was negative however biopsy was consistent with small cell undifferentiated carcinoma of lung.  Patient's CT scan of the liver revealed multiple metastases in the liver.  The patient is here to discuss the results and the further planning of treatment Continues to have dry hacking cough.  Patient was started on prednisone without much relief in the cough.  No hemoptysis.  No chest pain.  No difficulty swallowing. July, 2016 Patient is here for ongoing evaluation and initiation of chemotherapy.  Recent had attended chemotherapy class.  He had a port placement.  Patient has developed some swelling around the port area but there is a good blood flow. Family had number of questions regarding treatment December 11, 2014 Patient is here for ongoing evaluation and second cycle of chemotherapy.  No chills.  No fever.  Alopecia.  No evidence of stomatitis.  Cough is improved.  Patient continues to have question about chemotherapy.  He also has lot of pain when port is used.  Appetite has been better.  No tingling.  No numbness. REVIEW OF SYSTEMS:   Gen. status: Patient is extremely apprehensive and not in any acute distress.  Not lost any significant weight. Lungs: No chest pain.  No hemoptysis.  Dry hacking cough no difficulty swallowing HEENT: No headache.  No hearing loss.  No ear pain.  No nosebleed or congestion.  No sore throat.  No difficulty swallowing Cardiovascular system: No chest pain.  No palpitation.  No paroxysmal nocturnal dyspnea.  Patient has a pacemaker has a  history of atrial fibrillation on chronic anticoagulation therapy Gastro intestinal system: No heartburn.  No nausea or vomiting.  No abdominal pain.  No diarrhea.  No constipation.  No rectal bleeding. Skin: No evidence of ecchymosis or rash. Genitourinary system: No dysuria.  No hematuria.  No frequency.  No flank pain. Neurological system: No dizziness.  No tingling.  His was.  no tingling numbness.  no focal weakness or any focal signs. Psychiatric system: Patient is rvery emotional and crying after hearing state news of stage IV carcinoma of lung As per HPI. Otherwise, a complete review of systems is negatve.  PAST MEDICAL HISTORY: Past Medical History  Diagnosis Date  . CHF (congestive heart failure)   . Diabetes mellitus without complication   . Hypertension   . Presence of permanent cardiac pacemaker   . Dysrhythmia     afib  . Depression   . GERD (gastroesophageal reflux disease)   . Coronary artery disease   . Cancer     breast,uterine  . COPD (chronic obstructive pulmonary disease)   . Arthritis   . Anemia   . Anginal pain   . Cancer of upper lobe of right lung 11/03/2014      Surgical History            Diabetes controlled with oral medication                               Previous history of congestive heart                               failure                               Coronary artery disease with stent  placement and bypass surgery Hypertension                               Osteoarthritis                               Colon polyps                               Chronic low back pain                                                              Past Surgical History:                               Coronary bypass surgery and stent                               placement                                Pacemaker because of atrial fibrillation                               Appendectomy                                Hemorrhoidectomy                                                              Family History:                               Family history of breast cancer and colon                               cancer. History of anemia, diabetes, heart                               disease, and hypertension in the family.                                                              Social History:                               Does not smoke now.  Does not drink. Used                               to smoke in the pa ADVANCED DIRECTIVES:  No flowsheet data found.  HEALTH MAINTENANCE: History  Substance Use Topics  . Smoking status: Former Smoker    Quit date: 10/21/1997  . Smokeless tobacco: Never Used  . Alcohol Use: No      Allergies  Allergen Reactions  . Amoxicillin-Pot Clavulanate Hives and Diarrhea  . Fentanyl Itching  . Codeine Nausea And Vomiting and Other (See Comments)    Other Reaction: "very ill"  . Latex Rash  . Other Rash    Current Outpatient Prescriptions  Medication Sig Dispense Refill  . albuterol (PROAIR HFA) 108 (90 BASE) MCG/ACT inhaler Inhale into the lungs.    . ALPRAZolam (XANAX) 0.5 MG tablet Take 0.5 mg by mouth 3 (three) times daily as needed for anxiety.     Marland Kitchen aspirin 81 MG tablet Take 81 mg by mouth daily.     . Biotin 1000 MCG tablet Take 1,000 mcg by mouth daily.    . cyclobenzaprine (FLEXERIL) 5 MG tablet Take 5 mg by mouth daily as needed for muscle spasms.     . dabigatran (PRADAXA) 150 MG CAPS capsule TAKE ONE CAPSULE BY MOUTH TWICE DAILY    . dicyclomine (BENTYL) 10 MG capsule Take 10 mg by mouth 3 (three) times daily as needed (abdominal pain).     . furosemide (LASIX) 40 MG tablet Take 40 mg by mouth daily.     Marland Kitchen gabapentin (NEURONTIN) 100 MG capsule Take 100 mg by mouth every morning.     Marland Kitchen glipiZIDE (GLUCOTROL) 10 MG tablet   0  . glipiZIDE (GLUCOTROL) 5 MG tablet Take 5 mg by mouth 2 (two) times daily.     . isosorbide mononitrate (IMDUR) 60 MG  24 hr tablet Take 60 mg by mouth daily.     Marland Kitchen levothyroxine (SYNTHROID, LEVOTHROID) 25 MCG tablet Take 25 mcg by mouth daily before breakfast.     . lidocaine-prilocaine (EMLA) cream Apply 1 application topically as needed. 30 g 3  . Liraglutide 18 MG/3ML SOPN Inject 3 mLs into the skin daily.     Marland Kitchen losartan (COZAAR) 100 MG tablet Take 100 mg by mouth daily.     . Magnesium-Calcium-Folic Acid 374-827-0 MG TABS Take 1 tablet by mouth daily.     . metFORMIN (GLUCOPHAGE) 1000 MG tablet Take 1,000 mg by mouth 2 (two) times daily.     Marland Kitchen ethyl chloride spray Bring spray with you to chemotherapy appts for chemo staff to use for numbing before port access 120 mL 1  . metoprolol-hydrochlorothiazide (LOPRESSOR HCT) 50-25 MG per tablet Take 1 tablet by mouth daily.     . nitroGLYCERIN (NITROSTAT) 0.4 MG SL tablet Place 0.4 mg under the tongue every 5 (five) minutes as needed for chest pain.     Marland Kitchen ondansetron (ZOFRAN) 8 MG tablet Take 1 tablet (8 mg total) by mouth 2 (two) times daily. Start the day after chemo for 3 days. Then take as needed for nausea or vomiting. 30 tablet 1  . potassium chloride (K-DUR) 10 MEQ tablet Take 10 mEq by mouth 2 (two) times daily.     . RABEprazole (ACIPHEX) 20 MG tablet Take 20 mg by mouth daily.     . simvastatin (ZOCOR) 20 MG tablet Take 20 mg by mouth every  evening.      No current facility-administered medications for this visit.   Facility-Administered Medications Ordered in Other Visits  Medication Dose Route Frequency Provider Last Rate Last Dose  . heparin lock flush 100 unit/mL  500 Units Intravenous Once Forest Gleason, MD   500 Units at 11/20/14 1116  . sodium chloride 0.9 % injection 10 mL  10 mL Intravenous PRN Forest Gleason, MD   10 mL at 11/20/14 0916    OBJECTIVE:  Filed Vitals:   12/11/14 1020  BP: 150/68  Pulse: 89  Temp: 96.7 F (35.9 C)     Body mass index is 32.85 kg/(m^2).    ECOG FS:1 - Symptomatic but completely ambulatory  PHYSICAL  EXAM: General  status: Performance status is good.  Patient has not lost significant weight HEENT: No evidence of stomatitis. Sclera and conjunctivae :: No jaundice.   pale looking. Lungs: Air  entry equal on both sides.  No rhonchi.  No rales.  Cardiac: Heart sounds are normal.  No pericardial rub.  No murmur. There is some swelling above port.  Soft.  No fluid.  No redness or tenderness. Lymphatic system: Cervical, axillary, inguinal, lymph nodes not palpable GI: Abdomen is soft.  No ascites.  Liver spleen not palpable.  No tenderness.  Bowel sounds are within normal limit Lower extremity: No edema Neurological system: Higher functions, cranial nerves intact no evidence of peripheral neuropathy. Skin: No rash.  No ecchymosis.. Examination of both breasts.  Patient had down previous history of carcinoma of left breast no palpable masses axillary lymph nodes are normal.  Right breast free of masses.    LAB RESULTS:  Infusion on 12/11/2014  Component Date Value Ref Range Status  . WBC 12/11/2014 12.3* 3.6 - 11.0 K/uL Final   A-LINE DRAW  . RBC 12/11/2014 4.00  3.80 - 5.20 MIL/uL Final  . Hemoglobin 12/11/2014 9.5* 12.0 - 16.0 g/dL Final  . HCT 12/11/2014 30.2* 35.0 - 47.0 % Final  . MCV 12/11/2014 75.4* 80.0 - 100.0 fL Final  . MCH 12/11/2014 23.7* 26.0 - 34.0 pg Final  . MCHC 12/11/2014 31.4* 32.0 - 36.0 g/dL Final  . RDW 12/11/2014 17.9* 11.5 - 14.5 % Final  . Platelets 12/11/2014 250  150 - 440 K/uL Final  . Neutrophils Relative % 12/11/2014 80   Final  . Neutro Abs 12/11/2014 9.9* 1.4 - 6.5 K/uL Final  . Lymphocytes Relative 12/11/2014 13   Final  . Lymphs Abs 12/11/2014 1.6  1.0 - 3.6 K/uL Final  . Monocytes Relative 12/11/2014 6   Final  . Monocytes Absolute 12/11/2014 0.7  0.2 - 0.9 K/uL Final  . Eosinophils Relative 12/11/2014 0   Final  . Eosinophils Absolute 12/11/2014 0.0  0 - 0.7 K/uL Final  . Basophils Relative 12/11/2014 1   Final  . Basophils Absolute 12/11/2014 0.1   0 - 0.1 K/uL Final  . Sodium 12/11/2014 135  135 - 145 mmol/L Final  . Potassium 12/11/2014 3.6  3.5 - 5.1 mmol/L Final  . Chloride 12/11/2014 102  101 - 111 mmol/L Final  . CO2 12/11/2014 26  22 - 32 mmol/L Final  . Glucose, Bld 12/11/2014 169* 65 - 99 mg/dL Final  . BUN 12/11/2014 23* 6 - 20 mg/dL Final  . Creatinine, Ser 12/11/2014 1.01* 0.44 - 1.00 mg/dL Final  . Calcium 12/11/2014 8.3* 8.9 - 10.3 mg/dL Final  . Total Protein 12/11/2014 6.9  6.5 - 8.1 g/dL Final  . Albumin 12/11/2014 3.8  3.5 - 5.0 g/dL Final  . AST 12/11/2014 26  15 - 41 U/L Final  . ALT 12/11/2014 15  14 - 54 U/L Final  . Alkaline Phosphatase 12/11/2014 139* 38 - 126 U/L Final  . Total Bilirubin 12/11/2014 0.7  0.3 - 1.2 mg/dL Final  . GFR calc non Af Amer 12/11/2014 52* >60 mL/min Final  . GFR calc Af Amer 12/11/2014 60* >60 mL/min Final   Comment: (NOTE) The eGFR has been calculated using the CKD EPI equation. This calculation has not been validated in all clinical situations. eGFR's persistently <60 mL/min signify possible Chronic Kidney Disease.   . Anion gap 12/11/2014 7  5 - 15 Final  . Magnesium 12/11/2014 1.9  1.7 - 2.4 mg/dL Final       ASSESSMENT: 1.  Right upper lobe carcinoma of lung.  Biopsy is positive for small cell undifferentiated tumor CT scan of the abdomen shows multiple liver metastases Stage IV disease All lab data has been reviewed and will proceed with second cycle of chemotherapy   MEDICAL DECISION MAKING:  MRI scan of brain has been reviewed there is no evidence of metastases.  Intent of chemotherapy is palliation and relief in symptoms and extending survival  We will continue total 3 cycles of treatment and reevaluation with CT scan of the chest will be planned.  Depending on the response total 6 cycles of chemotherapy will be given   No matching staging information was found for the patient.  Forest Gleason, MD   12/11/2014 7:53 PM

## 2014-12-11 NOTE — Progress Notes (Signed)
Patient does not have living will.  Former smoker.  Patient states she ran temp of 101 on Sat.  Called on call MD and was instructed to take Tylenol 500 mg.  Resolved.

## 2014-12-11 NOTE — Telephone Encounter (Signed)
Pt has tried emla cream twice and this time she put on cream 1 hour before chemo and then reapplied 30 min. Prior and she was almost in tears at the pain for accessing port because it was not numb.[facility] [place] and they would have to order her a bottle each time and then discard it each time.  It was suggested it would be better if pt got rx from her pharmacy and brought it with her each time she got treated.  i spoke about this with pt while she was in the room with her and she was agreeable with whatever was different than what she has now that does not work. I called her pharnacy and they will order it for her.  i called infusion and gave same message to Katharine Look and she will let pt know that pharmacy will order it

## 2014-12-12 ENCOUNTER — Inpatient Hospital Stay: Payer: Medicare Other

## 2014-12-12 VITALS — BP 144/81 | HR 87 | Temp 96.1°F | Resp 20

## 2014-12-12 DIAGNOSIS — C3411 Malignant neoplasm of upper lobe, right bronchus or lung: Secondary | ICD-10-CM | POA: Diagnosis not present

## 2014-12-12 MED ORDER — SODIUM CHLORIDE 0.9 % IV SOLN
Freq: Once | INTRAVENOUS | Status: AC
  Administered 2014-12-12: 14:00:00 via INTRAVENOUS
  Filled 2014-12-12: qty 1000

## 2014-12-12 MED ORDER — SODIUM CHLORIDE 0.9 % IV SOLN
80.0000 mg/m2 | Freq: Once | INTRAVENOUS | Status: AC
  Administered 2014-12-12: 150 mg via INTRAVENOUS
  Filled 2014-12-12: qty 7.5

## 2014-12-12 MED ORDER — HEPARIN SOD (PORK) LOCK FLUSH 100 UNIT/ML IV SOLN
500.0000 [IU] | Freq: Once | INTRAVENOUS | Status: DC | PRN
  Filled 2014-12-12: qty 5

## 2014-12-12 MED ORDER — SODIUM CHLORIDE 0.9 % IV SOLN
Freq: Once | INTRAVENOUS | Status: AC
  Administered 2014-12-12: 14:00:00 via INTRAVENOUS
  Filled 2014-12-12: qty 4

## 2014-12-13 ENCOUNTER — Inpatient Hospital Stay: Payer: Medicare Other

## 2014-12-13 VITALS — BP 138/82 | HR 84 | Temp 97.0°F | Resp 20

## 2014-12-13 DIAGNOSIS — C3411 Malignant neoplasm of upper lobe, right bronchus or lung: Secondary | ICD-10-CM | POA: Diagnosis not present

## 2014-12-13 MED ORDER — ACETAMINOPHEN 325 MG PO TABS
650.0000 mg | ORAL_TABLET | Freq: Four times a day (QID) | ORAL | Status: DC | PRN
  Administered 2014-12-13: 650 mg via ORAL
  Filled 2014-12-13: qty 2

## 2014-12-13 MED ORDER — SODIUM CHLORIDE 0.9 % IV SOLN
Freq: Once | INTRAVENOUS | Status: AC
  Administered 2014-12-13: 14:00:00 via INTRAVENOUS
  Filled 2014-12-13: qty 4

## 2014-12-13 MED ORDER — PEGFILGRASTIM 6 MG/0.6ML ~~LOC~~ PSKT
6.0000 mg | PREFILLED_SYRINGE | Freq: Once | SUBCUTANEOUS | Status: AC
  Administered 2014-12-13: 6 mg via SUBCUTANEOUS

## 2014-12-13 MED ORDER — HEPARIN SOD (PORK) LOCK FLUSH 100 UNIT/ML IV SOLN
500.0000 [IU] | Freq: Once | INTRAVENOUS | Status: AC
  Administered 2014-12-13: 500 [IU] via INTRAVENOUS

## 2014-12-13 MED ORDER — HEPARIN SOD (PORK) LOCK FLUSH 100 UNIT/ML IV SOLN
INTRAVENOUS | Status: AC
  Filled 2014-12-13: qty 5

## 2014-12-13 MED ORDER — SODIUM CHLORIDE 0.9 % IV SOLN
Freq: Once | INTRAVENOUS | Status: AC
  Administered 2014-12-13: 14:00:00 via INTRAVENOUS
  Filled 2014-12-13: qty 1000

## 2014-12-13 MED ORDER — ETOPOSIDE CHEMO INJECTION 1 GM/50ML
80.0000 mg/m2 | Freq: Once | INTRAVENOUS | Status: AC
  Administered 2014-12-13: 150 mg via INTRAVENOUS
  Filled 2014-12-13: qty 7.5

## 2014-12-13 NOTE — Progress Notes (Signed)
Patient complained of headache 4 out of 10 at 1400 and asked for Tylenol.  Called Dr. Oliva Bustard and asked for an order for Tylenol and he agreed.  Gave patient Tylenol 650 mg po at 1406.  At 1436 patient states pain has decreased to a 1 out of 10.  LJ

## 2014-12-19 ENCOUNTER — Telehealth: Payer: Self-pay | Admitting: *Deleted

## 2014-12-19 ENCOUNTER — Inpatient Hospital Stay: Payer: Medicare Other

## 2014-12-19 DIAGNOSIS — R05 Cough: Secondary | ICD-10-CM

## 2014-12-19 DIAGNOSIS — Z5189 Encounter for other specified aftercare: Secondary | ICD-10-CM

## 2014-12-19 DIAGNOSIS — C3411 Malignant neoplasm of upper lobe, right bronchus or lung: Secondary | ICD-10-CM | POA: Diagnosis not present

## 2014-12-19 DIAGNOSIS — R059 Cough, unspecified: Secondary | ICD-10-CM

## 2014-12-19 LAB — CBC WITH DIFFERENTIAL/PLATELET
BASOS ABS: 0 10*3/uL (ref 0–0.1)
Basophils Relative: 0 %
EOS ABS: 0 10*3/uL (ref 0–0.7)
EOS PCT: 0 %
HCT: 25.7 % — ABNORMAL LOW (ref 35.0–47.0)
Hemoglobin: 8.2 g/dL — ABNORMAL LOW (ref 12.0–16.0)
LYMPHS ABS: 1 10*3/uL (ref 1.0–3.6)
Lymphocytes Relative: 14 %
MCH: 24.1 pg — AB (ref 26.0–34.0)
MCHC: 31.9 g/dL — ABNORMAL LOW (ref 32.0–36.0)
MCV: 75.6 fL — AB (ref 80.0–100.0)
Monocytes Absolute: 0.8 10*3/uL (ref 0.2–0.9)
Monocytes Relative: 12 %
Neutro Abs: 5.3 10*3/uL (ref 1.4–6.5)
Neutrophils Relative %: 74 %
Platelets: 161 10*3/uL (ref 150–440)
RBC: 3.4 MIL/uL — ABNORMAL LOW (ref 3.80–5.20)
RDW: 19.5 % — ABNORMAL HIGH (ref 11.5–14.5)
WBC: 7.1 10*3/uL (ref 3.6–11.0)

## 2014-12-19 MED ORDER — LEVOFLOXACIN 500 MG PO TABS: 500.0000 mg | ORAL_TABLET | Freq: Every day | ORAL | Status: DC

## 2014-12-19 NOTE — Telephone Encounter (Signed)
Per Dr Jeb Levering Levaquin 500 mg daily times 7 days. Patient informed rx sent

## 2014-12-19 NOTE — Addendum Note (Signed)
Addended by: Betti Cruz on: 12/19/2014 11:34 AM   Modules accepted: Orders

## 2014-12-19 NOTE — Telephone Encounter (Addendum)
Per Dr Oliva Bustard, need to come in for CBC check today, pt agree to come in at 1:30 today and was informed to wait for results before she leaves

## 2014-12-19 NOTE — Telephone Encounter (Signed)
Called to report she has low grade fever to 100 for a few days congestion and nonproductive cough. Also has sore throat

## 2014-12-27 ENCOUNTER — Encounter: Payer: Self-pay | Admitting: Family Medicine

## 2014-12-27 ENCOUNTER — Inpatient Hospital Stay (HOSPITAL_BASED_OUTPATIENT_CLINIC_OR_DEPARTMENT_OTHER): Payer: Medicare Other | Admitting: Family Medicine

## 2014-12-27 ENCOUNTER — Telehealth: Payer: Self-pay | Admitting: *Deleted

## 2014-12-27 VITALS — BP 131/77 | HR 90 | Temp 98.2°F | Wt 177.7 lb

## 2014-12-27 DIAGNOSIS — M129 Arthropathy, unspecified: Secondary | ICD-10-CM

## 2014-12-27 DIAGNOSIS — I4891 Unspecified atrial fibrillation: Secondary | ICD-10-CM

## 2014-12-27 DIAGNOSIS — Z79899 Other long term (current) drug therapy: Secondary | ICD-10-CM

## 2014-12-27 DIAGNOSIS — C787 Secondary malignant neoplasm of liver and intrahepatic bile duct: Secondary | ICD-10-CM

## 2014-12-27 DIAGNOSIS — J449 Chronic obstructive pulmonary disease, unspecified: Secondary | ICD-10-CM

## 2014-12-27 DIAGNOSIS — I2510 Atherosclerotic heart disease of native coronary artery without angina pectoris: Secondary | ICD-10-CM

## 2014-12-27 DIAGNOSIS — I10 Essential (primary) hypertension: Secondary | ICD-10-CM

## 2014-12-27 DIAGNOSIS — Z800 Family history of malignant neoplasm of digestive organs: Secondary | ICD-10-CM

## 2014-12-27 DIAGNOSIS — Z7901 Long term (current) use of anticoagulants: Secondary | ICD-10-CM

## 2014-12-27 DIAGNOSIS — C3411 Malignant neoplasm of upper lobe, right bronchus or lung: Secondary | ICD-10-CM | POA: Diagnosis not present

## 2014-12-27 DIAGNOSIS — R1011 Right upper quadrant pain: Secondary | ICD-10-CM | POA: Diagnosis not present

## 2014-12-27 DIAGNOSIS — Z87891 Personal history of nicotine dependence: Secondary | ICD-10-CM

## 2014-12-27 DIAGNOSIS — Z8669 Personal history of other diseases of the nervous system and sense organs: Secondary | ICD-10-CM

## 2014-12-27 DIAGNOSIS — M1990 Unspecified osteoarthritis, unspecified site: Secondary | ICD-10-CM

## 2014-12-27 DIAGNOSIS — D649 Anemia, unspecified: Secondary | ICD-10-CM

## 2014-12-27 DIAGNOSIS — E119 Type 2 diabetes mellitus without complications: Secondary | ICD-10-CM

## 2014-12-27 DIAGNOSIS — Z853 Personal history of malignant neoplasm of breast: Secondary | ICD-10-CM

## 2014-12-27 DIAGNOSIS — Z950 Presence of cardiac pacemaker: Secondary | ICD-10-CM

## 2014-12-27 DIAGNOSIS — K219 Gastro-esophageal reflux disease without esophagitis: Secondary | ICD-10-CM

## 2014-12-27 DIAGNOSIS — M545 Low back pain: Secondary | ICD-10-CM

## 2014-12-27 DIAGNOSIS — Z803 Family history of malignant neoplasm of breast: Secondary | ICD-10-CM

## 2014-12-27 DIAGNOSIS — Z7982 Long term (current) use of aspirin: Secondary | ICD-10-CM

## 2014-12-27 DIAGNOSIS — G8929 Other chronic pain: Secondary | ICD-10-CM

## 2014-12-27 DIAGNOSIS — R05 Cough: Secondary | ICD-10-CM

## 2014-12-27 MED ORDER — OXYCODONE HCL 5 MG PO TABS: 5.0000 mg | ORAL_TABLET | ORAL | Status: DC | PRN

## 2014-12-27 NOTE — Telephone Encounter (Signed)
Has new onset of right side across waist into back pain and wants to know why. Denies any further fevers. Reports sensitive to touch. Denies urinary symptoms. Pain is a deep constant ache starting last night. Reports a little swelling on that side also. B/P 120/89 this morning and now 79/48. Denies dizziness. I had her to recheck B/P 168/114

## 2014-12-27 NOTE — Telephone Encounter (Signed)
Pt will come in to be seen this afternoon

## 2014-12-27 NOTE — Progress Notes (Signed)
[employer]  [phone]  [phone]     Wendy Giles DOB: 07-30-1932  MR#: [id number]  TZG#:[id number]  Patient Care Team: Tracie Harrier, MD as PCP - General (Internal Medicine) Isaias Cowman, MD as Consulting Physician (Cardiology)  CHIEF COMPLAINT:  Chief Complaint  Patient presents with  . Pain    INTERVAL HISTORY: Patient is here as an acute abdomen for abdominal pain on the right side over the liver area that began last night. She reports the pain as being a dull deep achy feeling that started suddenly and has persisted throughout the day. Patient has a history of stage IV lung cancer with metastasis to the liver. Patient was started on carboplatin and VP-16 in July 2016. She has not taken anything other than Tylenol for pain.  REVIEW OF SYSTEMS:   Review of Systems  Constitutional: Positive for malaise/fatigue. Negative for fever, chills, weight loss and diaphoresis.  HENT: Negative for congestion, ear discharge, ear pain, hearing loss, nosebleeds, sore throat and tinnitus.   Eyes: Negative for blurred vision, double vision, photophobia, pain, discharge and redness.  Respiratory: Negative for cough, hemoptysis, sputum production, shortness of breath, wheezing and stridor.   Cardiovascular: Negative for chest pain, palpitations, orthopnea, claudication, leg swelling and PND.  Gastrointestinal: Positive for abdominal pain. Negative for heartburn, nausea, vomiting, diarrhea, constipation, blood in stool and melena.  Genitourinary: Negative.   Musculoskeletal: Negative.   Skin: Negative.   Neurological: Positive for weakness. Negative for dizziness, tingling, focal weakness, seizures and headaches.  Endo/Heme/Allergies: Does not bruise/bleed easily.  Psychiatric/Behavioral: Negative for depression. The patient is nervous/anxious. The patient does not have insomnia.     As per HPI. Otherwise, a complete review of systems is  negatve.  ONCOLOGY HISTORY: Oncology History   1.  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  Previous history of carcinoma breast stage I disease 3.  Started on chemotherapy with carboplatinum and VP-16 from November 20, 2014 MRI scan of brain was negative (July, 2016)     Cancer of upper lobe of right lung   11/03/2014 Initial Diagnosis Cancer of upper lobe of right lung    PAST MEDICAL HISTORY: Past Medical History  Diagnosis Date  . CHF (congestive heart failure)   . Diabetes mellitus without complication   . Hypertension   . Presence of permanent cardiac pacemaker   . Dysrhythmia     afib  . Depression   . GERD (gastroesophageal reflux disease)   . Coronary artery disease   . Cancer     breast,uterine  . COPD (chronic obstructive pulmonary disease)   . Arthritis   . Anemia   . Anginal pain   . Cancer of upper lobe of right lung 11/03/2014    PAST SURGICAL HISTORY: Past Surgical History  Procedure Laterality Date  . Cholecystectomy    . Coronary angioplasty    . Coronary artery bypass graft    . Appendectomy    . Mastectomy    . Ablation of dysrhythmic focus    . Cardioversion    . Joint replacement      tkr  . Insert / replace / remove pacemaker    . Peripheral vascular catheterization N/A 11/05/2014    Procedure: Glori Luis Cath Insertion;  Surgeon: Algernon Huxley, MD;  Location: [facility];  Service: Cardiovascular;  Laterality: N/A;  FAMILY HISTORY History reviewed. No pertinent family history.  GYNECOLOGIC HISTORY:  No LMP recorded. Patient is postmenopausal.     ADVANCED DIRECTIVES:    HEALTH MAINTENANCE: Social History  Substance Use Topics  . Smoking status: Former Smoker    Quit date: 10/21/1997  . Smokeless tobacco: Never Used  . Alcohol Use: No     Colonoscopy:  PAP:  Bone  density:  Lipid panel:  Allergies  Allergen Reactions  . Amoxicillin-Pot Clavulanate Hives and Diarrhea  . Fentanyl Itching  . Codeine Nausea And Vomiting and Other (See Comments)    Other Reaction: "very ill"  . Latex Rash  . Other Rash    Current Outpatient Prescriptions  Medication Sig Dispense Refill  . insulin lispro (HUMALOG) 100 UNIT/ML KiwkPen as per sliding scale< than 150-- No insulin150-200---4 units 201-250---6 units251-300--- 8 units301-350----10 units.351-400---12 units    . albuterol (PROAIR HFA) 108 (90 BASE) MCG/ACT inhaler Inhale into the lungs.    . ALPRAZolam (XANAX) 0.5 MG tablet Take 0.5 mg by mouth 3 (three) times daily as needed for anxiety.     Marland Kitchen aspirin 81 MG tablet Take 81 mg by mouth daily.     . Biotin 1000 MCG tablet Take 1,000 mcg by mouth daily.    . cyclobenzaprine (FLEXERIL) 5 MG tablet Take 5 mg by mouth daily as needed for muscle spasms.     . dabigatran (PRADAXA) 150 MG CAPS capsule TAKE ONE CAPSULE BY MOUTH TWICE DAILY    . dicyclomine (BENTYL) 10 MG capsule Take 10 mg by mouth 3 (three) times daily as needed (abdominal pain).     Marland Kitchen ethyl chloride spray Bring spray with you to chemotherapy appts for chemo staff to use for numbing before port access 120 mL 1  . furosemide (LASIX) 40 MG tablet Take 40 mg by mouth daily.     Marland Kitchen gabapentin (NEURONTIN) 100 MG capsule Take 100 mg by mouth every morning.     Marland Kitchen glipiZIDE (GLUCOTROL) 10 MG tablet   0  . glipiZIDE (GLUCOTROL) 5 MG tablet Take 5 mg by mouth 2 (two) times daily.     . isosorbide mononitrate (IMDUR) 60 MG 24 hr tablet Take 60 mg by mouth daily.     Marland Kitchen levofloxacin (LEVAQUIN) 500 MG tablet Take 1 tablet (500 mg total) by mouth daily. 7 tablet 0  . levothyroxine (SYNTHROID, LEVOTHROID) 25 MCG tablet Take 25 mcg by mouth daily before breakfast.     . lidocaine-prilocaine (EMLA) cream Apply 1 application topically as needed. 30 g 3  . Liraglutide 18 MG/3ML SOPN Inject 3 mLs into the skin daily.       Marland Kitchen losartan (COZAAR) 100 MG tablet Take 100 mg by mouth daily.     . Magnesium-Calcium-Folic Acid 177-939-0 MG TABS Take 1 tablet by mouth daily.     . metFORMIN (GLUCOPHAGE) 1000 MG tablet Take 1,000 mg by mouth 2 (two) times daily.     . metoprolol-hydrochlorothiazide (LOPRESSOR HCT) 50-25 MG per tablet Take 1 tablet by mouth daily.     . nitroGLYCERIN (NITROSTAT) 0.4 MG SL tablet Place 0.4 mg under the tongue every 5 (five) minutes as needed for chest pain.     Marland Kitchen ondansetron (ZOFRAN) 8 MG tablet Take 1 tablet (8 mg total) by mouth 2 (two) times daily. Start the day after chemo for 3 days. Then take as needed for nausea or vomiting. 30 tablet 1  . oxyCODONE (OXY IR/ROXICODONE) 5 MG immediate release tablet Take 1 tablet (  5 mg total) by mouth every 4 (four) hours as needed for severe pain. 30 tablet 0  . potassium chloride (K-DUR) 10 MEQ tablet Take 10 mEq by mouth 2 (two) times daily.     . RABEprazole (ACIPHEX) 20 MG tablet Take 20 mg by mouth daily.     . simvastatin (ZOCOR) 20 MG tablet Take 20 mg by mouth every evening.      No current facility-administered medications for this visit.   Facility-Administered Medications Ordered in Other Visits  Medication Dose Route Frequency Provider Last Rate Last Dose  . heparin lock flush 100 unit/mL  500 Units Intravenous Once Forest Gleason, MD   500 Units at 11/20/14 1116  . sodium chloride 0.9 % injection 10 mL  10 mL Intravenous PRN Forest Gleason, MD   10 mL at 11/20/14 0916    OBJECTIVE: BP 131/77 mmHg  Pulse 90  Temp(Src) 98.2 F (36.8 C) (Tympanic)  Wt 177 lb 11.1 oz (80.6 kg)   Body mass index is 32.49 kg/(m^2).    ECOG FS:1 - Symptomatic but completely ambulatory  General: Well-developed, well-nourished, no acute distress. Eyes: Pink conjunctiva, anicteric sclera. HEENT: Normocephalic, moist mucous membranes, clear oropharnyx. Lungs: Clear to auscultation bilaterally. Heart: Regular rate and rhythm. No rubs, murmurs, or  gallops. Abdomen: Abdomen tender to palpation. Right upper quadrant with a large bulging area of edema extending to the flank likely from inflammation of liver. Musculoskeletal: No edema, cyanosis, or clubbing. Neuro: Alert, answering all questions appropriately. Cranial nerves grossly intact. Skin: No rashes or petechiae noted. Psych: Normal affect.    LAB RESULTS:  No visits with results within 3 Day(s) from this visit. Latest known visit with results is:  Appointment on 12/19/2014  Component Date Value Ref Range Status  . WBC 12/19/2014 7.1  3.6 - 11.0 K/uL Final  . RBC 12/19/2014 3.40* 3.80 - 5.20 MIL/uL Final  . Hemoglobin 12/19/2014 8.2* 12.0 - 16.0 g/dL Final  . HCT 12/19/2014 25.7* 35.0 - 47.0 % Final  . MCV 12/19/2014 75.6* 80.0 - 100.0 fL Final  . MCH 12/19/2014 24.1* 26.0 - 34.0 pg Final  . MCHC 12/19/2014 31.9* 32.0 - 36.0 g/dL Final  . RDW 12/19/2014 19.5* 11.5 - 14.5 % Final  . Platelets 12/19/2014 161  150 - 440 K/uL Final  . Neutrophils Relative % 12/19/2014 74   Final  . Neutro Abs 12/19/2014 5.3  1.4 - 6.5 K/uL Final  . Lymphocytes Relative 12/19/2014 14   Final  . Lymphs Abs 12/19/2014 1.0  1.0 - 3.6 K/uL Final  . Monocytes Relative 12/19/2014 12   Final  . Monocytes Absolute 12/19/2014 0.8  0.2 - 0.9 K/uL Final  . Eosinophils Relative 12/19/2014 0   Final  . Eosinophils Absolute 12/19/2014 0.0  0 - 0.7 K/uL Final  . Basophils Relative 12/19/2014 0   Final  . Basophils Absolute 12/19/2014 0.0  0 - 0.1 K/uL Final    STUDIES: No results found.  ASSESSMENT:  Patient with stage IV small cell lung cancer of the right upper lobe with liver metastasis   PLAN:   1. Stage IV small cell lung cancer with liver metastases. Pain in right upper quadrant likely related to inflammation of the liver. possibly due to chemotherapy. Patient has been on carboplatin and VP-16 therapy for 3 cycles now. She is due for cycle #4 next Monday. After that cycle we will order CT  scan for further evaluation of disease. Oxycodone prescription given to patient. Patient reports having  previous nausea with codeine, advised patient to take Zofran 30 minutes before pain medication if possible and at the same time if acute onset of pain.  Patient expressed understanding and was in agreement with this plan. She also understands that She can call clinic at any time with any questions, concerns, or complaints.   Dr. Oliva Bustard was available for consultation and review of plan of care for this patient.  Cancer of upper lobe of right lung   Staging form: Lung, AJCC 7th Edition     Clinical: T2, N2, M1 - Signed by Forest Gleason, MD on 11/03/2014   Evlyn Kanner, NP   12/27/2014 3:25 PM

## 2014-12-31 ENCOUNTER — Inpatient Hospital Stay (HOSPITAL_BASED_OUTPATIENT_CLINIC_OR_DEPARTMENT_OTHER): Payer: Medicare Other | Admitting: Family Medicine

## 2014-12-31 ENCOUNTER — Inpatient Hospital Stay: Payer: Medicare Other

## 2014-12-31 ENCOUNTER — Encounter: Payer: Self-pay | Admitting: Oncology

## 2014-12-31 VITALS — BP 145/75 | HR 79 | Temp 96.0°F | Resp 20

## 2014-12-31 VITALS — BP 126/64 | HR 88 | Temp 95.2°F | Wt 183.2 lb

## 2014-12-31 DIAGNOSIS — R1011 Right upper quadrant pain: Secondary | ICD-10-CM

## 2014-12-31 DIAGNOSIS — C3411 Malignant neoplasm of upper lobe, right bronchus or lung: Secondary | ICD-10-CM

## 2014-12-31 DIAGNOSIS — I10 Essential (primary) hypertension: Secondary | ICD-10-CM

## 2014-12-31 DIAGNOSIS — I4891 Unspecified atrial fibrillation: Secondary | ICD-10-CM

## 2014-12-31 DIAGNOSIS — R05 Cough: Secondary | ICD-10-CM

## 2014-12-31 DIAGNOSIS — Z87891 Personal history of nicotine dependence: Secondary | ICD-10-CM

## 2014-12-31 DIAGNOSIS — J449 Chronic obstructive pulmonary disease, unspecified: Secondary | ICD-10-CM

## 2014-12-31 DIAGNOSIS — M1990 Unspecified osteoarthritis, unspecified site: Secondary | ICD-10-CM

## 2014-12-31 DIAGNOSIS — D649 Anemia, unspecified: Secondary | ICD-10-CM

## 2014-12-31 DIAGNOSIS — C787 Secondary malignant neoplasm of liver and intrahepatic bile duct: Secondary | ICD-10-CM | POA: Diagnosis not present

## 2014-12-31 DIAGNOSIS — Z79899 Other long term (current) drug therapy: Secondary | ICD-10-CM | POA: Diagnosis not present

## 2014-12-31 DIAGNOSIS — I2510 Atherosclerotic heart disease of native coronary artery without angina pectoris: Secondary | ICD-10-CM

## 2014-12-31 DIAGNOSIS — K219 Gastro-esophageal reflux disease without esophagitis: Secondary | ICD-10-CM

## 2014-12-31 DIAGNOSIS — Z803 Family history of malignant neoplasm of breast: Secondary | ICD-10-CM

## 2014-12-31 DIAGNOSIS — Z853 Personal history of malignant neoplasm of breast: Secondary | ICD-10-CM

## 2014-12-31 DIAGNOSIS — Z7901 Long term (current) use of anticoagulants: Secondary | ICD-10-CM

## 2014-12-31 DIAGNOSIS — Z950 Presence of cardiac pacemaker: Secondary | ICD-10-CM

## 2014-12-31 DIAGNOSIS — E119 Type 2 diabetes mellitus without complications: Secondary | ICD-10-CM

## 2014-12-31 DIAGNOSIS — Z800 Family history of malignant neoplasm of digestive organs: Secondary | ICD-10-CM

## 2014-12-31 DIAGNOSIS — Z7982 Long term (current) use of aspirin: Secondary | ICD-10-CM

## 2014-12-31 DIAGNOSIS — G8929 Other chronic pain: Secondary | ICD-10-CM

## 2014-12-31 DIAGNOSIS — M129 Arthropathy, unspecified: Secondary | ICD-10-CM

## 2014-12-31 DIAGNOSIS — M545 Low back pain: Secondary | ICD-10-CM

## 2014-12-31 DIAGNOSIS — Z8669 Personal history of other diseases of the nervous system and sense organs: Secondary | ICD-10-CM

## 2014-12-31 LAB — CBC WITH DIFFERENTIAL/PLATELET
Basophils Absolute: 0.1 10*3/uL (ref 0–0.1)
Basophils Relative: 0 %
Eosinophils Absolute: 0.1 10*3/uL (ref 0–0.7)
Eosinophils Relative: 0 %
HEMATOCRIT: 26.9 % — AB (ref 35.0–47.0)
HEMOGLOBIN: 8.6 g/dL — AB (ref 12.0–16.0)
LYMPHS ABS: 1.8 10*3/uL (ref 1.0–3.6)
Lymphocytes Relative: 11 %
MCH: 25.1 pg — AB (ref 26.0–34.0)
MCHC: 31.9 g/dL — ABNORMAL LOW (ref 32.0–36.0)
MCV: 78.7 fL — AB (ref 80.0–100.0)
Monocytes Absolute: 0.9 10*3/uL (ref 0.2–0.9)
Monocytes Relative: 5 %
NEUTROS PCT: 84 %
Neutro Abs: 13.3 10*3/uL — ABNORMAL HIGH (ref 1.4–6.5)
Platelets: 284 10*3/uL (ref 150–440)
RBC: 3.42 MIL/uL — AB (ref 3.80–5.20)
RDW: 27.6 % — ABNORMAL HIGH (ref 11.5–14.5)
WBC: 16.1 10*3/uL — AB (ref 3.6–11.0)

## 2014-12-31 LAB — COMPREHENSIVE METABOLIC PANEL
ALK PHOS: 147 U/L — AB (ref 38–126)
ALT: 16 U/L (ref 14–54)
AST: 27 U/L (ref 15–41)
Albumin: 3.8 g/dL (ref 3.5–5.0)
Anion gap: 7 (ref 5–15)
BUN: 28 mg/dL — ABNORMAL HIGH (ref 6–20)
CALCIUM: 8 mg/dL — AB (ref 8.9–10.3)
CO2: 28 mmol/L (ref 22–32)
CREATININE: 1.27 mg/dL — AB (ref 0.44–1.00)
Chloride: 98 mmol/L — ABNORMAL LOW (ref 101–111)
GFR calc non Af Amer: 39 mL/min — ABNORMAL LOW (ref >60)
GFR, EST AFRICAN AMERICAN: 45 mL/min — AB (ref >60)
Glucose, Bld: 201 mg/dL — ABNORMAL HIGH (ref 65–99)
Potassium: 3.8 mmol/L (ref 3.5–5.1)
Sodium: 133 mmol/L — ABNORMAL LOW (ref 135–145)
Total Bilirubin: 0.6 mg/dL (ref 0.3–1.2)
Total Protein: 6.4 g/dL — ABNORMAL LOW (ref 6.5–8.1)

## 2014-12-31 MED ORDER — HEPARIN SOD (PORK) LOCK FLUSH 100 UNIT/ML IV SOLN
500.0000 [IU] | Freq: Once | INTRAVENOUS | Status: AC | PRN
  Administered 2014-12-31: 500 [IU]
  Filled 2014-12-31 (×2): qty 5

## 2014-12-31 MED ORDER — SODIUM CHLORIDE 0.9 % IJ SOLN
10.0000 mL | Freq: Once | INTRAMUSCULAR | Status: AC
  Administered 2014-12-31: 10 mL via INTRAVENOUS
  Filled 2014-12-31: qty 10

## 2014-12-31 MED ORDER — SODIUM CHLORIDE 0.9 % IV SOLN
Freq: Once | INTRAVENOUS | Status: AC
  Administered 2014-12-31: 11:00:00 via INTRAVENOUS
  Filled 2014-12-31: qty 5

## 2014-12-31 MED ORDER — SODIUM CHLORIDE 0.9 % IV SOLN
Freq: Once | INTRAVENOUS | Status: AC
  Administered 2014-12-31: 11:00:00 via INTRAVENOUS
  Filled 2014-12-31: qty 1000

## 2014-12-31 MED ORDER — PALONOSETRON HCL INJECTION 0.25 MG/5ML
0.2500 mg | Freq: Once | INTRAVENOUS | Status: AC
  Administered 2014-12-31: 0.25 mg via INTRAVENOUS
  Filled 2014-12-31: qty 5

## 2014-12-31 MED ORDER — SODIUM CHLORIDE 0.9 % IV SOLN: Freq: Once | INTRAVENOUS | Status: DC

## 2014-12-31 MED ORDER — SODIUM CHLORIDE 0.9 % IV SOLN
360.0000 mg | Freq: Once | INTRAVENOUS | Status: AC
  Administered 2014-12-31: 360 mg via INTRAVENOUS
  Filled 2014-12-31: qty 36

## 2014-12-31 MED ORDER — SODIUM CHLORIDE 0.9 % IV SOLN
80.0000 mg/m2 | Freq: Once | INTRAVENOUS | Status: AC
  Administered 2014-12-31: 150 mg via INTRAVENOUS
  Filled 2014-12-31: qty 7.5

## 2014-12-31 NOTE — Progress Notes (Signed)
[place]  [phone]) [phone]  Fax:(336) [phone]     Wendy Giles DOB: 08-Mar-1933  MR#: [id number]  PPJ#:[id number]  Patient Care Team: Tracie Harrier, MD as PCP - General (Internal Medicine) Isaias Cowman, MD as Consulting Physician (Cardiology)  CHIEF COMPLAINT:  Chief Complaint  Patient presents with  . Follow-up    INTERVAL HISTORY:  Patient is in for continued evaluation and treatment consideration regarding small cell undifferentiated carcinoma of the lung with metastasis to liver. Patient also with a significant history of breast cancer, stage I disease. She overall reports feeling fairly well. She was seen in our clinic last week as an acute add-on with increasing right upper quadrant pain. At that time patient had increasing tenderness as well as distention of the right upper quadrant with palpable liver present. She reports increasing her pain medication has helped. She is feeling well today and is here for continued chemotherapy.  REVIEW OF SYSTEMS:   Review of Systems  Constitutional: Positive for malaise/fatigue. Negative for fever, chills, weight loss and diaphoresis.  HENT: Negative for congestion, ear discharge, ear pain, hearing loss, nosebleeds, sore throat and tinnitus.   Eyes: Negative for blurred vision, double vision, photophobia, pain, discharge and redness.  Respiratory: Negative for cough, hemoptysis, sputum production, shortness of breath, wheezing and stridor.   Cardiovascular: Negative for chest pain, palpitations, orthopnea, claudication, leg swelling and PND.  Gastrointestinal: Positive for abdominal pain. Negative for heartburn, nausea, vomiting, diarrhea, constipation, blood in stool and melena.  Genitourinary: Negative.   Musculoskeletal: Negative.   Skin: Negative.   Neurological: Negative for dizziness, tingling, focal weakness, seizures, weakness and headaches.  Endo/Heme/Allergies: Does not bruise/bleed easily.    Psychiatric/Behavioral: Negative for depression. The patient is not nervous/anxious and does not have insomnia.     As per HPI. Otherwise, a complete review of systems is negatve.  ONCOLOGY HISTORY: Oncology History   1.  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  Previous history of carcinoma breast stage I disease 3.  Started on chemotherapy with carboplatinum and VP-16 from November 20, 2014 MRI scan of brain was negative (July, 2016)     Cancer of upper lobe of right lung   11/03/2014 Initial Diagnosis Cancer of upper lobe of right lung    PAST MEDICAL HISTORY: Past Medical History  Diagnosis Date  . CHF (congestive heart failure)   . Diabetes mellitus without complication   . Hypertension   . Presence of permanent cardiac pacemaker   . Dysrhythmia     afib  . Depression   . GERD (gastroesophageal reflux disease)   . Coronary artery disease   . Cancer     breast,uterine  . COPD (chronic obstructive pulmonary disease)   . Arthritis   . Anemia   . Anginal pain   . Cancer of upper lobe of right lung 11/03/2014    PAST SURGICAL HISTORY: Past Surgical History  Procedure Laterality Date  . Cholecystectomy    . Coronary angioplasty    . Coronary artery bypass graft    . Appendectomy    . Mastectomy    . Ablation of dysrhythmic focus    . Cardioversion    . Joint replacement      tkr  . Insert / replace / remove pacemaker    . Peripheral vascular catheterization N/A 11/05/2014    Procedure: Glori Luis  Cath Insertion;  Surgeon: Algernon Huxley, MD;  Location: [facility];  Service: Cardiovascular;  Laterality: N/A;    FAMILY HISTORY No family history on file.  GYNECOLOGIC HISTORY:  No LMP recorded. Patient is postmenopausal.     ADVANCED DIRECTIVES:    HEALTH MAINTENANCE: Social History  Substance Use Topics   . Smoking status: Former Smoker    Quit date: 10/21/1997  . Smokeless tobacco: Never Used  . Alcohol Use: No     Colonoscopy:  PAP:  Bone density:  Lipid panel:  Allergies  Allergen Reactions  . Amoxicillin-Pot Clavulanate Hives and Diarrhea  . Fentanyl Itching  . Codeine Nausea And Vomiting and Other (See Comments)    Other Reaction: "very ill"  . Latex Rash  . Other Rash    Current Outpatient Prescriptions  Medication Sig Dispense Refill  . albuterol (PROAIR HFA) 108 (90 BASE) MCG/ACT inhaler Inhale into the lungs.    . ALPRAZolam (XANAX) 0.5 MG tablet Take 0.5 mg by mouth 3 (three) times daily as needed for anxiety.     Marland Kitchen aspirin 81 MG tablet Take 81 mg by mouth daily.     . Biotin 1000 MCG tablet Take 1,000 mcg by mouth daily.    . cyclobenzaprine (FLEXERIL) 5 MG tablet Take 5 mg by mouth daily as needed for muscle spasms.     . dabigatran (PRADAXA) 150 MG CAPS capsule TAKE ONE CAPSULE BY MOUTH TWICE DAILY    . dicyclomine (BENTYL) 10 MG capsule Take 10 mg by mouth 3 (three) times daily as needed (abdominal pain).     Marland Kitchen ethyl chloride spray Bring spray with you to chemotherapy appts for chemo staff to use for numbing before port access 120 mL 1  . furosemide (LASIX) 40 MG tablet Take 40 mg by mouth daily.     Marland Kitchen gabapentin (NEURONTIN) 100 MG capsule Take 100 mg by mouth every morning.     Marland Kitchen glipiZIDE (GLUCOTROL) 10 MG tablet   0  . glipiZIDE (GLUCOTROL) 5 MG tablet Take 5 mg by mouth 2 (two) times daily.     . insulin lispro (HUMALOG) 100 UNIT/ML KiwkPen as per sliding scale< than 150-- No insulin150-200---4 units 201-250---6 units251-300--- 8 units301-350----10 units.351-400---12 units    . isosorbide mononitrate (IMDUR) 60 MG 24 hr tablet Take 60 mg by mouth daily.     Marland Kitchen levothyroxine (SYNTHROID, LEVOTHROID) 25 MCG tablet Take 25 mcg by mouth daily before breakfast.     . lidocaine-prilocaine (EMLA) cream Apply 1 application topically as needed. 30 g 3  . Liraglutide 18  MG/3ML SOPN Inject 3 mLs into the skin daily.     Marland Kitchen losartan (COZAAR) 100 MG tablet Take 100 mg by mouth daily.     . Magnesium-Calcium-Folic Acid 426-834-1 MG TABS Take 1 tablet by mouth daily.     . metFORMIN (GLUCOPHAGE) 1000 MG tablet Take 1,000 mg by mouth 2 (two) times daily.     . metoprolol-hydrochlorothiazide (LOPRESSOR HCT) 50-25 MG per tablet Take 1 tablet by mouth daily.     . nitroGLYCERIN (NITROSTAT) 0.4 MG SL tablet Place 0.4 mg under the tongue every 5 (five) minutes as needed for chest pain.     Marland Kitchen ondansetron (ZOFRAN) 8 MG tablet Take 1 tablet (8 mg total) by mouth 2 (two) times daily. Start the day after chemo for 3 days. Then take as needed for nausea or vomiting. 30 tablet 1  . oxyCODONE (OXY IR/ROXICODONE) 5 MG immediate release tablet Take  1 tablet (5 mg total) by mouth every 4 (four) hours as needed for severe pain. 30 tablet 0  . potassium chloride (K-DUR) 10 MEQ tablet Take 10 mEq by mouth 2 (two) times daily.     . RABEprazole (ACIPHEX) 20 MG tablet Take 20 mg by mouth daily.     Marland Kitchen levofloxacin (LEVAQUIN) 500 MG tablet Take 1 tablet (500 mg total) by mouth daily. (Patient not taking: Reported on 12/31/2014) 7 tablet 0  . simvastatin (ZOCOR) 20 MG tablet Take 20 mg by mouth every evening.      No current facility-administered medications for this visit.   Facility-Administered Medications Ordered in Other Visits  Medication Dose Route Frequency Provider Last Rate Last Dose  . heparin lock flush 100 unit/mL  500 Units Intravenous Once Forest Gleason, MD   500 Units at 11/20/14 1116  . sodium chloride 0.9 % injection 10 mL  10 mL Intravenous PRN Forest Gleason, MD   10 mL at 11/20/14 0916    OBJECTIVE: BP 126/64 mmHg  Pulse 88  Temp(Src) 95.2 F (35.1 C) (Tympanic)  Wt 183 lb 4 oz (83.122 kg)   Body mass index is 33.51 kg/(m^2).    ECOG FS:1 - Symptomatic but completely ambulatory  General: Well-developed, well-nourished, no acute distress. Eyes: Pink conjunctiva,  anicteric sclera. HEENT: Normocephalic, moist mucous membranes, clear oropharnyx. Lungs: Clear to auscultation bilaterally. Heart: Regular rate and rhythm. No rubs, murmurs, or gallops. Abdomen: Soft, slightly tender, nondistended. Liver palpable  Musculoskeletal: No edema, cyanosis, or clubbing. Neuro: Alert, answering all questions appropriately. Cranial nerves grossly intact. Skin: No rashes or petechiae noted. Psych: Normal affect.    LAB RESULTS:  Infusion on 12/31/2014  Component Date Value Ref Range Status  . WBC 12/31/2014 16.1* 3.6 - 11.0 K/uL Final   A-LINE DRAW  . RBC 12/31/2014 3.42* 3.80 - 5.20 MIL/uL Final  . Hemoglobin 12/31/2014 8.6* 12.0 - 16.0 g/dL Final  . HCT 12/31/2014 26.9* 35.0 - 47.0 % Final  . MCV 12/31/2014 78.7* 80.0 - 100.0 fL Final  . MCH 12/31/2014 25.1* 26.0 - 34.0 pg Final  . MCHC 12/31/2014 31.9* 32.0 - 36.0 g/dL Final  . RDW 12/31/2014 27.6* 11.5 - 14.5 % Final  . Platelets 12/31/2014 284  150 - 440 K/uL Final  . Neutrophils Relative % 12/31/2014 84   Final  . Neutro Abs 12/31/2014 13.3* 1.4 - 6.5 K/uL Final  . Lymphocytes Relative 12/31/2014 11   Final  . Lymphs Abs 12/31/2014 1.8  1.0 - 3.6 K/uL Final  . Monocytes Relative 12/31/2014 5   Final  . Monocytes Absolute 12/31/2014 0.9  0.2 - 0.9 K/uL Final  . Eosinophils Relative 12/31/2014 0   Final  . Eosinophils Absolute 12/31/2014 0.1  0 - 0.7 K/uL Final  . Basophils Relative 12/31/2014 0   Final  . Basophils Absolute 12/31/2014 0.1  0 - 0.1 K/uL Final  . Sodium 12/31/2014 133* 135 - 145 mmol/L Final  . Potassium 12/31/2014 3.8  3.5 - 5.1 mmol/L Final  . Chloride 12/31/2014 98* 101 - 111 mmol/L Final  . CO2 12/31/2014 28  22 - 32 mmol/L Final  . Glucose, Bld 12/31/2014 201* 65 - 99 mg/dL Final  . BUN 12/31/2014 28* 6 - 20 mg/dL Final  . Creatinine, Ser 12/31/2014 1.27* 0.44 - 1.00 mg/dL Final  . Calcium 12/31/2014 8.0* 8.9 - 10.3 mg/dL Final  . Total Protein 12/31/2014 6.4* 6.5 - 8.1 g/dL  Final  . Albumin 12/31/2014 3.8  3.5 -  5.0 g/dL Final  . AST 12/31/2014 27  15 - 41 U/L Final  . ALT 12/31/2014 16  14 - 54 U/L Final  . Alkaline Phosphatase 12/31/2014 147* 38 - 126 U/L Final  . Total Bilirubin 12/31/2014 0.6  0.3 - 1.2 mg/dL Final  . GFR calc non Af Amer 12/31/2014 39* >60 mL/min Final  . GFR calc Af Amer 12/31/2014 45* >60 mL/min Final   Comment: (NOTE) The eGFR has been calculated using the CKD EPI equation. This calculation has not been validated in all clinical situations. eGFR's persistently <60 mL/min signify possible Chronic Kidney Disease.   . Anion gap 12/31/2014 7  5 - 15 Final    STUDIES: No results found.  ASSESSMENT: Right upper lobe carcinoma of lung, metastasis to liver.   PLAN:   1. Lung CA. Patient is here for cycle 3 chemotherapy with carboplatin and etoposide. She has been tolerating fairly well. We'll proceed with cycle 3 chemotherapy today. Following completion of cycle 3 will schedule CT scan of chest abdomen and pelvis for reevaluation of disease in approximately 2 and half weeks. Patient will return to clinic in 3 weeks for review of scan and consideration for cycle 4 carboplatin and etoposide.  Patient expressed understanding and was in agreement with this plan. She also understands that She can call clinic at any time with any questions, concerns, or complaints.   Dr. Grayland Ormond was available for consultation and review of plan of care for this patient.  Cancer of upper lobe of right lung   Staging form: Lung, AJCC 7th Edition     Clinical: T2, N2, M1 - Signed by Forest Gleason, MD on 11/03/2014   Evlyn Kanner, NP   12/31/2014 9:54 AM

## 2014-12-31 NOTE — Progress Notes (Signed)
Patient c/o pain in right side.  Also requesting refill for ondansetron.

## 2015-01-01 ENCOUNTER — Inpatient Hospital Stay: Payer: Medicare Other

## 2015-01-01 ENCOUNTER — Ambulatory Visit: Payer: Medicare Other

## 2015-01-01 ENCOUNTER — Telehealth: Payer: Self-pay | Admitting: *Deleted

## 2015-01-01 ENCOUNTER — Ambulatory Visit: Payer: Medicare Other | Admitting: Oncology

## 2015-01-01 ENCOUNTER — Other Ambulatory Visit: Payer: Medicare Other

## 2015-01-01 DIAGNOSIS — C3411 Malignant neoplasm of upper lobe, right bronchus or lung: Secondary | ICD-10-CM

## 2015-01-01 MED ORDER — HEPARIN SOD (PORK) LOCK FLUSH 100 UNIT/ML IV SOLN
500.0000 [IU] | Freq: Once | INTRAVENOUS | Status: AC | PRN
  Administered 2015-01-01: 500 [IU]

## 2015-01-01 MED ORDER — ONDANSETRON HCL 8 MG PO TABS: 8.0000 mg | ORAL_TABLET | Freq: Two times a day (BID) | ORAL | Status: DC

## 2015-01-01 MED ORDER — SODIUM CHLORIDE 0.9 % IV SOLN
Freq: Once | INTRAVENOUS | Status: AC
  Administered 2015-01-01: 14:00:00 via INTRAVENOUS
  Filled 2015-01-01: qty 1000

## 2015-01-01 MED ORDER — SODIUM CHLORIDE 0.9 % IV SOLN
80.0000 mg/m2 | Freq: Once | INTRAVENOUS | Status: AC
  Administered 2015-01-01: 150 mg via INTRAVENOUS
  Filled 2015-01-01: qty 7.5

## 2015-01-01 MED ORDER — DEXAMETHASONE SODIUM PHOSPHATE 100 MG/10ML IJ SOLN
Freq: Once | INTRAMUSCULAR | Status: AC
  Administered 2015-01-01: 14:00:00 via INTRAVENOUS
  Filled 2015-01-01: qty 4

## 2015-01-01 NOTE — Telephone Encounter (Signed)
Escribed

## 2015-01-02 ENCOUNTER — Telehealth: Payer: Self-pay | Admitting: *Deleted

## 2015-01-02 ENCOUNTER — Inpatient Hospital Stay: Payer: Medicare Other

## 2015-01-02 VITALS — BP 112/70 | HR 75 | Temp 97.0°F | Resp 20

## 2015-01-02 DIAGNOSIS — C3411 Malignant neoplasm of upper lobe, right bronchus or lung: Secondary | ICD-10-CM | POA: Diagnosis not present

## 2015-01-02 MED ORDER — SODIUM CHLORIDE 0.9 % IV SOLN
Freq: Once | INTRAVENOUS | Status: AC
Start: 2015-01-02 — End: 2015-01-02
  Administered 2015-01-02: 14:00:00 via INTRAVENOUS
  Filled 2015-01-02: qty 1000

## 2015-01-02 MED ORDER — ETOPOSIDE CHEMO INJECTION 1 GM/50ML
80.0000 mg/m2 | Freq: Once | INTRAVENOUS | Status: AC
  Administered 2015-01-02: 150 mg via INTRAVENOUS
  Filled 2015-01-02: qty 7.5

## 2015-01-02 MED ORDER — PEGFILGRASTIM 6 MG/0.6ML ~~LOC~~ PSKT
6.0000 mg | PREFILLED_SYRINGE | Freq: Once | SUBCUTANEOUS | Status: AC
  Administered 2015-01-02: 6 mg via SUBCUTANEOUS
  Filled 2015-01-02: qty 0.6

## 2015-01-02 MED ORDER — HEPARIN SOD (PORK) LOCK FLUSH 100 UNIT/ML IV SOLN
500.0000 [IU] | Freq: Once | INTRAVENOUS | Status: AC | PRN
  Filled 2015-01-02: qty 5

## 2015-01-02 MED ORDER — SODIUM CHLORIDE 0.9 % IV SOLN: Freq: Once | INTRAVENOUS | Status: DC

## 2015-01-02 MED ORDER — SODIUM CHLORIDE 0.9 % IV SOLN
Freq: Once | INTRAVENOUS | Status: AC
  Administered 2015-01-02: 14:00:00 via INTRAVENOUS
  Filled 2015-01-02: qty 4

## 2015-01-02 NOTE — Telephone Encounter (Signed)
States she is taking Lasix 40 mg daily and needs something stronger, feels she is going into CHF. I advised her to call her cardiologist regarding this. She states she has a chemo appt this afternoon

## 2015-01-03 ENCOUNTER — Ambulatory Visit: Payer: Medicare Other

## 2015-01-04 ENCOUNTER — Telehealth: Payer: Self-pay | Admitting: *Deleted

## 2015-01-04 MED ORDER — MAGIC MOUTHWASH W/LIDOCAINE: 5.0000 mL | Freq: Four times a day (QID) | ORAL | Status: DC | PRN

## 2015-01-04 MED ORDER — HEPARIN SOD (PORK) LOCK FLUSH 100 UNIT/ML IV SOLN
500.0000 [IU] | Freq: Once | INTRAVENOUS | Status: AC
  Administered 2015-01-02: 500 [IU] via INTRAVENOUS

## 2015-01-04 MED ORDER — PROMETHAZINE HCL 25 MG PO TABS: 25.0000 mg | ORAL_TABLET | Freq: Four times a day (QID) | ORAL | Status: DC | PRN

## 2015-01-04 NOTE — Telephone Encounter (Signed)
Pt notified of rx being sent to pharmacy

## 2015-01-04 NOTE — Telephone Encounter (Signed)
Severe nausea and throat so sore that she can hardly swallow her saliva

## 2015-01-07 ENCOUNTER — Other Ambulatory Visit: Payer: Self-pay | Admitting: *Deleted

## 2015-01-07 ENCOUNTER — Telehealth: Payer: Self-pay | Admitting: *Deleted

## 2015-01-07 DIAGNOSIS — J029 Acute pharyngitis, unspecified: Secondary | ICD-10-CM

## 2015-01-07 DIAGNOSIS — C3411 Malignant neoplasm of upper lobe, right bronchus or lung: Secondary | ICD-10-CM

## 2015-01-07 DIAGNOSIS — Z9221 Personal history of antineoplastic chemotherapy: Secondary | ICD-10-CM

## 2015-01-07 MED ORDER — AZITHROMYCIN 500 MG PO TABS: 500.0000 mg | ORAL_TABLET | Freq: Every day | ORAL | Status: DC

## 2015-01-07 MED ORDER — OXYCODONE HCL 5 MG PO TABS: 5.0000 mg | ORAL_TABLET | ORAL | Status: DC | PRN

## 2015-01-07 NOTE — Telephone Encounter (Signed)
Patient calling. Requesting refill on oxycodone 5 mg tablet. Call returned. Patient needs to come to cancer center to pick up RX.  Pt also states that she has a very sore throat. She is using magic mouthwash and lidocaine and her throat still 'hurts' verbal order obtained from Dr. Oliva Bustard to call in zpac 500 mg x 3 days. If pt is not any better by the end of the week to call back.  Pt gave verbal understanding.

## 2015-01-10 ENCOUNTER — Telehealth: Payer: Self-pay | Admitting: *Deleted

## 2015-01-10 MED ORDER — FLUCONAZOLE 100 MG PO TABS: 100.0000 mg | ORAL_TABLET | Freq: Every day | ORAL | Status: DC

## 2015-01-10 NOTE — Telephone Encounter (Signed)
The med she has been taking has not helped her throat at all, states she is unable to swallow her pills and that she has white patches in her mouth. She is using medicated mouthwash.

## 2015-01-10 NOTE — Telephone Encounter (Signed)
Diflucan e scribed, patient informed of this and to continue  Med MW. Voiced understanding

## 2015-01-18 ENCOUNTER — Ambulatory Visit
Admission: RE | Admit: 2015-01-18 | Discharge: 2015-01-18 | Disposition: A | Discharge status: Home / Self Care | Payer: Medicare Other | Source: Ambulatory Visit | Attending: Family Medicine | Admitting: Family Medicine

## 2015-01-18 ENCOUNTER — Other Ambulatory Visit: Payer: Self-pay | Admitting: Family Medicine

## 2015-01-18 ENCOUNTER — Other Ambulatory Visit: Payer: Self-pay | Admitting: *Deleted

## 2015-01-18 DIAGNOSIS — I2510 Atherosclerotic heart disease of native coronary artery without angina pectoris: Secondary | ICD-10-CM | POA: Insufficient documentation

## 2015-01-18 DIAGNOSIS — R928 Other abnormal and inconclusive findings on diagnostic imaging of breast: Secondary | ICD-10-CM | POA: Diagnosis not present

## 2015-01-18 DIAGNOSIS — M5124 Other intervertebral disc displacement, thoracic region: Secondary | ICD-10-CM | POA: Diagnosis not present

## 2015-01-18 DIAGNOSIS — I517 Cardiomegaly: Secondary | ICD-10-CM | POA: Diagnosis not present

## 2015-01-18 DIAGNOSIS — K449 Diaphragmatic hernia without obstruction or gangrene: Secondary | ICD-10-CM | POA: Insufficient documentation

## 2015-01-18 DIAGNOSIS — C3411 Malignant neoplasm of upper lobe, right bronchus or lung: Secondary | ICD-10-CM

## 2015-01-18 DIAGNOSIS — M5136 Other intervertebral disc degeneration, lumbar region: Secondary | ICD-10-CM | POA: Insufficient documentation

## 2015-01-18 DIAGNOSIS — I7090 Unspecified atherosclerosis: Secondary | ICD-10-CM | POA: Insufficient documentation

## 2015-01-18 DIAGNOSIS — Z08 Encounter for follow-up examination after completed treatment for malignant neoplasm: Secondary | ICD-10-CM | POA: Insufficient documentation

## 2015-01-18 MED ORDER — OXYCODONE HCL 5 MG PO TABS: 5.0000 mg | ORAL_TABLET | ORAL | Status: DC | PRN

## 2015-01-21 ENCOUNTER — Inpatient Hospital Stay: Payer: Medicare Other | Attending: Oncology

## 2015-01-21 ENCOUNTER — Other Ambulatory Visit: Payer: Self-pay | Admitting: Family Medicine

## 2015-01-21 ENCOUNTER — Encounter: Payer: Self-pay | Admitting: Oncology

## 2015-01-21 ENCOUNTER — Inpatient Hospital Stay: Payer: Medicare Other

## 2015-01-21 ENCOUNTER — Inpatient Hospital Stay (HOSPITAL_BASED_OUTPATIENT_CLINIC_OR_DEPARTMENT_OTHER): Payer: Medicare Other | Admitting: Oncology

## 2015-01-21 VITALS — BP 147/80 | HR 75 | Temp 98.7°F | Resp 20 | Ht 62.0 in | Wt 181.0 lb

## 2015-01-21 DIAGNOSIS — I4891 Unspecified atrial fibrillation: Secondary | ICD-10-CM | POA: Diagnosis not present

## 2015-01-21 DIAGNOSIS — R1011 Right upper quadrant pain: Secondary | ICD-10-CM

## 2015-01-21 DIAGNOSIS — M545 Low back pain: Secondary | ICD-10-CM | POA: Insufficient documentation

## 2015-01-21 DIAGNOSIS — Z5111 Encounter for antineoplastic chemotherapy: Secondary | ICD-10-CM | POA: Diagnosis not present

## 2015-01-21 DIAGNOSIS — M1990 Unspecified osteoarthritis, unspecified site: Secondary | ICD-10-CM | POA: Insufficient documentation

## 2015-01-21 DIAGNOSIS — F329 Major depressive disorder, single episode, unspecified: Secondary | ICD-10-CM | POA: Diagnosis not present

## 2015-01-21 DIAGNOSIS — Z950 Presence of cardiac pacemaker: Secondary | ICD-10-CM | POA: Insufficient documentation

## 2015-01-21 DIAGNOSIS — G40909 Epilepsy, unspecified, not intractable, without status epilepticus: Secondary | ICD-10-CM | POA: Diagnosis not present

## 2015-01-21 DIAGNOSIS — Z794 Long term (current) use of insulin: Secondary | ICD-10-CM | POA: Insufficient documentation

## 2015-01-21 DIAGNOSIS — R110 Nausea: Secondary | ICD-10-CM | POA: Diagnosis not present

## 2015-01-21 DIAGNOSIS — M129 Arthropathy, unspecified: Secondary | ICD-10-CM

## 2015-01-21 DIAGNOSIS — Z86010 Personal history of colonic polyps: Secondary | ICD-10-CM | POA: Diagnosis not present

## 2015-01-21 DIAGNOSIS — Z853 Personal history of malignant neoplasm of breast: Secondary | ICD-10-CM | POA: Diagnosis not present

## 2015-01-21 DIAGNOSIS — J449 Chronic obstructive pulmonary disease, unspecified: Secondary | ICD-10-CM | POA: Insufficient documentation

## 2015-01-21 DIAGNOSIS — Z7982 Long term (current) use of aspirin: Secondary | ICD-10-CM | POA: Diagnosis not present

## 2015-01-21 DIAGNOSIS — K219 Gastro-esophageal reflux disease without esophagitis: Secondary | ICD-10-CM | POA: Insufficient documentation

## 2015-01-21 DIAGNOSIS — D649 Anemia, unspecified: Secondary | ICD-10-CM

## 2015-01-21 DIAGNOSIS — C3411 Malignant neoplasm of upper lobe, right bronchus or lung: Secondary | ICD-10-CM

## 2015-01-21 DIAGNOSIS — I10 Essential (primary) hypertension: Secondary | ICD-10-CM | POA: Insufficient documentation

## 2015-01-21 DIAGNOSIS — Z87891 Personal history of nicotine dependence: Secondary | ICD-10-CM | POA: Diagnosis not present

## 2015-01-21 DIAGNOSIS — C787 Secondary malignant neoplasm of liver and intrahepatic bile duct: Secondary | ICD-10-CM | POA: Diagnosis not present

## 2015-01-21 DIAGNOSIS — G8929 Other chronic pain: Secondary | ICD-10-CM | POA: Diagnosis not present

## 2015-01-21 DIAGNOSIS — Z79899 Other long term (current) drug therapy: Secondary | ICD-10-CM | POA: Diagnosis not present

## 2015-01-21 DIAGNOSIS — I2510 Atherosclerotic heart disease of native coronary artery without angina pectoris: Secondary | ICD-10-CM | POA: Diagnosis not present

## 2015-01-21 DIAGNOSIS — E119 Type 2 diabetes mellitus without complications: Secondary | ICD-10-CM | POA: Insufficient documentation

## 2015-01-21 DIAGNOSIS — B379 Candidiasis, unspecified: Secondary | ICD-10-CM | POA: Diagnosis not present

## 2015-01-21 LAB — COMPREHENSIVE METABOLIC PANEL
ALBUMIN: 3.7 g/dL (ref 3.5–5.0)
ALT: 14 U/L (ref 14–54)
AST: 26 U/L (ref 15–41)
Alkaline Phosphatase: 171 U/L — ABNORMAL HIGH (ref 38–126)
Anion gap: 7 (ref 5–15)
BUN: 24 mg/dL — AB (ref 6–20)
CHLORIDE: 98 mmol/L — AB (ref 101–111)
CO2: 29 mmol/L (ref 22–32)
CREATININE: 1.32 mg/dL — AB (ref 0.44–1.00)
Calcium: 8.5 mg/dL — ABNORMAL LOW (ref 8.9–10.3)
GFR calc Af Amer: 43 mL/min — ABNORMAL LOW (ref >60)
GFR calc non Af Amer: 37 mL/min — ABNORMAL LOW (ref >60)
Glucose, Bld: 177 mg/dL — ABNORMAL HIGH (ref 65–99)
POTASSIUM: 3.6 mmol/L (ref 3.5–5.1)
SODIUM: 134 mmol/L — AB (ref 135–145)
Total Bilirubin: 0.9 mg/dL (ref 0.3–1.2)
Total Protein: 6.5 g/dL (ref 6.5–8.1)

## 2015-01-21 LAB — CBC WITH DIFFERENTIAL/PLATELET
BASOS ABS: 0 10*3/uL (ref 0–0.1)
BASOS PCT: 0 %
EOS ABS: 0 10*3/uL (ref 0–0.7)
EOS PCT: 0 %
HCT: 28.6 % — ABNORMAL LOW (ref 35.0–47.0)
Hemoglobin: 8.9 g/dL — ABNORMAL LOW (ref 12.0–16.0)
LYMPHS PCT: 8 %
Lymphs Abs: 1.1 10*3/uL (ref 1.0–3.6)
MCH: 26.6 pg (ref 26.0–34.0)
MCHC: 31.1 g/dL — ABNORMAL LOW (ref 32.0–36.0)
MCV: 85.5 fL (ref 80.0–100.0)
MONO ABS: 0.9 10*3/uL (ref 0.2–0.9)
Monocytes Relative: 6 %
Neutro Abs: 11.9 10*3/uL — ABNORMAL HIGH (ref 1.4–6.5)
Neutrophils Relative %: 86 %
PLATELETS: 230 10*3/uL (ref 150–440)
RBC: 3.34 MIL/uL — AB (ref 3.80–5.20)
RDW: 32.5 % — AB (ref 11.5–14.5)
WBC: 14 10*3/uL — AB (ref 3.6–11.0)

## 2015-01-21 MED ORDER — FENTANYL 12 MCG/HR TD PT72: 12.5000 ug | MEDICATED_PATCH | TRANSDERMAL | Status: DC

## 2015-01-21 MED ORDER — HEPARIN SOD (PORK) LOCK FLUSH 100 UNIT/ML IV SOLN
500.0000 [IU] | Freq: Once | INTRAVENOUS | Status: AC
  Administered 2015-01-21: 500 [IU] via INTRAVENOUS

## 2015-01-21 MED ORDER — FLUCONAZOLE 100 MG PO TABS: 100.0000 mg | ORAL_TABLET | Freq: Every day | ORAL | Status: DC

## 2015-01-21 MED ORDER — SODIUM CHLORIDE 0.9 % IJ SOLN
10.0000 mL | INTRAMUSCULAR | Status: AC | PRN
  Administered 2015-01-21: 10 mL via INTRAVENOUS
  Filled 2015-01-21: qty 10

## 2015-01-21 MED ORDER — HEPARIN SOD (PORK) LOCK FLUSH 100 UNIT/ML IV SOLN
INTRAVENOUS | Status: AC
Start: 2015-01-21 — End: 2015-01-21
  Filled 2015-01-21: qty 5

## 2015-01-21 NOTE — Progress Notes (Signed)
[place] @ [facility] [phone]  [phone]     Wendy Giles OB: 03/13/1933  MR#: [id number]  HUT#:[id number]  Patient Care Team: Tracie Harrier, MD as PCP - General (Internal Medicine) Isaias Cowman, MD as Consulting Physician (Cardiology)  CHIEF COMPLAINT:  Chief Complaint  Patient presents with  . Chemotherapy    here for carbo/vp16- h/o lung cancer  . Epistaxis    1 episode of nose bleed; patient d/c pradaxa  . Results    discuss ct scan results       Carcinoma of breast                                AJCC Staging:  [id number]                               Stage Grouping: I                               Cancer Status: Evidence of disease.                               Estrogen receptor positive and                               progesterone receptor positive. HER- 2                               Negative. 2.  Abnormal CT scan of the chest (June, 2016) Oncology History   1.  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  Previous history of carcinoma breast stage I disease 3.  Started on chemotherapy with carboplatinum and VP-16 from November 20, 2014 MRI scan of brain was negative (July, 2016)   Patient was started on carboplatinum and VP-16 which has been discontinued in September of 2016 (received total of 3 cycles of chemotherapy) CT scan did not see any significant response 4, patient will be started on carboplatinum and CPT-11 from January 28, 2015   Oncology Flowsheet 11/22/2014 12/11/2014 12/12/2014 12/13/2014 12/31/2014 01/01/2015 01/02/2015  Day, Cycle Day 3, Cycle 1 Day 1, Cycle 2 Day 2, Cycle 2 Day 3, Cycle 2 Day 1, Cycle 3 Day 2, Cycle 3 Day 3, Cycle 3  CARBOplatin (PARAPLATIN) IV - 410 mg - - 360 mg - -  dexamethasone (DECADRON) IV [ 10 mg ] [ 12 mg ] [ 10 mg ] [ 10 mg ] [ 12 mg ] [ 10 mg ]  [ 10 mg ]  etoposide (VEPESID) IV 80 mg/m2 80 mg/m2 80 mg/m2 80 mg/m2 80 mg/m2 80 mg/m2 80 mg/m2  fosaprepitant (EMEND) IV - [ 150 mg ] - - [ 150 mg ] - -  ondansetron (ZOFRAN) IV [ 8 mg ] - [ 8 mg ] [ 8 mg ] - [ 8 mg ] [ 8 mg ]  palonosetron (ALOXI) IV - 0.25 mg - - 0.25 mg - -  pegfilgrastim (NEULASTA ONPRO KIT) [place] 6 mg - - 6 mg - - 6 mg  INTERVAL HISTORY: 81 year old lady with a previous history of carcinoma of breast also patient is a remote smoker started having cough initially was thought due to acute bronchitis.  Chest x-ray was abnormal CT scan was obtained.  Which revealed right upper lobe lung mass and right hilar lymph node.  Patient is being referred to me for further evaluation and treatment consideration. No hemoptysis.  No chest pain.  Appetite has been stable. In June, 2016 Patient is here for evaluation patient has undergone EBUS  and bronchoscopy.  Bronchoscopy specimen was negative however biopsy was consistent with small cell undifferentiated carcinoma of lung.  Patient's CT scan of the liver revealed multiple metastases in the liver.  The patient is here to discuss the results and the further planning of treatment Continues to have dry hacking cough.  Patient was started on prednisone without much relief in the cough.  No hemoptysis.  No chest pain.  No difficulty swallowing. July, 2016 Patient is here for ongoing evaluation and initiation of chemotherapy.  Recent had attended chemotherapy class.  He had a port placement.  Patient has developed some swelling around the port area but there is a good blood flow. Family had number of questions regarding treatment December 11, 2014 Patient is here for ongoing evaluation and second cycle of chemotherapy.  No chills.  No fever.  Alopecia.  No evidence of stomatitis.  Cough is improved.  Patient continues to have question about chemotherapy.  He also has lot of pain when port is used.  Appetite has been better.  No tingling.  No  numbness.. September, 2016  Patient is here for further follow-up since last evaluation patient had oral candidiasis requiring Diflucan for help.   Also had epistaxis yesterday has to stop Peridex.   Has some difficulty in maintaining balance.   Right upper quadrant pain persists.  No nausea.  No vomiting.  No diarrhea.  Had a repeat CT scan which has been evaluated independently  REVIEW OF SYSTEMS:   Gen. status: Patient is extremely apprehensive and not in any acute distress.  Not lost any significant weight. Lungs: No chest pain.  No hemoptysis.  Dry hacking cough no difficulty swallowing HEENT: No headache.  No hearing loss.  No ear pain.  No nosebleed or congestion.  No sore throat.  No difficulty swallowing Cardiovascular system: No chest pain.  No palpitation.  No paroxysmal nocturnal dyspnea.  Patient has a pacemaker has a history of atrial fibrillation on chronic anticoagulation therapy Gastro intestinal system: No heartburn.  No nausea or vomiting.  No abdominal pain.  No diarrhea.  No constipation.  No rectal bleeding. Skin: No evidence of ecchymosis or rash. Genitourinary system: No dysuria.  No hematuria.  No frequency.  No flank pain. Neurological system: No dizziness.  No tingling.  His was.  no tingling numbness.  no focal weakness or any focal signs. Psychiatric system: Patient is rvery emotional and crying after hearing state news of stage IV carcinoma of lung As per HPI. Otherwise, a complete review of systems is negatve.  PAST MEDICAL HISTORY: Past Medical History  Diagnosis Date  . CHF (congestive heart failure)   . Diabetes mellitus without complication   . Hypertension   . Presence of permanent cardiac pacemaker   . Dysrhythmia     afib  . Depression   . GERD (gastroesophageal reflux disease)   . Coronary artery disease   . Cancer     breast,uterine  . COPD (chronic obstructive pulmonary disease)   .  Arthritis   . Anemia   . Anginal pain   . Cancer of  upper lobe of right lung 11/03/2014  . Breast cancer       Surgical History            Diabetes controlled with oral medication                               Previous history of congestive heart                               failure                               Coronary artery disease with stent                               placement and bypass surgery Hypertension                               Osteoarthritis                               Colon polyps                               Chronic low back pain                                                              Past Surgical History:                               Coronary bypass surgery and stent                               placement                                Pacemaker because of atrial fibrillation                               Appendectomy                               Hemorrhoidectomy                                                              Family History:  Family history of breast cancer and colon                               cancer. History of anemia, diabetes, heart                               disease, and hypertension in the family.                                                              Social History:                               Does not smoke now. Does not drink. Used                               to smoke in the pa ADVANCED DIRECTIVES:  No flowsheet data found.  HEALTH MAINTENANCE: Social History  Substance Use Topics  . Smoking status: Former Smoker    Quit date: 10/21/1997  . Smokeless tobacco: Never Used  . Alcohol Use: No      Allergies  Allergen Reactions  . Amoxicillin-Pot Clavulanate Hives and Diarrhea  . Fentanyl Itching  . Codeine Nausea And Vomiting and Other (See Comments)    Other Reaction: "very ill"  . Latex Rash  . Other Rash    Current Outpatient Prescriptions  Medication Sig Dispense Refill  . ALPRAZolam (XANAX) 0.5 MG tablet Take 0.5  mg by mouth once a week.     Marland Kitchen aspirin 81 MG tablet Take 81 mg by mouth daily.     . cyclobenzaprine (FLEXERIL) 5 MG tablet Take 5 mg by mouth daily as needed for muscle spasms.     Marland Kitchen dicyclomine (BENTYL) 10 MG capsule Take 10 mg by mouth 3 (three) times daily as needed (abdominal pain).     . furosemide (LASIX) 40 MG tablet Take 40 mg by mouth daily.     Marland Kitchen gabapentin (NEURONTIN) 100 MG capsule Take 100 mg by mouth every morning.     Marland Kitchen glipiZIDE (GLUCOTROL) 10 MG tablet Take 10 mg by mouth 2 (two) times daily before a meal.   0  . insulin lispro (HUMALOG) 100 UNIT/ML KiwkPen as per sliding scale< than 150-- No insulin150-200---4 units 201-250---6 units251-300--- 8 units301-350----10 units.351-400---12 units    . albuterol (PROAIR HFA) 108 (90 BASE) MCG/ACT inhaler Inhale into the lungs.    . dabigatran (PRADAXA) 150 MG CAPS capsule TAKE ONE CAPSULE BY MOUTH TWICE DAILY    . fentaNYL (DURAGESIC - DOSED MCG/HR) 12 MCG/HR Place 1 patch (12.5 mcg total) onto the skin every 3 (three) days. 10 patch 0  . fluconazole (DIFLUCAN) 100 MG tablet Take 1 tablet (100 mg total) by mouth daily. 5 tablet 0  . isosorbide mononitrate (IMDUR) 60 MG 24 hr tablet Take 60 mg by mouth daily.     Marland Kitchen levothyroxine (SYNTHROID, LEVOTHROID) 25 MCG tablet Take 25 mcg by mouth daily before breakfast.     . lidocaine-prilocaine (EMLA) cream Apply 1 application topically as needed. 30 g 3  . Liraglutide  18 MG/3ML SOPN Inject 3 mLs into the skin daily.     Marland Kitchen losartan (COZAAR) 100 MG tablet Take 100 mg by mouth daily.     . magic mouthwash w/lidocaine SOLN Take 5 mLs by mouth 4 (four) times daily as needed for mouth pain. 480 mL 3  . Magnesium-Calcium-Folic Acid 601-093-2 MG TABS Take 1 tablet by mouth daily.     . metFORMIN (GLUCOPHAGE) 1000 MG tablet Take 1,000 mg by mouth 2 (two) times daily.     . metoprolol-hydrochlorothiazide (LOPRESSOR HCT) 50-25 MG per tablet Take 1 tablet by mouth daily.     . nitroGLYCERIN (NITROSTAT)  0.4 MG SL tablet Place 0.4 mg under the tongue every 5 (five) minutes as needed for chest pain.     Marland Kitchen ondansetron (ZOFRAN) 8 MG tablet Take 1 tablet (8 mg total) by mouth 2 (two) times daily. Start the day after chemo for 3 days. Then take as needed for nausea or vomiting. 30 tablet 2  . oxyCODONE (OXY IR/ROXICODONE) 5 MG immediate release tablet Take 1 tablet (5 mg total) by mouth every 4 (four) hours as needed for severe pain. 30 tablet 0  . potassium chloride (K-DUR) 10 MEQ tablet Take 10 mEq by mouth 2 (two) times daily.     . promethazine (PHENERGAN) 25 MG tablet Take 1 tablet (25 mg total) by mouth every 6 (six) hours as needed for nausea or vomiting. 30 tablet 0  . RABEprazole (ACIPHEX) 20 MG tablet Take 20 mg by mouth daily.     . simvastatin (ZOCOR) 20 MG tablet Take 20 mg by mouth every evening.      No current facility-administered medications for this visit.   Facility-Administered Medications Ordered in Other Visits  Medication Dose Route Frequency Provider Last Rate Last Dose  . heparin lock flush 100 unit/mL  500 Units Intravenous Once Forest Gleason, MD   500 Units at 11/20/14 1116  . sodium chloride 0.9 % injection 10 mL  10 mL Intravenous PRN Forest Gleason, MD   10 mL at 11/20/14 0916    OBJECTIVE:  Filed Vitals:   01/21/15 0848  BP: 147/80  Pulse: 75  Temp: 98.7 F (37.1 C)  Resp: 20     Body mass index is 33.1 kg/(m^2).    ECOG FS:1 - Symptomatic but completely ambulatory  PHYSICAL EXAM: General  status: Performance status is good.  Patient has not lost significant weight HEENT: No evidence of stomatitis. Sclera and conjunctivae :: No jaundice.   pale looking. Lungs: Air  entry equal on both sides.  No rhonchi.  No rales.  Cardiac: Heart sounds are normal.  No pericardial rub.  No murmur. There is some swelling above port.  Soft.  No fluid.  No redness or tenderness. Lymphatic system: Cervical, axillary, inguinal, lymph nodes not palpable GI: Abdomen is soft.  No  ascites.  Liver spleen not palpable.  No tenderness.  Bowel sounds are within normal limit Lower extremity: No edema Neurological system: Higher functions, cranial nerves intact no evidence of peripheral neuropathy. Skin: No rash.  No ecchymosis.. Examination of both breasts.  Patient had down previous history of carcinoma of left breast no palpable masses axillary lymph nodes are normal.  Right breast free of masses.    LAB RESULTS:   Lab has been reviewed.    ASSESSMENT: 1.  Right upper lobe carcinoma of lung.  Biopsy is positive for small cell undifferentiated tumor CT scan of the abdomen shows multiple liver metastases Stage  IV disease CT scan has been reviewed independently sows no significant response  MEDICAL DECISION MAKING:  MRI scan of brain has been reviewed there is no evidence of metastases.  Intent of chemotherapy is palliation and relief in symptoms and extending survival. Epistaxis: He has resolved patient was advised to stop Pradaxa for 3 days and go back on it continue aspirin Candidiasis Responded to Diflucan.  Patient was given prescription for Diflucan to start the patient develops any soreness in the mouth Right upper quadrant pain Advised to start fentanyl patch 12 g with oxycodone for pain control Discussed possibility of changing chemotherapy to carboplatinum and CPT-11 Patient was explained all the side effects of chemotherapy.  And informed consent has been obtained  CT scan was reviewed independently and reviewed with the patient   No matching staging information was found for the patient.  Forest Gleason, MD   01/21/2015 9:12 AM

## 2015-01-28 ENCOUNTER — Telehealth: Payer: Self-pay | Admitting: Pharmacist

## 2015-01-28 ENCOUNTER — Inpatient Hospital Stay: Payer: Medicare Other

## 2015-01-28 DIAGNOSIS — C3411 Malignant neoplasm of upper lobe, right bronchus or lung: Secondary | ICD-10-CM

## 2015-01-28 MED ORDER — OXYCODONE HCL 5 MG PO TABS: 5.0000 mg | ORAL_TABLET | ORAL | Status: DC | PRN

## 2015-01-28 MED ORDER — PALONOSETRON HCL INJECTION 0.25 MG/5ML
0.2500 mg | Freq: Once | INTRAVENOUS | Status: AC
  Administered 2015-01-28: 0.25 mg via INTRAVENOUS
  Filled 2015-01-28: qty 5

## 2015-01-28 MED ORDER — SODIUM CHLORIDE 0.9 % IJ SOLN
10.0000 mL | INTRAMUSCULAR | Status: DC | PRN
  Administered 2015-01-28: 10 mL
  Filled 2015-01-28: qty 10

## 2015-01-28 MED ORDER — DEXTROSE 5 % IV SOLN
65.0000 mg/m2 | Freq: Once | INTRAVENOUS | Status: AC
  Administered 2015-01-28: 124 mg via INTRAVENOUS
  Filled 2015-01-28: qty 6.2

## 2015-01-28 MED ORDER — FOSAPREPITANT DIMEGLUMINE INJECTION 150 MG
Freq: Once | INTRAVENOUS | Status: AC
  Administered 2015-01-28: 10:00:00 via INTRAVENOUS
  Filled 2015-01-28: qty 5

## 2015-01-28 MED ORDER — ATROPINE SULFATE 0.4 MG/ML IJ SOLN
0.4000 mg | Freq: Once | INTRAMUSCULAR | Status: AC | PRN
  Administered 2015-01-28: 0.4 mg via INTRAVENOUS
  Filled 2015-01-28: qty 1

## 2015-01-28 MED ORDER — SODIUM CHLORIDE 0.9 % IV SOLN
349.0000 mg | Freq: Once | INTRAVENOUS | Status: AC
  Administered 2015-01-28: 350 mg via INTRAVENOUS
  Filled 2015-01-28: qty 35

## 2015-01-28 MED ORDER — SODIUM CHLORIDE 0.9 % IV SOLN
Freq: Once | INTRAVENOUS | Status: AC
  Administered 2015-01-28: 09:00:00 via INTRAVENOUS
  Filled 2015-01-28: qty 1000

## 2015-01-28 MED ORDER — HEPARIN SOD (PORK) LOCK FLUSH 100 UNIT/ML IV SOLN
500.0000 [IU] | Freq: Once | INTRAVENOUS | Status: AC | PRN
  Administered 2015-01-28: 500 [IU]
  Filled 2015-01-28: qty 5

## 2015-01-28 NOTE — Telephone Encounter (Signed)
Spoke with Dr  Metro Kung nurse regarding treatment plan. Dr Oliva Bustard wants Carbo, Irinotecan. Treatment plan is for cisplatin, irinotecan. Nurse stated Dr Oliva Bustard is aware and will change treatment plan in computer.

## 2015-02-04 ENCOUNTER — Inpatient Hospital Stay: Payer: Medicare Other

## 2015-02-04 ENCOUNTER — Inpatient Hospital Stay (HOSPITAL_BASED_OUTPATIENT_CLINIC_OR_DEPARTMENT_OTHER): Payer: Medicare Other | Admitting: Oncology

## 2015-02-04 ENCOUNTER — Encounter: Payer: Self-pay | Admitting: Oncology

## 2015-02-04 VITALS — BP 145/80 | HR 92 | Temp 98.1°F | Wt 173.0 lb

## 2015-02-04 DIAGNOSIS — C787 Secondary malignant neoplasm of liver and intrahepatic bile duct: Secondary | ICD-10-CM

## 2015-02-04 DIAGNOSIS — I10 Essential (primary) hypertension: Secondary | ICD-10-CM

## 2015-02-04 DIAGNOSIS — I4891 Unspecified atrial fibrillation: Secondary | ICD-10-CM

## 2015-02-04 DIAGNOSIS — Z794 Long term (current) use of insulin: Secondary | ICD-10-CM

## 2015-02-04 DIAGNOSIS — G40909 Epilepsy, unspecified, not intractable, without status epilepticus: Secondary | ICD-10-CM

## 2015-02-04 DIAGNOSIS — Z853 Personal history of malignant neoplasm of breast: Secondary | ICD-10-CM

## 2015-02-04 DIAGNOSIS — Z7982 Long term (current) use of aspirin: Secondary | ICD-10-CM

## 2015-02-04 DIAGNOSIS — R110 Nausea: Secondary | ICD-10-CM | POA: Diagnosis not present

## 2015-02-04 DIAGNOSIS — R1011 Right upper quadrant pain: Secondary | ICD-10-CM | POA: Diagnosis not present

## 2015-02-04 DIAGNOSIS — K219 Gastro-esophageal reflux disease without esophagitis: Secondary | ICD-10-CM

## 2015-02-04 DIAGNOSIS — Z79899 Other long term (current) drug therapy: Secondary | ICD-10-CM

## 2015-02-04 DIAGNOSIS — J449 Chronic obstructive pulmonary disease, unspecified: Secondary | ICD-10-CM

## 2015-02-04 DIAGNOSIS — Z950 Presence of cardiac pacemaker: Secondary | ICD-10-CM

## 2015-02-04 DIAGNOSIS — I2510 Atherosclerotic heart disease of native coronary artery without angina pectoris: Secondary | ICD-10-CM

## 2015-02-04 DIAGNOSIS — M1990 Unspecified osteoarthritis, unspecified site: Secondary | ICD-10-CM

## 2015-02-04 DIAGNOSIS — C3411 Malignant neoplasm of upper lobe, right bronchus or lung: Secondary | ICD-10-CM

## 2015-02-04 DIAGNOSIS — D649 Anemia, unspecified: Secondary | ICD-10-CM

## 2015-02-04 DIAGNOSIS — Z86010 Personal history of colonic polyps: Secondary | ICD-10-CM

## 2015-02-04 DIAGNOSIS — F329 Major depressive disorder, single episode, unspecified: Secondary | ICD-10-CM

## 2015-02-04 DIAGNOSIS — E119 Type 2 diabetes mellitus without complications: Secondary | ICD-10-CM

## 2015-02-04 DIAGNOSIS — Z87891 Personal history of nicotine dependence: Secondary | ICD-10-CM

## 2015-02-04 DIAGNOSIS — M545 Low back pain: Secondary | ICD-10-CM

## 2015-02-04 DIAGNOSIS — M129 Arthropathy, unspecified: Secondary | ICD-10-CM

## 2015-02-04 DIAGNOSIS — G8929 Other chronic pain: Secondary | ICD-10-CM

## 2015-02-04 LAB — COMPREHENSIVE METABOLIC PANEL
ALT: 18 U/L (ref 14–54)
ANION GAP: 6 (ref 5–15)
AST: 35 U/L (ref 15–41)
Albumin: 3.7 g/dL (ref 3.5–5.0)
Alkaline Phosphatase: 146 U/L — ABNORMAL HIGH (ref 38–126)
BILIRUBIN TOTAL: 0.7 mg/dL (ref 0.3–1.2)
BUN: 22 mg/dL — ABNORMAL HIGH (ref 6–20)
CHLORIDE: 101 mmol/L (ref 101–111)
CO2: 26 mmol/L (ref 22–32)
Calcium: 8.3 mg/dL — ABNORMAL LOW (ref 8.9–10.3)
Creatinine, Ser: 0.86 mg/dL (ref 0.44–1.00)
Glucose, Bld: 233 mg/dL — ABNORMAL HIGH (ref 65–99)
POTASSIUM: 3.5 mmol/L (ref 3.5–5.1)
Sodium: 133 mmol/L — ABNORMAL LOW (ref 135–145)
TOTAL PROTEIN: 6.5 g/dL (ref 6.5–8.1)

## 2015-02-04 LAB — CBC WITH DIFFERENTIAL/PLATELET
BASOS ABS: 0 10*3/uL (ref 0–0.1)
Basophils Relative: 0 %
EOS PCT: 2 %
Eosinophils Absolute: 0.1 10*3/uL (ref 0–0.7)
HEMATOCRIT: 27.3 % — AB (ref 35.0–47.0)
Hemoglobin: 8.9 g/dL — ABNORMAL LOW (ref 12.0–16.0)
LYMPHS PCT: 17 %
Lymphs Abs: 0.8 10*3/uL — ABNORMAL LOW (ref 1.0–3.6)
MCH: 28.6 pg (ref 26.0–34.0)
MCHC: 32.8 g/dL (ref 32.0–36.0)
MCV: 87.2 fL (ref 80.0–100.0)
MONO ABS: 0.1 10*3/uL — AB (ref 0.2–0.9)
MONOS PCT: 3 %
Neutro Abs: 3.6 10*3/uL (ref 1.4–6.5)
Neutrophils Relative %: 78 %
PLATELETS: 156 10*3/uL (ref 150–440)
RBC: 3.13 MIL/uL — ABNORMAL LOW (ref 3.80–5.20)
RDW: 29.2 % — AB (ref 11.5–14.5)
WBC: 4.6 10*3/uL (ref 3.6–11.0)

## 2015-02-04 LAB — MAGNESIUM: MAGNESIUM: 1.6 mg/dL — AB (ref 1.7–2.4)

## 2015-02-04 MED ORDER — IRINOTECAN HCL CHEMO INJECTION 100 MG/5ML
65.0000 mg/m2 | Freq: Once | INTRAVENOUS | Status: AC
  Administered 2015-02-04: 124 mg via INTRAVENOUS
  Filled 2015-02-04: qty 6.2

## 2015-02-04 MED ORDER — FENTANYL 12 MCG/HR TD PT72: 12.5000 ug | MEDICATED_PATCH | TRANSDERMAL | Status: DC

## 2015-02-04 MED ORDER — HEPARIN SOD (PORK) LOCK FLUSH 100 UNIT/ML IV SOLN
500.0000 [IU] | Freq: Once | INTRAVENOUS | Status: AC
  Administered 2015-02-04: 500 [IU] via INTRAVENOUS
  Filled 2015-02-04: qty 5

## 2015-02-04 MED ORDER — SODIUM CHLORIDE 0.9 % IJ SOLN
10.0000 mL | Freq: Once | INTRAMUSCULAR | Status: AC
  Administered 2015-02-04: 10 mL via INTRAVENOUS
  Filled 2015-02-04: qty 10

## 2015-02-04 MED ORDER — ATROPINE SULFATE 0.4 MG/ML IJ SOLN
0.4000 mg | Freq: Once | INTRAMUSCULAR | Status: AC | PRN
  Administered 2015-02-04: 0.4 mg via INTRAVENOUS
  Filled 2015-02-04: qty 1

## 2015-02-04 MED ORDER — SODIUM CHLORIDE 0.9 % IV SOLN
Freq: Once | INTRAVENOUS | Status: AC
  Administered 2015-02-04: 10:00:00 via INTRAVENOUS
  Filled 2015-02-04: qty 8

## 2015-02-04 MED ORDER — SODIUM CHLORIDE 0.9 % IV SOLN
Freq: Once | INTRAVENOUS | Status: AC
  Administered 2015-02-04: 09:00:00 via INTRAVENOUS
  Filled 2015-02-04: qty 1000

## 2015-02-04 NOTE — Progress Notes (Signed)
Patient does not have living will.  Former smoker.  Patient requesting refill for Fentanyl 12 mcg.

## 2015-02-04 NOTE — Progress Notes (Signed)
[place] @ [facility] [phone]  [phone]     Wendy Giles OB: August 12, 1932  MR#: [id number]  WHQ#:[id number]  Patient Care Team: Tracie Harrier, MD as PCP - General (Internal Medicine) Isaias Cowman, MD as Consulting Physician (Cardiology)  CHIEF COMPLAINT:  Chief Complaint  Patient presents with  . OTHER       Carcinoma of breast                                AJCC Staging:  [id number]                               Stage Grouping: I                               Cancer Status: Evidence of disease.                               Estrogen receptor positive and                               progesterone receptor positive. HER- 2                               Negative. 2.  Abnormal CT scan of the chest (June, 2016) Oncology History   1.  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  Previous history of carcinoma breast stage I disease 3.  Started on chemotherapy with carboplatinum and VP-16 from November 20, 2014 MRI scan of brain was negative (July, 2016)   Patient was started on carboplatinum and VP-16 which has been discontinued in September of 2016 (received total of 3 cycles of chemotherapy) CT scan did not see any significant response 4, patient will be started on carboplatinum and CPT-11 from January 28, 2015   Oncology Flowsheet 12/11/2014 12/12/2014 12/13/2014 12/31/2014 01/01/2015 01/02/2015 01/28/2015  Day, Cycle Day 1, Cycle 2 Day 2, Cycle 2 Day 3, Cycle 2 Day 1, Cycle 3 Day 2, Cycle 3 Day 3, Cycle 3 Day 1, Cycle 1  CARBOplatin (PARAPLATIN) IV 410 mg - - 360 mg - - 350 mg  dexamethasone (DECADRON) IV [ 12 mg ] [ 10 mg ] [ 10 mg ] [ 12 mg ] [ 10 mg ] [ 10 mg ] [ 12 mg ]  etoposide (VEPESID) IV 80 mg/m2 80 mg/m2 80 mg/m2 80 mg/m2 80 mg/m2 80 mg/m2 -  fosaprepitant (EMEND) IV [ 150 mg ] - - [ 150 mg ] -  - [ 150 mg ]  irinotecan (CAMPTOSAR) IV - - - - - - 65 mg/m2  ondansetron (ZOFRAN) IV - [ 8 mg ] [ 8 mg ] - [ 8 mg ] [ 8 mg ] -  palonosetron (ALOXI) IV 0.25 mg - - 0.25 mg - - 0.25 mg  pegfilgrastim (NEULASTA ONPRO KIT) [place] - - 6 mg - - 6 mg -    INTERVAL HISTORY: 81 year old lady with 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  Patient started new chemotherapy  with carboplatinum and CPT-11 tolerated treatment very well.  Right upper quadrant pain is improved following fentanyl patch.  No nausea.  No vomiting.  No diarrhea.  According to patient's and was somewhat more nauseous during this treatment.  Appetite remains stable.  No tingling.  No numbness.  No cough. REVIEW OF SYSTEMS:   Gen. status: Patient is extremely apprehensive and not in any acute distress.  Not lost any significant weight. Lungs: No chest pain.  No hemoptysis.  Dry hacking cough, improved. .  no difficulty swallowing HEENT: No headache.  No hearing loss.  No ear pain.  No nosebleed or congestion.  No sore throat.  No difficulty swallowing Cardiovascular system: No chest pain.  No palpitation.  No paroxysmal nocturnal dyspnea.  Patient has a pacemaker has a history of atrial fibrillation on chronic anticoagulation therapy Gastro intestinal system: Right upper quadrant pain has improved after fentanyl patch was started No heartburn.  No nausea or vomiting.  No abdominal pain.  No diarrhea.  No constipation.  No rectal bleeding. Skin: Patient fell and had an abrasion in the left upper extremity No evidence of ecchymosis or rash. Genitourinary system: No dysuria.  No hematuria.  No frequency.  No flank pain. Neurological system: No dizziness.  No tingling.  His was.  no tingling numbness.  no focal weakness or any focal signs. Depression has improved ve.  PAST MEDICAL HISTORY: Past Medical History  Diagnosis Date  . CHF (congestive heart failure)   . Diabetes mellitus without complication   . Hypertension    . Presence of permanent cardiac pacemaker   . Dysrhythmia     afib  . Depression   . GERD (gastroesophageal reflux disease)   . Coronary artery disease   . Cancer     breast,uterine  . COPD (chronic obstructive pulmonary disease)   . Arthritis   . Anemia   . Anginal pain   . Cancer of upper lobe of right lung 11/03/2014  . Breast cancer       Surgical History            Diabetes controlled with oral medication                               Previous history of congestive heart                               failure                               Coronary artery disease with stent                               placement and bypass surgery Hypertension                               Osteoarthritis                               Colon polyps                               Chronic low back pain  Past Surgical History:                               Coronary bypass surgery and stent                               placement                                Pacemaker because of atrial fibrillation                               Appendectomy                               Hemorrhoidectomy                                                              Family History:                               Family history of breast cancer and colon                               cancer. History of anemia, diabetes, heart                               disease, and hypertension in the family.                                                              Social History:                               Does not smoke now. Does not drink. Used                               to smoke in the pa ADVANCED DIRECTIVES:  No flowsheet data found.  HEALTH MAINTENANCE: Social History  Substance Use Topics  . Smoking status: Former Smoker    Quit date: 10/21/1997  . Smokeless tobacco: Never Used  . Alcohol Use: No      Allergies  Allergen Reactions  .  Amoxicillin-Pot Clavulanate Hives and Diarrhea  . Fentanyl Itching  . Codeine Nausea And Vomiting and Other (See Comments)    Other Reaction: "very ill"  . Latex Rash  . Other Rash    Current Outpatient Prescriptions  Medication Sig Dispense Refill  . albuterol (PROAIR HFA) 108 (90 BASE) MCG/ACT inhaler Inhale into the lungs.    . ALPRAZolam (XANAX) 0.5 MG tablet Take 0.5 mg by mouth once  a week.     Marland Kitchen aspirin 81 MG tablet Take 81 mg by mouth daily.     . cyclobenzaprine (FLEXERIL) 5 MG tablet Take 5 mg by mouth daily as needed for muscle spasms.     . dabigatran (PRADAXA) 150 MG CAPS capsule TAKE ONE CAPSULE BY MOUTH TWICE DAILY    . dicyclomine (BENTYL) 10 MG capsule Take 10 mg by mouth 3 (three) times daily as needed (abdominal pain).     . fentaNYL (DURAGESIC - DOSED MCG/HR) 12 MCG/HR Place 1 patch (12.5 mcg total) onto the skin every 3 (three) days. 10 patch 0  . fluconazole (DIFLUCAN) 100 MG tablet Take 1 tablet (100 mg total) by mouth daily. 5 tablet 0  . furosemide (LASIX) 40 MG tablet Take 40 mg by mouth daily.     Marland Kitchen gabapentin (NEURONTIN) 100 MG capsule Take 100 mg by mouth every morning.     Marland Kitchen glipiZIDE (GLUCOTROL) 10 MG tablet Take 10 mg by mouth 2 (two) times daily before a meal.   0  . insulin lispro (HUMALOG) 100 UNIT/ML KiwkPen as per sliding scale< than 150-- No insulin150-200---4 units 201-250---6 units251-300--- 8 units301-350----10 units.351-400---12 units    . isosorbide mononitrate (IMDUR) 60 MG 24 hr tablet Take 60 mg by mouth daily.     Marland Kitchen levothyroxine (SYNTHROID, LEVOTHROID) 25 MCG tablet Take 25 mcg by mouth daily before breakfast.     . lidocaine-prilocaine (EMLA) cream Apply 1 application topically as needed. 30 g 3  . Liraglutide 18 MG/3ML SOPN Inject 3 mLs into the skin daily.     Marland Kitchen losartan (COZAAR) 100 MG tablet Take 100 mg by mouth daily.     . magic mouthwash w/lidocaine SOLN Take 5 mLs by mouth 4 (four) times daily as needed for mouth pain. 480 mL 3  .  Magnesium-Calcium-Folic Acid 852-778-2 MG TABS Take 1 tablet by mouth daily.     . metFORMIN (GLUCOPHAGE) 1000 MG tablet Take 1,000 mg by mouth 2 (two) times daily.     . metoprolol-hydrochlorothiazide (LOPRESSOR HCT) 50-25 MG per tablet Take 1 tablet by mouth daily.     . nitroGLYCERIN (NITROSTAT) 0.4 MG SL tablet Place 0.4 mg under the tongue every 5 (five) minutes as needed for chest pain.     Marland Kitchen ondansetron (ZOFRAN) 8 MG tablet Take 1 tablet (8 mg total) by mouth 2 (two) times daily. Start the day after chemo for 3 days. Then take as needed for nausea or vomiting. 30 tablet 2  . oxyCODONE (OXY IR/ROXICODONE) 5 MG immediate release tablet Take 1 tablet (5 mg total) by mouth every 4 (four) hours as needed for severe pain. 30 tablet 0  . potassium chloride (K-DUR) 10 MEQ tablet Take 10 mEq by mouth 2 (two) times daily.     . promethazine (PHENERGAN) 25 MG tablet TAKE ONE TABLET BY MOUTH EVERY SIX HOURSAS NEEDED FOR NAUSEA OR VOMITING 30 tablet 0  . RABEprazole (ACIPHEX) 20 MG tablet Take 20 mg by mouth daily.     . simvastatin (ZOCOR) 20 MG tablet Take 20 mg by mouth every evening.      No current facility-administered medications for this visit.   Facility-Administered Medications Ordered in Other Visits  Medication Dose Route Frequency Provider Last Rate Last Dose  . heparin lock flush 100 unit/mL  500 Units Intravenous Once Forest Gleason, MD   500 Units at 11/20/14 1116  . heparin lock flush 100 unit/mL  500 Units Intravenous Once Forest Gleason, MD      .  sodium chloride 0.9 % injection 10 mL  10 mL Intravenous PRN Forest Gleason, MD   10 mL at 11/20/14 0916  . sodium chloride 0.9 % injection 10 mL  10 mL Intravenous PRN Forest Gleason, MD   10 mL at 01/21/15 0915  . sodium chloride 0.9 % injection 10 mL  10 mL Intravenous Once Forest Gleason, MD        OBJECTIVE:  Filed Vitals:   02/04/15 0827  BP: 145/80  Pulse: 92  Temp: 98.1 F (36.7 C)     Body mass index is 31.63 kg/(m^2).    ECOG FS:1  - Symptomatic but completely ambulatory  PHYSICAL EXAM: General  status: Performance status is good.  Patient has not lost significant weight HEENT: No evidence of stomatitis. Sclera and conjunctivae :: No jaundice.   pale looking. Lungs: Air  entry equal on both sides.  No rhonchi.  No rales.  Cardiac: Heart sounds are normal.  No pericardial rub.  No murmur. There is some swelling above port.  Soft.  No fluid.  No redness or tenderness. Lymphatic system: Cervical, axillary, inguinal, lymph nodes not palpable GI: Abdomen is soft.  No ascites.  Liver spleen not palpable.  No tenderness.  Bowel sounds are within normal limit Lower extremity: No edema Neurological system: Higher functions, cranial nerves intact no evidence of peripheral neuropathy. Skin: No rash.  No ecchymosis.. Examination of both breasts.  Patient had down previous history of carcinoma of left breast no palpable masses axillary lymph nodes are normal.  Right breast free of masses.    LAB RESULTS:   Lab has been reviewed.    ASSESSMENT: 1.  Right upper lobe carcinoma of lung.  Biopsy is positive for small cell undifferentiated tumor CT scan of the abdomen shows multiple liver metastases Stage IV disease Because of progressing disease based on CT scan patient has been switched over to carboplatinum and CPT-11 Patient had her day 1 chemotherapy last week which she tolerated very well.  Except for moderate nausea.  MEDICAL DECISION MAKING:  All lab data has been reviewed. Right upper quadrant pain, fentanyl patches been refilled fentanyl patch is helping somewhat with the pain. Moderate nausea will send patient Zofran prescription and patient was encouraged to use that. Continue day 8 CPT-11 chemotherapy  Anemia, multifactorial will be reassessed No matching staging information was found for the patient.  Forest Gleason, MD   02/04/2015 8:45 AM

## 2015-02-06 ENCOUNTER — Telehealth: Payer: Self-pay | Admitting: *Deleted

## 2015-02-06 DIAGNOSIS — Z5189 Encounter for other specified aftercare: Secondary | ICD-10-CM

## 2015-02-06 DIAGNOSIS — R197 Diarrhea, unspecified: Secondary | ICD-10-CM

## 2015-02-06 MED ORDER — DIPHENOXYLATE-ATROPINE 2.5-0.025 MG PO TABS: 1.0000 | ORAL_TABLET | Freq: Four times a day (QID) | ORAL | Status: DC | PRN

## 2015-02-06 NOTE — Telephone Encounter (Signed)
Having diarrhea since yesterday 5 times yesterday and once today Using Imodium AD and not helping, states has used 1/2 a bottle of Imodium AD already

## 2015-02-06 NOTE — Telephone Encounter (Signed)
Patient notified that med was called into pharmacy and she has agreed to appt tomorrow morning at 800

## 2015-02-07 ENCOUNTER — Inpatient Hospital Stay: Payer: Medicare Other | Admitting: Oncology

## 2015-02-07 ENCOUNTER — Inpatient Hospital Stay: Payer: Medicare Other

## 2015-02-08 ENCOUNTER — Telehealth: Payer: Self-pay | Admitting: *Deleted

## 2015-02-08 NOTE — Telephone Encounter (Signed)
Continues to have diarrhea has tried Imodium and Lomotil.

## 2015-02-08 NOTE — Telephone Encounter (Signed)
Per L Herring, AGNP-C, she is to take lomotil after each bm. I called her and gave her these instructions and she said but the bottle says every 4 hours. I asked if she was going more than every 4 hours and she said yes so I told her again to take one tab after each bm and disregard what the bottle says, she said "oh thank you, I am so sore down there"

## 2015-02-10 ENCOUNTER — Emergency Department
Admission: EM | Admit: 2015-02-10 | Discharge: 2015-02-10 | Disposition: A | Discharge status: Home / Self Care | Payer: Medicare Other | Attending: Emergency Medicine | Admitting: Emergency Medicine

## 2015-02-10 DIAGNOSIS — D696 Thrombocytopenia, unspecified: Secondary | ICD-10-CM

## 2015-02-10 DIAGNOSIS — E119 Type 2 diabetes mellitus without complications: Secondary | ICD-10-CM | POA: Diagnosis not present

## 2015-02-10 DIAGNOSIS — Z91040 Latex allergy status: Secondary | ICD-10-CM | POA: Diagnosis not present

## 2015-02-10 DIAGNOSIS — R197 Diarrhea, unspecified: Secondary | ICD-10-CM | POA: Diagnosis not present

## 2015-02-10 DIAGNOSIS — E860 Dehydration: Secondary | ICD-10-CM

## 2015-02-10 DIAGNOSIS — Z87891 Personal history of nicotine dependence: Secondary | ICD-10-CM | POA: Insufficient documentation

## 2015-02-10 DIAGNOSIS — I10 Essential (primary) hypertension: Secondary | ICD-10-CM | POA: Diagnosis not present

## 2015-02-10 DIAGNOSIS — Z880 Allergy status to penicillin: Secondary | ICD-10-CM | POA: Diagnosis not present

## 2015-02-10 LAB — URINALYSIS COMPLETE WITH MICROSCOPIC (ARMC ONLY)
BILIRUBIN URINE: NEGATIVE
GLUCOSE, UA: NEGATIVE mg/dL
Hgb urine dipstick: NEGATIVE
Leukocytes, UA: NEGATIVE
Nitrite: NEGATIVE
PH: 5 (ref 5.0–8.0)
Protein, ur: NEGATIVE mg/dL
Specific Gravity, Urine: 1.017 (ref 1.005–1.030)

## 2015-02-10 LAB — COMPREHENSIVE METABOLIC PANEL
ALBUMIN: 3.6 g/dL (ref 3.5–5.0)
ALK PHOS: 167 U/L — AB (ref 38–126)
ALT: 15 U/L (ref 14–54)
ANION GAP: 4 — AB (ref 5–15)
AST: 25 U/L (ref 15–41)
BUN: 16 mg/dL (ref 6–20)
CALCIUM: 8.6 mg/dL — AB (ref 8.9–10.3)
CO2: 25 mmol/L (ref 22–32)
Chloride: 106 mmol/L (ref 101–111)
Creatinine, Ser: 0.76 mg/dL (ref 0.44–1.00)
GFR calc Af Amer: >60 mL/min (ref >60)
GFR calc non Af Amer: >60 mL/min (ref >60)
GLUCOSE: 147 mg/dL — AB (ref 65–99)
Potassium: 3.6 mmol/L (ref 3.5–5.1)
SODIUM: 135 mmol/L (ref 135–145)
Total Bilirubin: 1 mg/dL (ref 0.3–1.2)
Total Protein: 6.2 g/dL — ABNORMAL LOW (ref 6.5–8.1)

## 2015-02-10 LAB — CBC
HCT: 27.8 % — ABNORMAL LOW (ref 35.0–47.0)
Hemoglobin: 8.9 g/dL — ABNORMAL LOW (ref 12.0–16.0)
MCH: 28.9 pg (ref 26.0–34.0)
MCHC: 31.9 g/dL — ABNORMAL LOW (ref 32.0–36.0)
MCV: 90.5 fL (ref 80.0–100.0)
PLATELETS: 89 10*3/uL — AB (ref 150–440)
RBC: 3.07 MIL/uL — ABNORMAL LOW (ref 3.80–5.20)
RDW: 28.2 % — AB (ref 11.5–14.5)
WBC: 3 10*3/uL — ABNORMAL LOW (ref 3.6–11.0)

## 2015-02-10 LAB — LIPASE, BLOOD: Lipase: 29 U/L (ref 22–51)

## 2015-02-10 MED ORDER — ONDANSETRON HCL 4 MG/2ML IJ SOLN
4.0000 mg | Freq: Once | INTRAMUSCULAR | Status: AC
  Administered 2015-02-10: 4 mg via INTRAVENOUS
  Filled 2015-02-10: qty 2

## 2015-02-10 MED ORDER — SODIUM CHLORIDE 0.9 % IV SOLN
Freq: Once | INTRAVENOUS | Status: AC
  Administered 2015-02-10: 22:00:00 via INTRAVENOUS

## 2015-02-10 MED ORDER — SODIUM CHLORIDE 0.9 % IV SOLN: Freq: Once | INTRAVENOUS | Status: DC

## 2015-02-10 NOTE — ED Provider Notes (Signed)
[facility] Emergency Department Provider Note     Time seen: ----------------------------------------- 9:34 PM on 02/10/2015 -----------------------------------------    I have reviewed the triage vital signs and the nursing notes.   HISTORY  Chief Complaint Diarrhea and Cancer    HPI Wendy Giles is a 81 y.o. female who presents ER after having severe diarrhea since Monday. Patient states she started new chemotherapeutic agent on Monday and is having bad diarrhea since then. Patient reports 5-6 diarrheal episodes today, has stage IV lung and liver cancer. Reports feeling weak and lightheaded.   Past Medical History  Diagnosis Date  . CHF (congestive heart failure)   . Diabetes mellitus without complication   . Hypertension   . Presence of permanent cardiac pacemaker   . Dysrhythmia     afib  . Depression   . GERD (gastroesophageal reflux disease)   . Coronary artery disease   . Cancer     breast,uterine  . COPD (chronic obstructive pulmonary disease)   . Arthritis   . Anemia   . Anginal pain   . Cancer of upper lobe of right lung 11/03/2014  . Breast cancer     Patient Active Problem List   Diagnosis Date Noted  . Cancer of upper lobe of right lung ([place]) 11/03/2014  . History of biliary T-tube placement 10/29/2014  . Mass of lung   . Breath shortness 01/02/2014  . Artificial cardiac pacemaker 01/01/2014  . Diabetes ([place]) 01/01/2014  . H/O cardiac catheterization 01/01/2014  . History of cardioversion 01/01/2014  . S/P coronary artery balloon dilation 01/01/2014    Past Surgical History  Procedure Laterality Date  . Cholecystectomy    . Coronary angioplasty    . Coronary artery bypass graft    . Appendectomy    . Mastectomy    . Ablation of dysrhythmic focus    . Cardioversion    . Joint replacement      tkr  . Insert / replace / remove pacemaker    . Peripheral vascular catheterization N/A 11/05/2014    Procedure:  Glori Luis Cath Insertion;  Surgeon: Algernon Huxley, MD;  Location: [facility];  Service: Cardiovascular;  Laterality: N/A;  . Endobronchial ultrasound N/A 10/24/2014    Procedure: ENDOBRONCHIAL ULTRASOUND;  Surgeon: Vilinda Boehringer, MD;  Location: [facility] ORS;  Service: Cardiopulmonary;  Laterality: N/A;    Allergies Amoxicillin-pot clavulanate; Fentanyl; Codeine; Latex; and Other  Social History Social History  Substance Use Topics  . Smoking status: Former Smoker    Quit date: 10/21/1997  . Smokeless tobacco: Never Used  . Alcohol Use: No    Review of Systems Constitutional: Negative for fever. Eyes: Negative for visual changes. ENT: Negative for sore throat. Cardiovascular: Negative for chest pain. Respiratory: Negative for shortness of breath. Gastrointestinal: Negative for abdominal pain, positive for diarrhea negative for vomiting Genitourinary: Negative for dysuria. Musculoskeletal: Negative for back pain. Skin: Negative for rash. Neurological: Negative for headaches, positive for weakness  10-point ROS otherwise negative.  ____________________________________________   PHYSICAL EXAM:  VITAL SIGNS: ED Triage Vitals  Enc Vitals Group     BP 02/10/15 1352 123/59 mmHg     Pulse Rate 02/10/15 1352 72     Resp 02/10/15 1352 20     Temp 02/10/15 1352 98.7 F (37.1 C)     Temp Source 02/10/15 1352 Oral     SpO2 02/10/15 1352 100 %     Weight 02/10/15 1352 163 lb (73.936 kg)  Height 02/10/15 1352 '5\' 3"'$  (1.6 m)     Head Cir --      Peak Flow --      Pain Score 02/10/15 1352 5     Pain Loc --      Pain Edu? --      Excl. in [place]? --     Constitutional: Alert and oriented. Well appearing and in no distress. Eyes: Conjunctivae are pale. PERRL. Normal extraocular movements. ENT   Head: Normocephalic and atraumatic.   Nose: No congestion/rhinnorhea.   Mouth/Throat: Mucous membranes are moist.   Neck: No stridor. Cardiovascular: Normal rate, regular  rhythm. Normal and symmetric distal pulses are present in all extremities. No murmurs, rubs, or gallops. Respiratory: Normal respiratory effort without tachypnea nor retractions. Breath sounds are clear and equal bilaterally. No wheezes/rales/rhonchi. Gastrointestinal: Soft and nontender. No distention. No abdominal bruits.  Musculoskeletal: Nontender with normal range of motion in all extremities. No joint effusions.  No lower extremity tenderness nor edema. Neurologic:  Normal speech and language. No gross focal neurologic deficits are appreciated. Speech is normal. No gait instability. Skin:  Skin is warm, dry and intact. No rash noted. Psychiatric: Mood and affect are normal. Speech and behavior are normal. Patient exhibits appropriate insight and judgment. ____________________________________________  ED COURSE:  Pertinent labs & imaging results that were available during my care of the patient were reviewed by me and considered in my medical decision making (see chart for details). Patient's no acute distress, will check labs and give IV fluid bolus. ____________________________________________    LABS (pertinent positives/negatives)  Labs Reviewed  COMPREHENSIVE METABOLIC PANEL - Abnormal; Notable for the following:    Glucose, Bld 147 (*)    Calcium 8.6 (*)    Total Protein 6.2 (*)    Alkaline Phosphatase 167 (*)    Anion gap 4 (*)    All other components within normal limits  CBC - Abnormal; Notable for the following:    WBC 3.0 (*)    RBC 3.07 (*)    Hemoglobin 8.9 (*)    HCT 27.8 (*)    MCHC 31.9 (*)    RDW 28.2 (*)    Platelets 89 (*)    All other components within normal limits  URINALYSIS COMPLETEWITH MICROSCOPIC ([facility] ONLY) - Abnormal; Notable for the following:    Color, Urine YELLOW (*)    APPearance CLEAR (*)    Ketones, ur TRACE (*)    Bacteria, UA RARE (*)    Squamous Epithelial / LPF 0-5 (*)    All other components within normal limits  LIPASE, BLOOD      ____________________________________________  FINAL ASSESSMENT AND PLAN  Weakness, diarrhea, thrombocytopenia  Plan: Patient with labs and imaging as dictated above. Symptoms seem chemotherapy-induced diarrhea related. She is in no acute distress, was given 2 L IV fluid bolus as well as Zofran. She is stable for outpatient follow-up tomorrow scheduled.   Earleen Newport, MD   Earleen Newport, MD 02/10/15 2136

## 2015-02-10 NOTE — ED Notes (Signed)
Patient resting quietly, eating crackers.  Patient voices no complaints at this time.  IV fluids infusing without difficulty.

## 2015-02-10 NOTE — ED Notes (Signed)
Pt states she has been having severe diarrhea since Monday. Has been taking lomotil without relief.

## 2015-02-10 NOTE — Discharge Instructions (Signed)

## 2015-02-10 NOTE — ED Notes (Addendum)
Pt reports having constant diarrhea since Monday after chemo treatment. Pt reports diarrhea 5-6 times a day. Pt has stage 4 lung and liver CA. Pt reports feeling weak and light headed.

## 2015-02-11 ENCOUNTER — Inpatient Hospital Stay: Payer: Medicare Other

## 2015-02-11 ENCOUNTER — Telehealth: Payer: Self-pay | Admitting: *Deleted

## 2015-02-11 NOTE — Telephone Encounter (Signed)
Had to get IVF last night because of all the diarrhea she has had. She refuses to take any more of that chemo. She has lab appt today, but same labs were drawn yesterday. Does she need to come for lab draw today?

## 2015-02-11 NOTE — Telephone Encounter (Signed)
No need for labs today, will come in tomorrow morning to see md per Dr Oliva Bustard request. She has agreed to come in at [facility]

## 2015-02-12 ENCOUNTER — Inpatient Hospital Stay: Payer: Medicare Other

## 2015-02-12 ENCOUNTER — Inpatient Hospital Stay: Payer: Medicare Other | Attending: Oncology | Admitting: Oncology

## 2015-02-12 ENCOUNTER — Encounter: Payer: Self-pay | Admitting: Oncology

## 2015-02-12 VITALS — BP 122/77 | HR 89 | Temp 97.4°F | Wt 170.2 lb

## 2015-02-12 DIAGNOSIS — C3411 Malignant neoplasm of upper lobe, right bronchus or lung: Secondary | ICD-10-CM | POA: Diagnosis not present

## 2015-02-12 DIAGNOSIS — I4891 Unspecified atrial fibrillation: Secondary | ICD-10-CM | POA: Diagnosis not present

## 2015-02-12 DIAGNOSIS — Z5111 Encounter for antineoplastic chemotherapy: Secondary | ICD-10-CM | POA: Insufficient documentation

## 2015-02-12 DIAGNOSIS — Z87891 Personal history of nicotine dependence: Secondary | ICD-10-CM | POA: Diagnosis not present

## 2015-02-12 DIAGNOSIS — R5383 Other fatigue: Secondary | ICD-10-CM | POA: Diagnosis not present

## 2015-02-12 DIAGNOSIS — R197 Diarrhea, unspecified: Secondary | ICD-10-CM | POA: Diagnosis not present

## 2015-02-12 DIAGNOSIS — K219 Gastro-esophageal reflux disease without esophagitis: Secondary | ICD-10-CM

## 2015-02-12 DIAGNOSIS — Z950 Presence of cardiac pacemaker: Secondary | ICD-10-CM | POA: Diagnosis not present

## 2015-02-12 DIAGNOSIS — Z794 Long term (current) use of insulin: Secondary | ICD-10-CM | POA: Diagnosis not present

## 2015-02-12 DIAGNOSIS — E860 Dehydration: Secondary | ICD-10-CM

## 2015-02-12 DIAGNOSIS — M545 Low back pain: Secondary | ICD-10-CM

## 2015-02-12 DIAGNOSIS — R531 Weakness: Secondary | ICD-10-CM

## 2015-02-12 DIAGNOSIS — D649 Anemia, unspecified: Secondary | ICD-10-CM | POA: Diagnosis not present

## 2015-02-12 DIAGNOSIS — Z853 Personal history of malignant neoplasm of breast: Secondary | ICD-10-CM | POA: Diagnosis not present

## 2015-02-12 DIAGNOSIS — C787 Secondary malignant neoplasm of liver and intrahepatic bile duct: Secondary | ICD-10-CM | POA: Diagnosis not present

## 2015-02-12 DIAGNOSIS — R0602 Shortness of breath: Secondary | ICD-10-CM | POA: Diagnosis not present

## 2015-02-12 DIAGNOSIS — T451X5A Adverse effect of antineoplastic and immunosuppressive drugs, initial encounter: Secondary | ICD-10-CM

## 2015-02-12 DIAGNOSIS — Z86010 Personal history of colonic polyps: Secondary | ICD-10-CM

## 2015-02-12 DIAGNOSIS — Z79899 Other long term (current) drug therapy: Secondary | ICD-10-CM | POA: Diagnosis not present

## 2015-02-12 DIAGNOSIS — E876 Hypokalemia: Secondary | ICD-10-CM | POA: Diagnosis not present

## 2015-02-12 DIAGNOSIS — G8929 Other chronic pain: Secondary | ICD-10-CM

## 2015-02-12 DIAGNOSIS — Z7982 Long term (current) use of aspirin: Secondary | ICD-10-CM

## 2015-02-12 DIAGNOSIS — I509 Heart failure, unspecified: Secondary | ICD-10-CM

## 2015-02-12 DIAGNOSIS — Z23 Encounter for immunization: Secondary | ICD-10-CM | POA: Diagnosis not present

## 2015-02-12 DIAGNOSIS — M129 Arthropathy, unspecified: Secondary | ICD-10-CM | POA: Insufficient documentation

## 2015-02-12 DIAGNOSIS — F329 Major depressive disorder, single episode, unspecified: Secondary | ICD-10-CM | POA: Diagnosis not present

## 2015-02-12 DIAGNOSIS — I2510 Atherosclerotic heart disease of native coronary artery without angina pectoris: Secondary | ICD-10-CM | POA: Diagnosis not present

## 2015-02-12 DIAGNOSIS — I10 Essential (primary) hypertension: Secondary | ICD-10-CM | POA: Diagnosis not present

## 2015-02-12 DIAGNOSIS — J90 Pleural effusion, not elsewhere classified: Secondary | ICD-10-CM | POA: Insufficient documentation

## 2015-02-12 DIAGNOSIS — M1990 Unspecified osteoarthritis, unspecified site: Secondary | ICD-10-CM

## 2015-02-12 DIAGNOSIS — J811 Chronic pulmonary edema: Secondary | ICD-10-CM | POA: Insufficient documentation

## 2015-02-12 DIAGNOSIS — J449 Chronic obstructive pulmonary disease, unspecified: Secondary | ICD-10-CM | POA: Insufficient documentation

## 2015-02-12 DIAGNOSIS — E119 Type 2 diabetes mellitus without complications: Secondary | ICD-10-CM

## 2015-02-12 DIAGNOSIS — E878 Other disorders of electrolyte and fluid balance, not elsewhere classified: Secondary | ICD-10-CM

## 2015-02-12 DIAGNOSIS — I209 Angina pectoris, unspecified: Secondary | ICD-10-CM | POA: Insufficient documentation

## 2015-02-12 MED ORDER — SODIUM CHLORIDE 0.9 % IV SOLN
Freq: Once | INTRAVENOUS | Status: AC
  Administered 2015-02-12: 12:00:00 via INTRAVENOUS
  Filled 2015-02-12: qty 1000

## 2015-02-12 MED ORDER — LOPERAMIDE HCL 2 MG PO TABS
4.0000 mg | ORAL_TABLET | Freq: Once | ORAL | Status: AC
  Administered 2015-02-12: 4 mg via ORAL
  Filled 2015-02-12: qty 2

## 2015-02-12 MED ORDER — OCTREOTIDE ACETATE 100 MCG/ML IJ SOLN
200.0000 ug | Freq: Once | INTRAMUSCULAR | Status: AC
  Administered 2015-02-12: 200 ug via SUBCUTANEOUS
  Filled 2015-02-12: qty 2

## 2015-02-12 MED ORDER — SODIUM CHLORIDE 0.9 % IJ SOLN
10.0000 mL | INTRAMUSCULAR | Status: DC | PRN
  Administered 2015-02-12: 10 mL
  Filled 2015-02-12: qty 10

## 2015-02-12 MED ORDER — METRONIDAZOLE 500 MG PO TABS: 500.0000 mg | ORAL_TABLET | Freq: Three times a day (TID) | ORAL | Status: DC

## 2015-02-12 MED ORDER — HEPARIN SOD (PORK) LOCK FLUSH 100 UNIT/ML IV SOLN
500.0000 [IU] | Freq: Once | INTRAVENOUS | Status: AC
  Administered 2015-02-12: 500 [IU] via INTRAVENOUS
  Filled 2015-02-12: qty 5

## 2015-02-12 MED ORDER — BISMUTH SUBSALICYLATE 262 MG/15ML PO SUSP: 30.0000 mL | Freq: Four times a day (QID) | ORAL | Status: DC

## 2015-02-12 NOTE — Progress Notes (Signed)
Patient does have living will.  Former smoker.  Patient continues to have diarrhea.  Patient has been to [facility] 3 times this morning.  She has taken all of the Lomotil that was prescribed on Friday except for 2 pills.  Placed yellow bracelet on patient.  Educated her to wear it to every appointment even if it is a lab only.

## 2015-02-13 ENCOUNTER — Inpatient Hospital Stay: Payer: Medicare Other

## 2015-02-13 ENCOUNTER — Inpatient Hospital Stay (HOSPITAL_BASED_OUTPATIENT_CLINIC_OR_DEPARTMENT_OTHER): Payer: Medicare Other | Admitting: Oncology

## 2015-02-13 VITALS — BP 146/80 | HR 79 | Temp 97.3°F | Wt 169.3 lb

## 2015-02-13 DIAGNOSIS — E878 Other disorders of electrolyte and fluid balance, not elsewhere classified: Secondary | ICD-10-CM

## 2015-02-13 DIAGNOSIS — E119 Type 2 diabetes mellitus without complications: Secondary | ICD-10-CM

## 2015-02-13 DIAGNOSIS — G8929 Other chronic pain: Secondary | ICD-10-CM

## 2015-02-13 DIAGNOSIS — C787 Secondary malignant neoplasm of liver and intrahepatic bile duct: Secondary | ICD-10-CM | POA: Diagnosis not present

## 2015-02-13 DIAGNOSIS — I4891 Unspecified atrial fibrillation: Secondary | ICD-10-CM

## 2015-02-13 DIAGNOSIS — Z794 Long term (current) use of insulin: Secondary | ICD-10-CM

## 2015-02-13 DIAGNOSIS — Z23 Encounter for immunization: Secondary | ICD-10-CM

## 2015-02-13 DIAGNOSIS — K219 Gastro-esophageal reflux disease without esophagitis: Secondary | ICD-10-CM

## 2015-02-13 DIAGNOSIS — Z5189 Encounter for other specified aftercare: Secondary | ICD-10-CM

## 2015-02-13 DIAGNOSIS — Z950 Presence of cardiac pacemaker: Secondary | ICD-10-CM

## 2015-02-13 DIAGNOSIS — M129 Arthropathy, unspecified: Secondary | ICD-10-CM

## 2015-02-13 DIAGNOSIS — R197 Diarrhea, unspecified: Secondary | ICD-10-CM | POA: Diagnosis not present

## 2015-02-13 DIAGNOSIS — E860 Dehydration: Secondary | ICD-10-CM

## 2015-02-13 DIAGNOSIS — I2510 Atherosclerotic heart disease of native coronary artery without angina pectoris: Secondary | ICD-10-CM

## 2015-02-13 DIAGNOSIS — Z86010 Personal history of colonic polyps: Secondary | ICD-10-CM

## 2015-02-13 DIAGNOSIS — Z853 Personal history of malignant neoplasm of breast: Secondary | ICD-10-CM

## 2015-02-13 DIAGNOSIS — Z87891 Personal history of nicotine dependence: Secondary | ICD-10-CM

## 2015-02-13 DIAGNOSIS — I10 Essential (primary) hypertension: Secondary | ICD-10-CM

## 2015-02-13 DIAGNOSIS — Z79899 Other long term (current) drug therapy: Secondary | ICD-10-CM

## 2015-02-13 DIAGNOSIS — C3411 Malignant neoplasm of upper lobe, right bronchus or lung: Secondary | ICD-10-CM | POA: Diagnosis not present

## 2015-02-13 DIAGNOSIS — M1990 Unspecified osteoarthritis, unspecified site: Secondary | ICD-10-CM

## 2015-02-13 DIAGNOSIS — J90 Pleural effusion, not elsewhere classified: Secondary | ICD-10-CM

## 2015-02-13 DIAGNOSIS — I209 Angina pectoris, unspecified: Secondary | ICD-10-CM

## 2015-02-13 DIAGNOSIS — D649 Anemia, unspecified: Secondary | ICD-10-CM

## 2015-02-13 DIAGNOSIS — J811 Chronic pulmonary edema: Secondary | ICD-10-CM

## 2015-02-13 DIAGNOSIS — F329 Major depressive disorder, single episode, unspecified: Secondary | ICD-10-CM

## 2015-02-13 DIAGNOSIS — I509 Heart failure, unspecified: Secondary | ICD-10-CM

## 2015-02-13 DIAGNOSIS — J449 Chronic obstructive pulmonary disease, unspecified: Secondary | ICD-10-CM

## 2015-02-13 DIAGNOSIS — R5383 Other fatigue: Secondary | ICD-10-CM

## 2015-02-13 DIAGNOSIS — M545 Low back pain: Secondary | ICD-10-CM

## 2015-02-13 DIAGNOSIS — R531 Weakness: Secondary | ICD-10-CM

## 2015-02-13 DIAGNOSIS — R0602 Shortness of breath: Secondary | ICD-10-CM

## 2015-02-13 DIAGNOSIS — Z7982 Long term (current) use of aspirin: Secondary | ICD-10-CM

## 2015-02-13 LAB — BASIC METABOLIC PANEL
Anion gap: 4 — ABNORMAL LOW (ref 5–15)
BUN: 11 mg/dL (ref 6–20)
CHLORIDE: 111 mmol/L (ref 101–111)
CO2: 21 mmol/L — ABNORMAL LOW (ref 22–32)
Calcium: 7.8 mg/dL — ABNORMAL LOW (ref 8.9–10.3)
Creatinine, Ser: 0.77 mg/dL (ref 0.44–1.00)
GFR calc non Af Amer: >60 mL/min (ref >60)
Glucose, Bld: 150 mg/dL — ABNORMAL HIGH (ref 65–99)
POTASSIUM: 3.1 mmol/L — AB (ref 3.5–5.1)
SODIUM: 136 mmol/L (ref 135–145)

## 2015-02-13 LAB — CBC WITH DIFFERENTIAL/PLATELET
BASOS ABS: 0 10*3/uL (ref 0–0.1)
BASOS PCT: 0 %
Eosinophils Absolute: 0.1 10*3/uL (ref 0–0.7)
Eosinophils Relative: 7 %
HEMATOCRIT: 27.8 % — AB (ref 35.0–47.0)
HEMOGLOBIN: 9 g/dL — AB (ref 12.0–16.0)
Lymphocytes Relative: 39 %
Lymphs Abs: 0.8 10*3/uL — ABNORMAL LOW (ref 1.0–3.6)
MCH: 29.5 pg (ref 26.0–34.0)
MCHC: 32.5 g/dL (ref 32.0–36.0)
MCV: 90.6 fL (ref 80.0–100.0)
MONOS PCT: 7 %
Monocytes Absolute: 0.1 10*3/uL — ABNORMAL LOW (ref 0.2–0.9)
NEUTROS ABS: 0.9 10*3/uL — AB (ref 1.4–6.5)
NEUTROS PCT: 47 %
Platelets: 60 10*3/uL — ABNORMAL LOW (ref 150–440)
RBC: 3.07 MIL/uL — ABNORMAL LOW (ref 3.80–5.20)
RDW: 27 % — ABNORMAL HIGH (ref 11.5–14.5)
WBC: 2 10*3/uL — ABNORMAL LOW (ref 3.6–11.0)

## 2015-02-13 LAB — MAGNESIUM: Magnesium: 1.6 mg/dL — ABNORMAL LOW (ref 1.7–2.4)

## 2015-02-13 MED ORDER — SODIUM CHLORIDE 0.9 % IV SOLN
Freq: Once | INTRAVENOUS | Status: AC
  Administered 2015-02-13: 12:00:00 via INTRAVENOUS
  Filled 2015-02-13: qty 1000

## 2015-02-13 MED ORDER — SODIUM CHLORIDE 0.9 % IV SOLN
Freq: Once | INTRAVENOUS | Status: DC
  Administered 2015-02-13: 13:00:00 via INTRAVENOUS
  Filled 2015-02-13: qty 1000

## 2015-02-13 MED ORDER — HEPARIN SOD (PORK) LOCK FLUSH 100 UNIT/ML IV SOLN
INTRAVENOUS | Status: AC
  Filled 2015-02-13: qty 5

## 2015-02-13 MED ORDER — OCTREOTIDE ACETATE 20 MG IM KIT
20.0000 mg | PACK | Freq: Once | INTRAMUSCULAR | Status: AC
  Administered 2015-02-13: 20 mg via INTRAMUSCULAR
  Filled 2015-02-13: qty 1

## 2015-02-13 NOTE — Progress Notes (Signed)
[place] @ [facility] [phone]  [phone]     Wendy Giles OB: [birth year]  MR#: [id number]  KKX#:[id number]  Patient Care Team: Tracie Harrier, MD as PCP - General (Internal Medicine) Isaias Cowman, MD as Consulting Physician (Cardiology)  CHIEF COMPLAINT:  Chief Complaint  Patient presents with  . Diarrhea       Carcinoma of breast                                AJCC Staging:  [id number]                               Stage Grouping: I                               Cancer Status: Evidence of disease.                               Estrogen receptor positive and                               progesterone receptor positive. HER- 2                               Negative. 2.  Abnormal CT scan of the chest (June, 2016) Oncology History   1.  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  Previous history of carcinoma breast stage I disease 3.  Started on chemotherapy with carboplatinum and VP-16 from November 20, 2014 MRI scan of brain was negative (July, 2016)   Patient was started on carboplatinum and VP-16 which has been discontinued in September of 2016 (received total of 3 cycles of chemotherapy) CT scan did not see any significant response 4, patient will be started on carboplatinum and CPT-11 from January 28, 2015   Oncology Flowsheet 12/31/2014 01/01/2015 01/02/2015 01/28/2015 02/04/2015 02/10/2015 02/12/2015  Day, Cycle Day 1, Cycle 3 Day 2, Cycle 3 Day 3, Cycle 3 Day 1, Cycle 1 Day 8, Cycle 1 - -  CARBOplatin (PARAPLATIN) IV 360 mg - - 350 mg - - -  dexamethasone (DECADRON) IV [ 12 mg ] [ 10 mg ] [ 10 mg ] [ 12 mg ] [ 10 mg ] - -  etoposide (VEPESID) IV 80 mg/m2 80 mg/m2 80 mg/m2 - - - -  fosaprepitant (EMEND) IV [ 150 mg ] - - [ 150 mg ] - - -  irinotecan (CAMPTOSAR) IV - - - 65 mg/m2 65 mg/m2 - -    octreotide (SANDOSTATIN) 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  Started having diarrhea 2 days ago several stool.  No nausea no vomiting.  Diarrhea is watery.  No blood.  No mucus.  No chills.  No fever.  Feeling week tired and exhausted. Condition did not respond to Imodium later on Lomotil was tried but patient continued to have diarrhea. REVIEW OF SYSTEMS:    general status: Patient is feeling weak and tired.  No change in a performance status.  No chills.  No fever. HEENT: Alopecia.  No evidence of stomatitis Chest membranes are dry Lungs: No cough or shortness of breath Cardiac: No chest pain or paroxysmal nocturnal dyspnea GI: No nausea no vomiting no diarrhea no abdominal pain Skin: Poor skin turgor Lower extremity no swelling Neurological system: No tingling.  No numbness.  No other focal signs Musculoskeletal system no bony pains  Genitourinary system: No dysuria.  No hematuria.  No frequency.  No flank pain. Neurological system: No dizziness.  No tingling.  His was.  no tingling numbness.  no focal weakness or any focal signs. Depression has improved ve.  PAST MEDICAL HISTORY: Past Medical History  Diagnosis Date  . CHF (congestive heart failure) ([place])   . Diabetes mellitus without complication ([place])   . Hypertension   . Presence of permanent cardiac pacemaker   . Dysrhythmia     afib  . Depression   . GERD (gastroesophageal reflux disease)   . Coronary artery disease   . Cancer ([place])     breast,uterine  . COPD (chronic obstructive pulmonary disease) ([place])   . Arthritis   . Anemia   . Anginal pain ([place])   . Cancer of upper lobe of right lung ([place]) 11/03/2014  . Breast cancer [facility])        Surgical History            Diabetes controlled with oral medication                               Previous history of congestive heart                               failure                               Coronary artery disease with stent                               placement and bypass surgery Hypertension                               Osteoarthritis                               Colon polyps                               Chronic low back pain  Past Surgical History:                               Coronary bypass surgery and stent                               placement                                Pacemaker because of atrial fibrillation                               Appendectomy                               Hemorrhoidectomy                                                              Family History:                               Family history of breast cancer and colon                               cancer. History of anemia, diabetes, heart                               disease, and hypertension in the family.                                                              Social History:                               Does not smoke now. Does not drink. Used                               to smoke in the pa ADVANCED DIRECTIVES:  No flowsheet data found.  HEALTH MAINTENANCE: Social History  Substance Use Topics  . Smoking status: Former Smoker    Quit date: 10/21/1997  . Smokeless tobacco: Never Used  . Alcohol Use: No      Allergies  Allergen Reactions  . Amoxicillin-Pot Clavulanate Hives and Diarrhea  . Fentanyl Itching  . Codeine Nausea And Vomiting and Other (See Comments)    Other Reaction: "very ill"  . Latex Rash  . Other Rash    Current Outpatient Prescriptions  Medication Sig Dispense Refill  . albuterol (PROAIR HFA) 108 (90 BASE) MCG/ACT inhaler Inhale into the lungs.    . ALPRAZolam  (XANAX) 0.5 MG tablet Take 0.5 mg by mouth once  a week.     Marland Kitchen aspirin 81 MG tablet Take 81 mg by mouth daily.     . cyclobenzaprine (FLEXERIL) 5 MG tablet Take 5 mg by mouth daily as needed for muscle spasms.     . dabigatran (PRADAXA) 150 MG CAPS capsule TAKE ONE CAPSULE BY MOUTH TWICE DAILY    . dicyclomine (BENTYL) 10 MG capsule Take 10 mg by mouth 3 (three) times daily as needed (abdominal pain).     Marland Kitchen diphenoxylate-atropine (LOMOTIL) 2.5-0.025 MG tablet Take 1 tablet by mouth 4 (four) times daily as needed for diarrhea or loose stools. 30 tablet 1  . fentaNYL (DURAGESIC - DOSED MCG/HR) 12 MCG/HR Place 1 patch (12.5 mcg total) onto the skin every 3 (three) days. 10 patch 0  . fluconazole (DIFLUCAN) 100 MG tablet Take 1 tablet (100 mg total) by mouth daily. 5 tablet 0  . furosemide (LASIX) 40 MG tablet Take 40 mg by mouth daily.     Marland Kitchen gabapentin (NEURONTIN) 100 MG capsule Take 100 mg by mouth every morning.     Marland Kitchen glipiZIDE (GLUCOTROL) 10 MG tablet Take 10 mg by mouth 2 (two) times daily before a meal.   0  . insulin lispro (HUMALOG) 100 UNIT/ML KiwkPen as per sliding scale< than 150-- No insulin150-200---4 units 201-250---6 units251-300--- 8 units301-350----10 units.351-400---12 units    . isosorbide mononitrate (IMDUR) 60 MG 24 hr tablet Take 60 mg by mouth daily.     Marland Kitchen levothyroxine (SYNTHROID, LEVOTHROID) 25 MCG tablet Take 25 mcg by mouth daily before breakfast.     . lidocaine-prilocaine (EMLA) cream Apply 1 application topically as needed. 30 g 3  . Liraglutide 18 MG/3ML SOPN Inject 3 mLs into the skin daily.     Marland Kitchen losartan (COZAAR) 100 MG tablet Take 100 mg by mouth daily.     . magic mouthwash w/lidocaine SOLN Take 5 mLs by mouth 4 (four) times daily as needed for mouth pain. 480 mL 3  . Magnesium-Calcium-Folic Acid 381-017-5 MG TABS Take 1 tablet by mouth daily.     . metFORMIN (GLUCOPHAGE) 1000 MG tablet Take 1,000 mg by mouth 2 (two) times daily.     . metoprolol-hydrochlorothiazide  (LOPRESSOR HCT) 50-25 MG per tablet Take 1 tablet by mouth daily.     . nitroGLYCERIN (NITROSTAT) 0.4 MG SL tablet Place 0.4 mg under the tongue every 5 (five) minutes as needed for chest pain.     Marland Kitchen ondansetron (ZOFRAN) 8 MG tablet Take 1 tablet (8 mg total) by mouth 2 (two) times daily. Start the day after chemo for 3 days. Then take as needed for nausea or vomiting. 30 tablet 2  . oxyCODONE (OXY IR/ROXICODONE) 5 MG immediate release tablet Take 1 tablet (5 mg total) by mouth every 4 (four) hours as needed for severe pain. 30 tablet 0  . potassium chloride (K-DUR) 10 MEQ tablet Take 10 mEq by mouth 2 (two) times daily.     . promethazine (PHENERGAN) 25 MG tablet TAKE ONE TABLET BY MOUTH EVERY SIX HOURSAS NEEDED FOR NAUSEA OR VOMITING 30 tablet 0  . RABEprazole (ACIPHEX) 20 MG tablet Take 20 mg by mouth daily.     . simvastatin (ZOCOR) 20 MG tablet Take 20 mg by mouth every evening.     . bismuth subsalicylate (KAOPECTATE) 262 MG/15ML suspension Take 30 mLs by mouth 4 (four) times daily. 360 mL 0  . metroNIDAZOLE (FLAGYL) 500 MG tablet Take 1 tablet (500 mg total) by mouth 3 (three) times  daily. 21 tablet 0   No current facility-administered medications for this visit.   Facility-Administered Medications Ordered in Other Visits  Medication Dose Route Frequency Provider Last Rate Last Dose  . heparin lock flush 100 unit/mL  500 Units Intravenous Once Forest Gleason, MD   500 Units at 11/20/14 1116  . sodium chloride 0.9 % injection 10 mL  10 mL Intravenous PRN Forest Gleason, MD   10 mL at 11/20/14 0916  . sodium chloride 0.9 % injection 10 mL  10 mL Intravenous PRN Forest Gleason, MD   10 mL at 01/21/15 0915    OBJECTIVE:  Filed Vitals:   02/12/15 1018  BP: 122/77  Pulse: 89  Temp: 97.4 F (36.3 C)     Body mass index is 30.16 kg/(m^2).    ECOG FS:1 - Symptomatic but completely ambulatory  PHYSICAL EXAM: General  status: Performance status is good.  Patient said clinical signs of  dehydration  Skin turgor is poor.  Mucous membranes are dry. HEENT: No evidence of stomatitis. Sclera and conjunctivae :: No jaundice.   pale looking. Lungs: Air  entry equal on both sides.  No rhonchi.  No rales.  Cardiac: Heart sounds are normal.  No pericardial rub.  No murmur. There is some swelling above port.  Soft.  No fluid.  No redness or tenderness. Lymphatic system: Cervical, axillary, inguinal, lymph nodes not palpable GI: Abdomen is soft.  No ascites.  Liver liver is palpable.  Tenderness in left upper quadrant   No tenderness.  Bowel sounds are within normal limit Lower extremity: No edema Neurological system: Higher functions, cranial nerves intact no evidence of peripheral neuropathy.  Examination of both breasts.  Patient had down previous history of carcinoma of left breast no palpable masses axillary lymph nodes are normal.  Right breast free of masses.    LAB RESULTS:   Lab has been reviewed.    ASSESSMENT:  Acute problems secondary to diarrhea  Patient has mild dehydration Diarrhea is secondary to chemotherapy 1.  Right upper lobe carcinoma of lung.  Biopsy is positive for small cell undifferentiated tumor CT scan of the abdomen shows multiple liver metastases Stage IV disease ea.  MEDICAL DECISION MAKING:  IV fluid.  Electrolyte imbalance.  Will be corrected.   Sandostatin 200 mg subcutaneous on a trial basis consider Sandostatin LAR  Kaopectate  Stool for C. difficile  Flagyl 500 mg every 8 hour until stool result is available.  . Patient would be seen on a regular basis daily to maintain hydration  No matching staging information was found for the patient.  Forest Gleason, MD   02/13/2015 9:30 AM

## 2015-02-13 NOTE — Progress Notes (Signed)
Patient does not have living will.  Former smoker.  Patient continues to have diarrhea.

## 2015-02-14 ENCOUNTER — Ambulatory Visit
Admission: RE | Admit: 2015-02-14 | Discharge: 2015-02-14 | Disposition: A | Discharge status: Home / Self Care | Payer: Medicare Other | Source: Ambulatory Visit | Attending: Family Medicine | Admitting: Family Medicine

## 2015-02-14 ENCOUNTER — Inpatient Hospital Stay: Payer: Medicare Other

## 2015-02-14 ENCOUNTER — Other Ambulatory Visit: Payer: Self-pay | Admitting: *Deleted

## 2015-02-14 ENCOUNTER — Other Ambulatory Visit: Payer: Self-pay | Admitting: Family Medicine

## 2015-02-14 ENCOUNTER — Inpatient Hospital Stay (HOSPITAL_BASED_OUTPATIENT_CLINIC_OR_DEPARTMENT_OTHER): Payer: Medicare Other | Admitting: Family Medicine

## 2015-02-14 VITALS — BP 152/82 | HR 71 | Temp 96.9°F | Resp 18

## 2015-02-14 DIAGNOSIS — M545 Low back pain: Secondary | ICD-10-CM

## 2015-02-14 DIAGNOSIS — R531 Weakness: Secondary | ICD-10-CM

## 2015-02-14 DIAGNOSIS — R918 Other nonspecific abnormal finding of lung field: Secondary | ICD-10-CM | POA: Diagnosis not present

## 2015-02-14 DIAGNOSIS — R197 Diarrhea, unspecified: Secondary | ICD-10-CM | POA: Diagnosis not present

## 2015-02-14 DIAGNOSIS — C3411 Malignant neoplasm of upper lobe, right bronchus or lung: Secondary | ICD-10-CM

## 2015-02-14 DIAGNOSIS — J90 Pleural effusion, not elsewhere classified: Secondary | ICD-10-CM | POA: Diagnosis not present

## 2015-02-14 DIAGNOSIS — E878 Other disorders of electrolyte and fluid balance, not elsewhere classified: Secondary | ICD-10-CM | POA: Diagnosis not present

## 2015-02-14 DIAGNOSIS — G8929 Other chronic pain: Secondary | ICD-10-CM

## 2015-02-14 DIAGNOSIS — R0602 Shortness of breath: Secondary | ICD-10-CM

## 2015-02-14 DIAGNOSIS — J9811 Atelectasis: Secondary | ICD-10-CM | POA: Insufficient documentation

## 2015-02-14 DIAGNOSIS — E860 Dehydration: Secondary | ICD-10-CM

## 2015-02-14 DIAGNOSIS — Z953 Presence of xenogenic heart valve: Secondary | ICD-10-CM

## 2015-02-14 DIAGNOSIS — I517 Cardiomegaly: Secondary | ICD-10-CM | POA: Diagnosis not present

## 2015-02-14 DIAGNOSIS — J984 Other disorders of lung: Secondary | ICD-10-CM | POA: Diagnosis not present

## 2015-02-14 DIAGNOSIS — Z86010 Personal history of colonic polyps: Secondary | ICD-10-CM

## 2015-02-14 DIAGNOSIS — Z79899 Other long term (current) drug therapy: Secondary | ICD-10-CM

## 2015-02-14 DIAGNOSIS — Z794 Long term (current) use of insulin: Secondary | ICD-10-CM

## 2015-02-14 DIAGNOSIS — F329 Major depressive disorder, single episode, unspecified: Secondary | ICD-10-CM

## 2015-02-14 DIAGNOSIS — I209 Angina pectoris, unspecified: Secondary | ICD-10-CM

## 2015-02-14 DIAGNOSIS — I2510 Atherosclerotic heart disease of native coronary artery without angina pectoris: Secondary | ICD-10-CM

## 2015-02-14 DIAGNOSIS — C787 Secondary malignant neoplasm of liver and intrahepatic bile duct: Secondary | ICD-10-CM

## 2015-02-14 DIAGNOSIS — E119 Type 2 diabetes mellitus without complications: Secondary | ICD-10-CM

## 2015-02-14 DIAGNOSIS — J449 Chronic obstructive pulmonary disease, unspecified: Secondary | ICD-10-CM

## 2015-02-14 DIAGNOSIS — K219 Gastro-esophageal reflux disease without esophagitis: Secondary | ICD-10-CM

## 2015-02-14 DIAGNOSIS — M129 Arthropathy, unspecified: Secondary | ICD-10-CM

## 2015-02-14 DIAGNOSIS — I10 Essential (primary) hypertension: Secondary | ICD-10-CM

## 2015-02-14 DIAGNOSIS — R5383 Other fatigue: Secondary | ICD-10-CM

## 2015-02-14 DIAGNOSIS — M1990 Unspecified osteoarthritis, unspecified site: Secondary | ICD-10-CM

## 2015-02-14 DIAGNOSIS — I509 Heart failure, unspecified: Secondary | ICD-10-CM

## 2015-02-14 DIAGNOSIS — I4891 Unspecified atrial fibrillation: Secondary | ICD-10-CM

## 2015-02-14 DIAGNOSIS — D649 Anemia, unspecified: Secondary | ICD-10-CM

## 2015-02-14 DIAGNOSIS — Z7982 Long term (current) use of aspirin: Secondary | ICD-10-CM

## 2015-02-14 DIAGNOSIS — J811 Chronic pulmonary edema: Secondary | ICD-10-CM

## 2015-02-14 LAB — CBC WITH DIFFERENTIAL/PLATELET
BASOS PCT: 0 %
Basophils Absolute: 0 10*3/uL (ref 0–0.1)
EOS ABS: 0 10*3/uL (ref 0–0.7)
Eosinophils Relative: 0 %
HCT: 29.6 % — ABNORMAL LOW (ref 35.0–47.0)
Hemoglobin: 9.5 g/dL — ABNORMAL LOW (ref 12.0–16.0)
Lymphocytes Relative: 18 %
Lymphs Abs: 0.3 10*3/uL — ABNORMAL LOW (ref 1.0–3.6)
MCH: 29.5 pg (ref 26.0–34.0)
MCHC: 32.1 g/dL (ref 32.0–36.0)
MCV: 91.9 fL (ref 80.0–100.0)
MONO ABS: 0.1 10*3/uL — AB (ref 0.2–0.9)
MONOS PCT: 7 %
NEUTROS PCT: 75 %
Neutro Abs: 1.3 10*3/uL — ABNORMAL LOW (ref 1.4–6.5)
PLATELETS: 63 10*3/uL — AB (ref 150–440)
RBC: 3.22 MIL/uL — ABNORMAL LOW (ref 3.80–5.20)
RDW: 26.8 % — AB (ref 11.5–14.5)
WBC: 1.8 10*3/uL — ABNORMAL LOW (ref 3.6–11.0)

## 2015-02-14 LAB — BASIC METABOLIC PANEL
Anion gap: 4 — ABNORMAL LOW (ref 5–15)
BUN: 11 mg/dL (ref 6–20)
CALCIUM: 7.7 mg/dL — AB (ref 8.9–10.3)
CO2: 20 mmol/L — ABNORMAL LOW (ref 22–32)
CREATININE: 0.68 mg/dL (ref 0.44–1.00)
Chloride: 110 mmol/L (ref 101–111)
GFR calc non Af Amer: >60 mL/min (ref >60)
Glucose, Bld: 210 mg/dL — ABNORMAL HIGH (ref 65–99)
Potassium: 3.3 mmol/L — ABNORMAL LOW (ref 3.5–5.1)
SODIUM: 134 mmol/L — AB (ref 135–145)

## 2015-02-14 LAB — BODY FLUID CELL COUNT WITH DIFFERENTIAL
EOS FL: 0 %
LYMPHS FL: 2 %
Monocyte-Macrophage-Serous Fluid: 95 %
NEUTROPHIL FLUID: 3 %
OTHER CELLS FL: 0 %
Total Nucleated Cell Count, Fluid: 478 cu mm

## 2015-02-14 LAB — MAGNESIUM: MAGNESIUM: 1.9 mg/dL (ref 1.7–2.4)

## 2015-02-14 LAB — C DIFFICILE QUICK SCREEN W PCR REFLEX
C DIFFICLE (CDIFF) ANTIGEN: NEGATIVE
C Diff interpretation: NEGATIVE
C Diff toxin: NEGATIVE

## 2015-02-14 MED ORDER — TORSEMIDE 20 MG PO TABS: 20.0000 mg | ORAL_TABLET | Freq: Every day | ORAL | Status: DC

## 2015-02-14 MED ORDER — POTASSIUM CHLORIDE ER 10 MEQ PO TBCR: 20.0000 meq | EXTENDED_RELEASE_TABLET | Freq: Every day | ORAL | Status: AC

## 2015-02-14 MED ORDER — IOHEXOL 350 MG/ML SOLN
75.0000 mL | Freq: Once | INTRAVENOUS | Status: AC | PRN
Start: 2015-02-14 — End: 2015-02-14
  Administered 2015-02-14: 75 mL via INTRAVENOUS

## 2015-02-14 NOTE — Progress Notes (Signed)
[place]  [phone]) [phone]  [phone]) [phone]     Wendy Giles DOB: Feb 18, 1933  MR#: [id number]  CNO#:[id number]  Patient Care Team: Tracie Harrier, MD as PCP - General (Internal Medicine) Isaias Cowman, MD as Consulting Physician (Cardiology)  CHIEF COMPLAINT:  Chief Complaint  Patient presents with  . Shortness of Breath  . Fatigue    INTERVAL HISTORY:  Patient is here as an acute add-on with severe complaints of shortness of breath. Patient has also had persistent diarrhea for the last several weeks. She was seen in clinic yesterday and received the Sandostatin LAR injection to aid in relief of diarrhea. Patient states that this morning she had to call EMS to deliver oxygen as she became extremely short of breath with exertion walking from bedroom to bathroom.  REVIEW OF SYSTEMS:   Review of Systems  Constitutional: Positive for malaise/fatigue. Negative for fever, chills, weight loss and diaphoresis.  HENT: Negative for congestion, ear discharge, ear pain, hearing loss, nosebleeds, sore throat and tinnitus.   Eyes: Negative for blurred vision, double vision, photophobia, pain, discharge and redness.  Respiratory: Positive for cough and shortness of breath. Negative for hemoptysis, sputum production, wheezing and stridor.   Cardiovascular: Negative for chest pain, palpitations, orthopnea, claudication, leg swelling and PND.  Gastrointestinal: Positive for diarrhea. Negative for heartburn, nausea, vomiting, abdominal pain, constipation, blood in stool and melena.  Genitourinary: Negative.   Musculoskeletal: Negative.   Skin: Negative.   Neurological: Positive for weakness. Negative for dizziness, tingling, focal weakness, seizures and headaches.  Endo/Heme/Allergies: Does not bruise/bleed easily.  Psychiatric/Behavioral: Negative for depression. The patient is not nervous/anxious and does not have insomnia.     As per HPI. Otherwise, a  complete review of systems is negatve.  ONCOLOGY HISTORY: Oncology History   1.  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  Previous history of carcinoma breast stage I disease 3.  Started on chemotherapy with carboplatinum and VP-16 from November 20, 2014 MRI scan of brain was negative (July, 2016)     Cancer of upper lobe of right lung ([place])   11/03/2014 Initial Diagnosis Cancer of upper lobe of right lung    PAST MEDICAL HISTORY: Past Medical History  Diagnosis Date  . CHF (congestive heart failure) ([place])   . Diabetes mellitus without complication ([place])   . Hypertension   . Presence of permanent cardiac pacemaker   . Dysrhythmia     afib  . Depression   . GERD (gastroesophageal reflux disease)   . Coronary artery disease   . Cancer ([place])     breast,uterine  . COPD (chronic obstructive pulmonary disease) ([place])   . Arthritis   . Anemia   . Anginal pain ([place])   . Cancer of upper lobe of right lung ([place]) 11/03/2014  . Breast cancer ([place])     PAST SURGICAL HISTORY: Past Surgical History  Procedure Laterality Date  . Cholecystectomy    . Coronary angioplasty    . Coronary artery bypass graft    . Appendectomy    . Mastectomy    . Ablation of dysrhythmic focus    . Cardioversion    . Joint replacement      tkr  . Insert / replace / remove pacemaker    . Peripheral vascular catheterization N/A 11/05/2014    Procedure: Porta Cath Insertion;  Surgeon:  Algernon Huxley, MD;  Location: [facility];  Service: Cardiovascular;  Laterality: N/A;  . Endobronchial ultrasound N/A 10/24/2014    Procedure: ENDOBRONCHIAL ULTRASOUND;  Surgeon: Vilinda Boehringer, MD;  Location: [facility];  Service: Cardiopulmonary;  Laterality: N/A;    FAMILY HISTORY No family history on file.  GYNECOLOGIC HISTORY:  No LMP recorded. Patient is  postmenopausal.     ADVANCED DIRECTIVES:    HEALTH MAINTENANCE: Social History  Substance Use Topics  . Smoking status: Former Smoker    Quit date: 10/21/1997  . Smokeless tobacco: Never Used  . Alcohol Use: No     Colonoscopy:  PAP:  Bone density:  Lipid panel:  Allergies  Allergen Reactions  . Amoxicillin-Pot Clavulanate Hives and Diarrhea  . Fentanyl Itching  . Codeine Nausea And Vomiting and Other (See Comments)    Other Reaction: "very ill"  . Latex Rash  . Other Rash    Current Outpatient Prescriptions  Medication Sig Dispense Refill  . albuterol (PROAIR HFA) 108 (90 BASE) MCG/ACT inhaler Inhale into the lungs.    . ALPRAZolam (XANAX) 0.5 MG tablet Take 0.5 mg by mouth once a week.     Marland Kitchen aspirin 81 MG tablet Take 81 mg by mouth daily.     Marland Kitchen bismuth subsalicylate (KAOPECTATE) 262 MG/15ML suspension Take 30 mLs by mouth 4 (four) times daily. 360 mL 0  . cyclobenzaprine (FLEXERIL) 5 MG tablet Take 5 mg by mouth daily as needed for muscle spasms.     . dabigatran (PRADAXA) 150 MG CAPS capsule TAKE ONE CAPSULE BY MOUTH TWICE DAILY    . dicyclomine (BENTYL) 10 MG capsule Take 10 mg by mouth 3 (three) times daily as needed (abdominal pain).     . fentaNYL (DURAGESIC - DOSED MCG/HR) 12 MCG/HR Place 1 patch (12.5 mcg total) onto the skin every 3 (three) days. 10 patch 0  . furosemide (LASIX) 40 MG tablet Take 40 mg by mouth daily.     Marland Kitchen gabapentin (NEURONTIN) 100 MG capsule Take 100 mg by mouth every morning.     Marland Kitchen glipiZIDE (GLUCOTROL) 10 MG tablet Take 10 mg by mouth 2 (two) times daily before a meal.   0  . insulin lispro (HUMALOG) 100 UNIT/ML KiwkPen as per sliding scale< than 150-- No insulin150-200---4 units 201-250---6 units251-300--- 8 units301-350----10 units.351-400---12 units    . isosorbide mononitrate (IMDUR) 60 MG 24 hr tablet Take 60 mg by mouth daily.     Marland Kitchen levothyroxine (SYNTHROID, LEVOTHROID) 25 MCG tablet Take 25 mcg by mouth daily before breakfast.     .  lidocaine-prilocaine (EMLA) cream Apply 1 application topically as needed. 30 g 3  . Liraglutide 18 MG/3ML SOPN Inject 3 mLs into the skin daily.     Marland Kitchen losartan (COZAAR) 100 MG tablet Take 100 mg by mouth daily.     . magic mouthwash w/lidocaine SOLN Take 5 mLs by mouth 4 (four) times daily as needed for mouth pain. 480 mL 3  . Magnesium-Calcium-Folic Acid 532-992-4 MG TABS Take 1 tablet by mouth daily.     . metFORMIN (GLUCOPHAGE) 1000 MG tablet Take 1,000 mg by mouth 2 (two) times daily.     . metoprolol-hydrochlorothiazide (LOPRESSOR HCT) 50-25 MG per tablet Take 1 tablet by mouth daily.     . metroNIDAZOLE (FLAGYL) 500 MG tablet Take 1 tablet (500 mg total) by mouth 3 (three) times daily. 21 tablet 0  . nitroGLYCERIN (NITROSTAT) 0.4 MG SL tablet Place 0.4 mg under the  tongue every 5 (five) minutes as needed for chest pain.     Marland Kitchen ondansetron (ZOFRAN) 8 MG tablet Take 1 tablet (8 mg total) by mouth 2 (two) times daily. Start the day after chemo for 3 days. Then take as needed for nausea or vomiting. 30 tablet 2  . oxyCODONE (OXY IR/ROXICODONE) 5 MG immediate release tablet Take 1 tablet (5 mg total) by mouth every 4 (four) hours as needed for severe pain. 30 tablet 0  . potassium chloride (K-DUR) 10 MEQ tablet Take 10 mEq by mouth 2 (two) times daily.     . promethazine (PHENERGAN) 25 MG tablet TAKE ONE TABLET BY MOUTH EVERY SIX HOURSAS NEEDED FOR NAUSEA OR VOMITING 30 tablet 0  . RABEprazole (ACIPHEX) 20 MG tablet Take 20 mg by mouth daily.     . simvastatin (ZOCOR) 20 MG tablet Take 20 mg by mouth every evening.      No current facility-administered medications for this visit.   Facility-Administered Medications Ordered in Other Visits  Medication Dose Route Frequency Provider Last Rate Last Dose  . heparin lock flush 100 unit/mL  500 Units Intravenous Once Forest Gleason, MD   500 Units at 11/20/14 1116  . sodium chloride 0.9 % injection 10 mL  10 mL Intravenous PRN Forest Gleason, MD   10 mL  at 11/20/14 0916  . sodium chloride 0.9 % injection 10 mL  10 mL Intravenous PRN Forest Gleason, MD   10 mL at 01/21/15 0915    OBJECTIVE: BP 152/82 mmHg  Pulse 71  Temp(Src) 96.9 F (36.1 C) (Tympanic)  Resp 18  Wt   SpO2 83%   There is no weight on file to calculate BMI.    ECOG FS:3 - Symptomatic, >50% confined to bed  General: NAD at this time, sitting in wheelchair. Eyes: Pink conjunctiva, anicteric sclera. HEENT: Normocephalic, moist mucous membranes, clear oropharnyx. Lungs: Diminished air entry bilaterally. Heart: Regular rate and rhythm. No rubs, murmurs, or gallops. Abdomen: Soft, nondistended. Palpable liver, tenderness left upper quadrant. Musculoskeletal: No edema, cyanosis, or clubbing. Neuro: Alert, answering all questions appropriately. Cranial nerves grossly intact. Skin: No rashes or petechiae noted. Psych: Normal affect.   LAB RESULTS:  Infusion on 02/13/2015  Component Date Value Ref Range Status  . WBC 02/13/2015 2.0* 3.6 - 11.0 K/uL Final  . RBC 02/13/2015 3.07* 3.80 - 5.20 MIL/uL Final  . Hemoglobin 02/13/2015 9.0* 12.0 - 16.0 g/dL Final  . HCT 02/13/2015 27.8* 35.0 - 47.0 % Final  . MCV 02/13/2015 90.6  80.0 - 100.0 fL Final  . MCH 02/13/2015 29.5  26.0 - 34.0 pg Final  . MCHC 02/13/2015 32.5  32.0 - 36.0 g/dL Final  . RDW 02/13/2015 27.0* 11.5 - 14.5 % Final  . Platelets 02/13/2015 60* 150 - 440 K/uL Final  . Neutrophils Relative % 02/13/2015 47   Final  . Neutro Abs 02/13/2015 0.9* 1.4 - 6.5 K/uL Final  . Lymphocytes Relative 02/13/2015 39   Final  . Lymphs Abs 02/13/2015 0.8* 1.0 - 3.6 K/uL Final  . Monocytes Relative 02/13/2015 7   Final  . Monocytes Absolute 02/13/2015 0.1* 0.2 - 0.9 K/uL Final  . Eosinophils Relative 02/13/2015 7   Final  . Eosinophils Absolute 02/13/2015 0.1  0 - 0.7 K/uL Final  . Basophils Relative 02/13/2015 0   Final  . Basophils Absolute 02/13/2015 0.0  0 - 0.1 K/uL Final  . Magnesium 02/13/2015 1.6* 1.7 - 2.4 mg/dL  Final  . Sodium 02/13/2015 136  135 - 145 mmol/L Final  . Potassium 02/13/2015 3.1* 3.5 - 5.1 mmol/L Final  . Chloride 02/13/2015 111  101 - 111 mmol/L Final  . CO2 02/13/2015 21* 22 - 32 mmol/L Final  . Glucose, Bld 02/13/2015 150* 65 - 99 mg/dL Final  . BUN 02/13/2015 11  6 - 20 mg/dL Final  . Creatinine, Ser 02/13/2015 0.77  0.44 - 1.00 mg/dL Final  . Calcium 02/13/2015 7.8* 8.9 - 10.3 mg/dL Final  . GFR calc non Af Amer 02/13/2015 >60  >60 mL/min Final  . GFR calc Af Amer 02/13/2015 >60  >60 mL/min Final   Comment: (NOTE) The eGFR has been calculated using the CKD EPI equation. This calculation has not been validated in all clinical situations. eGFR's persistently <60 mL/min signify possible Chronic Kidney Disease.   . Anion gap 02/13/2015 4* 5 - 15 Final    STUDIES: No results found.  ASSESSMENT:  Right upper lobe carcinoma of lung. Acute shortness of breath. Pulmonary edema. Diarrhea.  PLAN:   1. Lung CA. Consistent with small cell undifferentiated lung cancer, stage IV disease. Patient has been under treatment with carboplatin and CPT-11. She received cycle 1 day 1 on 01/28/2015. 2. Acute shortness of breath. Patient desats to 83% with just 4-5 steps on room air. Home oxygen referral has been sent. Patient was sent for CT scan to evaluate for PE. Report was called for radiology and there is no pulmonary embolism identified. However, patient is noted to have moderate sized right pleural effusion which is enlarged and interval as well as new left-sided pleural effusion which is also moderate in size. CT scan also describes stable dense consolidation within the right upper lobe as well as new groundglass opacities and interstitial thickening within both lungs right greater than left, these findings most likely suggestive of congestive heart failure with pulmonary edema. 3. Pulmonary edema secondary to CHF. Will send patient for ultrasound-guided thoracentesis of right pleural  effusion. Pleural fluid will be sent for cell count as well as cytology. Contacted patient's cardiologist, Dr. Josefa Half, to set up follow-up for patient. Cardiology receptionist requested patient to call for follow-up appointment, patient made aware that she would be to call their office. Advised patient to start Lasix 40 mg once daily as well as potassium 20 mEq twice daily. 4. Diarrhea. CDiff reported as negative. Stool cultures are pending. Patient has received injection of Sandostatin LAR yesterday on October 5. Advised patient to continue with Kaopectate as needed.  Patient instructed to keep follow-up appointment that was previously scheduled for tomorrow.  Patient expressed understanding and was in agreement with this plan. She also understands that She can call clinic at any time with any questions, concerns, or complaints.   Dr.Finnegan was available for consultation and review of plan of care for this patient.  Cancer of upper lobe of right lung [facility])   Staging form: Lung, AJCC 7th Edition     Clinical: T2, N2, M1 - Signed by Forest Gleason, MD on 11/03/2014   Evlyn Kanner, NP   02/14/2015 11:58 AM

## 2015-02-15 ENCOUNTER — Encounter: Payer: Self-pay | Admitting: Family Medicine

## 2015-02-15 ENCOUNTER — Inpatient Hospital Stay: Payer: Medicare Other

## 2015-02-15 ENCOUNTER — Other Ambulatory Visit: Payer: Self-pay | Admitting: Oncology

## 2015-02-15 ENCOUNTER — Telehealth: Payer: Self-pay | Admitting: *Deleted

## 2015-02-15 ENCOUNTER — Other Ambulatory Visit: Payer: Self-pay | Admitting: Family Medicine

## 2015-02-15 ENCOUNTER — Inpatient Hospital Stay (HOSPITAL_BASED_OUTPATIENT_CLINIC_OR_DEPARTMENT_OTHER): Payer: Medicare Other | Admitting: Family Medicine

## 2015-02-15 VITALS — BP 108/65 | HR 71 | Temp 96.9°F | Resp 20 | Wt 163.0 lb

## 2015-02-15 DIAGNOSIS — T451X5A Adverse effect of antineoplastic and immunosuppressive drugs, initial encounter: Secondary | ICD-10-CM | POA: Diagnosis not present

## 2015-02-15 DIAGNOSIS — F329 Major depressive disorder, single episode, unspecified: Secondary | ICD-10-CM

## 2015-02-15 DIAGNOSIS — E119 Type 2 diabetes mellitus without complications: Secondary | ICD-10-CM

## 2015-02-15 DIAGNOSIS — I4891 Unspecified atrial fibrillation: Secondary | ICD-10-CM

## 2015-02-15 DIAGNOSIS — E878 Other disorders of electrolyte and fluid balance, not elsewhere classified: Secondary | ICD-10-CM

## 2015-02-15 DIAGNOSIS — Z86010 Personal history of colonic polyps: Secondary | ICD-10-CM

## 2015-02-15 DIAGNOSIS — J90 Pleural effusion, not elsewhere classified: Secondary | ICD-10-CM

## 2015-02-15 DIAGNOSIS — Z79899 Other long term (current) drug therapy: Secondary | ICD-10-CM

## 2015-02-15 DIAGNOSIS — Z7982 Long term (current) use of aspirin: Secondary | ICD-10-CM

## 2015-02-15 DIAGNOSIS — I2510 Atherosclerotic heart disease of native coronary artery without angina pectoris: Secondary | ICD-10-CM

## 2015-02-15 DIAGNOSIS — K219 Gastro-esophageal reflux disease without esophagitis: Secondary | ICD-10-CM

## 2015-02-15 DIAGNOSIS — I509 Heart failure, unspecified: Secondary | ICD-10-CM

## 2015-02-15 DIAGNOSIS — E860 Dehydration: Secondary | ICD-10-CM

## 2015-02-15 DIAGNOSIS — C787 Secondary malignant neoplasm of liver and intrahepatic bile duct: Secondary | ICD-10-CM | POA: Diagnosis not present

## 2015-02-15 DIAGNOSIS — Z87891 Personal history of nicotine dependence: Secondary | ICD-10-CM

## 2015-02-15 DIAGNOSIS — M1990 Unspecified osteoarthritis, unspecified site: Secondary | ICD-10-CM

## 2015-02-15 DIAGNOSIS — Z950 Presence of cardiac pacemaker: Secondary | ICD-10-CM

## 2015-02-15 DIAGNOSIS — R531 Weakness: Secondary | ICD-10-CM

## 2015-02-15 DIAGNOSIS — I10 Essential (primary) hypertension: Secondary | ICD-10-CM

## 2015-02-15 DIAGNOSIS — G8929 Other chronic pain: Secondary | ICD-10-CM

## 2015-02-15 DIAGNOSIS — R197 Diarrhea, unspecified: Secondary | ICD-10-CM

## 2015-02-15 DIAGNOSIS — C3411 Malignant neoplasm of upper lobe, right bronchus or lung: Secondary | ICD-10-CM

## 2015-02-15 DIAGNOSIS — Z794 Long term (current) use of insulin: Secondary | ICD-10-CM

## 2015-02-15 DIAGNOSIS — Z23 Encounter for immunization: Secondary | ICD-10-CM

## 2015-02-15 DIAGNOSIS — M545 Low back pain: Secondary | ICD-10-CM

## 2015-02-15 DIAGNOSIS — R5383 Other fatigue: Secondary | ICD-10-CM

## 2015-02-15 DIAGNOSIS — Z853 Personal history of malignant neoplasm of breast: Secondary | ICD-10-CM

## 2015-02-15 DIAGNOSIS — R0602 Shortness of breath: Secondary | ICD-10-CM

## 2015-02-15 DIAGNOSIS — J811 Chronic pulmonary edema: Secondary | ICD-10-CM

## 2015-02-15 LAB — BASIC METABOLIC PANEL
Anion gap: 8 (ref 5–15)
BUN: 9 mg/dL (ref 6–20)
CHLORIDE: 104 mmol/L (ref 101–111)
CO2: 24 mmol/L (ref 22–32)
CREATININE: 0.92 mg/dL (ref 0.44–1.00)
Calcium: 7.8 mg/dL — ABNORMAL LOW (ref 8.9–10.3)
GFR calc Af Amer: >60 mL/min (ref >60)
GFR calc non Af Amer: 58 mL/min — ABNORMAL LOW (ref >60)
GLUCOSE: 218 mg/dL — AB (ref 65–99)
Potassium: 2.6 mmol/L — CL (ref 3.5–5.1)
SODIUM: 136 mmol/L (ref 135–145)

## 2015-02-15 LAB — MAGNESIUM: MAGNESIUM: 1.3 mg/dL — AB (ref 1.7–2.4)

## 2015-02-15 MED ORDER — SODIUM CHLORIDE 0.9 % IV SOLN
INTRAVENOUS | Status: DC
  Administered 2015-02-15: 12:00:00 via INTRAVENOUS
  Filled 2015-02-15: qty 250

## 2015-02-15 MED ORDER — MAGNESIUM SULFATE 50 % IJ SOLN: 4.0000 g | Freq: Once | INTRAMUSCULAR | Status: DC

## 2015-02-15 MED ORDER — HEPARIN SOD (PORK) LOCK FLUSH 100 UNIT/ML IV SOLN
INTRAVENOUS | Status: AC
  Filled 2015-02-15: qty 5

## 2015-02-15 MED ORDER — HEPARIN SOD (PORK) LOCK FLUSH 100 UNIT/ML IV SOLN
500.0000 [IU] | Freq: Once | INTRAVENOUS | Status: AC
  Administered 2015-02-15: 500 [IU] via INTRAVENOUS

## 2015-02-15 MED ORDER — CLINDAMYCIN HCL 150 MG PO CAPS: 150.0000 mg | ORAL_CAPSULE | Freq: Three times a day (TID) | ORAL | Status: DC

## 2015-02-15 MED ORDER — SODIUM CHLORIDE 0.9 % IV SOLN: 40.0000 meq | Freq: Once | INTRAVENOUS | Status: DC

## 2015-02-15 MED ORDER — SODIUM CHLORIDE 0.9 % IJ SOLN
10.0000 mL | INTRAMUSCULAR | Status: DC | PRN
  Administered 2015-02-15: 10 mL via INTRAVENOUS
  Filled 2015-02-15: qty 10

## 2015-02-15 NOTE — Telephone Encounter (Signed)
Stool culture positive for CAMPYLOBACTER. L Herring, AGNP-C  Notified verbally

## 2015-02-15 NOTE — Progress Notes (Signed)
[facility]  [phone]  [phone]     Wendy Giles DOB: 1933-03-09  MR#: [id number]  CXK#:[id number]  Patient Care Team: Tracie Harrier, MD as PCP - General (Internal Medicine) Isaias Cowman, MD as Consulting Physician (Cardiology)  CHIEF COMPLAINT:  Chief Complaint  Patient presents with  . Cancer    Pt is here for a follow up and possible IVF.  She had a pleuracentesis yesterday.  She stated they pulled off 1/2 liter.      INTERVAL HISTORY:  Patient is here for further evaluation regarding yesterdays visit with complaint of shortness of breath. She was sent for CT scan of chest that was reported as negative for PE but noted bilateral pleural effusions, right greater than left, as well as concern for congestive heart failure with pulmonary edema. She was sent for ultrasound guided thoracentesis yesterday and had 600 in mls of fluid drawn from her right lung, cytology is pending. She was also started on furosemide 40 mg yesterday twice a day as well as potassium 20 mEq twice a day. Discussed care with her cardiologist's office and they are going to call her for follow-up next week. She reports that since thoracentesis or shortness of breath has greatly improved and is only mild now. She did qualify for home oxygen therapy and this will be delivered today.  REVIEW OF SYSTEMS:   Review of Systems  Constitutional: Positive for malaise/fatigue. Negative for fever, chills, weight loss and diaphoresis.  HENT: Negative for congestion, ear discharge, ear pain, hearing loss, nosebleeds, sore throat and tinnitus.   Eyes: Negative for blurred vision, double vision, photophobia, pain, discharge and redness.  Respiratory: Positive for shortness of breath. Negative for cough, hemoptysis, sputum production, wheezing and stridor.        Persistent mild shortness of breath, greatly improved from yesterday.  Cardiovascular: Negative for chest pain,  palpitations, orthopnea, claudication, leg swelling and PND.  Gastrointestinal: Positive for diarrhea. Negative for heartburn, nausea, vomiting, abdominal pain, constipation, blood in stool and melena.  Genitourinary: Negative.   Musculoskeletal: Negative.   Skin: Negative.   Neurological: Positive for weakness. Negative for dizziness, tingling, focal weakness, seizures and headaches.  Endo/Heme/Allergies: Does not bruise/bleed easily.  Psychiatric/Behavioral: Negative for depression. The patient is not nervous/anxious and does not have insomnia.     As per HPI. Otherwise, a complete review of systems is negatve.  ONCOLOGY HISTORY: Oncology History   1.  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  Previous history of carcinoma breast stage I disease 3.  Started on chemotherapy with carboplatinum and VP-16 from November 20, 2014 MRI scan of brain was negative (July, 2016)     Cancer of upper lobe of right lung ([place])   11/03/2014 Initial Diagnosis Cancer of upper lobe of right lung    PAST MEDICAL HISTORY: Past Medical History  Diagnosis Date  . CHF (congestive heart failure) ([place])   . Diabetes mellitus without complication ([place])   . Hypertension   . Presence of permanent cardiac pacemaker   . Dysrhythmia     afib  . Depression   . GERD (gastroesophageal reflux disease)   . Coronary artery disease   . Cancer ([place])     breast,uterine  . COPD (chronic obstructive pulmonary disease) ([place])   . Arthritis   . Anemia   . Anginal pain ([place])   .  Cancer of upper lobe of right lung ([place]) 11/03/2014  . Breast cancer ([place])     PAST SURGICAL HISTORY: Past Surgical History  Procedure Laterality Date  . Cholecystectomy    . Coronary angioplasty    . Coronary artery bypass graft    . Appendectomy    . Mastectomy    . Ablation of  dysrhythmic focus    . Cardioversion    . Joint replacement      tkr  . Insert / replace / remove pacemaker    . Peripheral vascular catheterization N/A 11/05/2014    Procedure: Glori Luis Cath Insertion;  Surgeon: Algernon Huxley, MD;  Location: [facility];  Service: Cardiovascular;  Laterality: N/A;  . Endobronchial ultrasound N/A 10/24/2014    Procedure: ENDOBRONCHIAL ULTRASOUND;  Surgeon: Vilinda Boehringer, MD;  Location: [facility];  Service: Cardiopulmonary;  Laterality: N/A;    FAMILY HISTORY History reviewed. No pertinent family history.  GYNECOLOGIC HISTORY:  No LMP recorded. Patient is postmenopausal.     ADVANCED DIRECTIVES:    HEALTH MAINTENANCE: Social History  Substance Use Topics  . Smoking status: Former Smoker    Quit date: 10/21/1997  . Smokeless tobacco: Never Used  . Alcohol Use: No     Colonoscopy:  PAP:  Bone density:  Lipid panel:  Allergies  Allergen Reactions  . Amoxicillin-Pot Clavulanate Hives and Diarrhea  . Fentanyl Itching  . Codeine Nausea And Vomiting and Other (See Comments)    Other Reaction: "very ill"  . Latex Rash  . Other Rash    Current Outpatient Prescriptions  Medication Sig Dispense Refill  . albuterol (PROAIR HFA) 108 (90 BASE) MCG/ACT inhaler Inhale into the lungs.    . ALPRAZolam (XANAX) 0.5 MG tablet Take 0.5 mg by mouth once a week.     Marland Kitchen aspirin 81 MG tablet Take 81 mg by mouth daily.     Marland Kitchen bismuth subsalicylate (KAOPECTATE) 262 MG/15ML suspension Take 30 mLs by mouth 4 (four) times daily. 360 mL 0  . cyclobenzaprine (FLEXERIL) 5 MG tablet Take 5 mg by mouth daily as needed for muscle spasms.     . dabigatran (PRADAXA) 150 MG CAPS capsule TAKE ONE CAPSULE BY MOUTH TWICE DAILY    . dicyclomine (BENTYL) 10 MG capsule Take 10 mg by mouth 3 (three) times daily as needed (abdominal pain).     . fentaNYL (DURAGESIC - DOSED MCG/HR) 12 MCG/HR Place 1 patch (12.5 mcg total) onto the skin every 3 (three) days. 10 patch 0  .  furosemide (LASIX) 40 MG tablet Take 40 mg by mouth 2 (two) times daily.     Marland Kitchen gabapentin (NEURONTIN) 100 MG capsule Take 100 mg by mouth every morning.     Marland Kitchen glipiZIDE (GLUCOTROL) 10 MG tablet Take 10 mg by mouth 2 (two) times daily before a meal.   0  . insulin lispro (HUMALOG) 100 UNIT/ML KiwkPen as per sliding scale< than 150-- No insulin150-200---4 units 201-250---6 units251-300--- 8 units301-350----10 units.351-400---12 units    . isosorbide mononitrate (IMDUR) 60 MG 24 hr tablet Take 60 mg by mouth daily.     Marland Kitchen levothyroxine (SYNTHROID, LEVOTHROID) 25 MCG tablet Take 25 mcg by mouth daily before breakfast.     . lidocaine-prilocaine (EMLA) cream Apply 1 application topically as needed. 30 g 3  . Liraglutide 18 MG/3ML SOPN Inject 3 mLs into the skin daily.     Marland Kitchen losartan (COZAAR) 100 MG tablet Take 100 mg by mouth daily.     Marland Kitchen  magic mouthwash w/lidocaine SOLN Take 5 mLs by mouth 4 (four) times daily as needed for mouth pain. 480 mL 3  . Magnesium-Calcium-Folic Acid 621-308-6 MG TABS Take 1 tablet by mouth daily.     . metFORMIN (GLUCOPHAGE) 1000 MG tablet Take 1,000 mg by mouth 2 (two) times daily.     . metoprolol-hydrochlorothiazide (LOPRESSOR HCT) 50-25 MG per tablet Take 1 tablet by mouth daily.     . metroNIDAZOLE (FLAGYL) 500 MG tablet Take 1 tablet (500 mg total) by mouth 3 (three) times daily. 21 tablet 0  . nitroGLYCERIN (NITROSTAT) 0.4 MG SL tablet Place 0.4 mg under the tongue every 5 (five) minutes as needed for chest pain.     Marland Kitchen ondansetron (ZOFRAN) 8 MG tablet Take 1 tablet (8 mg total) by mouth 2 (two) times daily. Start the day after chemo for 3 days. Then take as needed for nausea or vomiting. 30 tablet 2  . oxyCODONE (OXY IR/ROXICODONE) 5 MG immediate release tablet Take 1 tablet (5 mg total) by mouth every 4 (four) hours as needed for severe pain. 30 tablet 0  . potassium chloride (K-DUR) 10 MEQ tablet Take 2 tablets (20 mEq total) by mouth daily. 60 tablet 0  .  promethazine (PHENERGAN) 25 MG tablet TAKE ONE TABLET BY MOUTH EVERY SIX HOURSAS NEEDED FOR NAUSEA OR VOMITING 30 tablet 0  . RABEprazole (ACIPHEX) 20 MG tablet Take 20 mg by mouth daily.     . simvastatin (ZOCOR) 20 MG tablet Take 20 mg by mouth every evening.      Current Facility-Administered Medications  Medication Dose Route Frequency Provider Last Rate Last Dose  . sodium chloride 0.9 % 250 mL with potassium chloride 40 mEq, magnesium sulfate 4 g infusion   Intravenous Continuous Evlyn Kanner, NP       Facility-Administered Medications Ordered in Other Visits  Medication Dose Route Frequency Provider Last Rate Last Dose  . heparin lock flush 100 unit/mL  500 Units Intravenous Once Forest Gleason, MD   500 Units at 11/20/14 1116  . heparin lock flush 100 unit/mL  500 Units Intravenous Once Forest Gleason, MD      . sodium chloride 0.9 % injection 10 mL  10 mL Intravenous PRN Forest Gleason, MD   10 mL at 11/20/14 0916  . sodium chloride 0.9 % injection 10 mL  10 mL Intravenous PRN Forest Gleason, MD   10 mL at 01/21/15 0915  . sodium chloride 0.9 % injection 10 mL  10 mL Intravenous PRN Forest Gleason, MD        OBJECTIVE: BP 108/65 mmHg  Pulse 71  Temp(Src) 96.9 F (36.1 C) (Tympanic)  Resp 20  Wt 163 lb (73.936 kg)   Body mass index is 28.88 kg/(m^2).    ECOG FS:3 - Symptomatic, >50% confined to bed  General: NAD at this time, sitting in wheelchair. Eyes: Pink conjunctiva, anicteric sclera. HEENT: Normocephalic, moist mucous membranes, clear oropharnyx. Lungs: Air entry improved from yesterday, remain diminished in left lower lobe Heart: Regular rate and rhythm. No rubs, murmurs, or gallops. Abdomen: Soft, nondistended. Palpable liver, tenderness left upper quadrant. Musculoskeletal: No edema, cyanosis, or clubbing. Neuro: Alert, answering all questions appropriately. Cranial nerves grossly intact. Skin: No rashes or petechiae noted. Psych: Normal affect.   LAB  RESULTS:  Infusion on 02/15/2015  Component Date Value Ref Range Status  . Sodium 02/15/2015 136  135 - 145 mmol/L Final  . Potassium 02/15/2015 2.6* 3.5 - 5.1 mmol/L Final  Comment: RESULTS VERIFIED BY REPEAT TESTING CRITICAL RESULT CALLED TO, READ BACK BY AND VERIFIED WITH NYO TO LORI JONES 1044 02/15/15   . Chloride 02/15/2015 104  101 - 111 mmol/L Final  . CO2 02/15/2015 24  22 - 32 mmol/L Final  . Glucose, Bld 02/15/2015 218* 65 - 99 mg/dL Final  . BUN 02/15/2015 9  6 - 20 mg/dL Final  . Creatinine, Ser 02/15/2015 0.92  0.44 - 1.00 mg/dL Final  . Calcium 02/15/2015 7.8* 8.9 - 10.3 mg/dL Final  . GFR calc non Af Amer 02/15/2015 58* >60 mL/min Final  . GFR calc Af Amer 02/15/2015 >60  >60 mL/min Final   Comment: (NOTE) The eGFR has been calculated using the CKD EPI equation. This calculation has not been validated in all clinical situations. eGFR's persistently <60 mL/min signify possible Chronic Kidney Disease.   . Anion gap 02/15/2015 8  5 - 15 Final  . Magnesium 02/15/2015 1.3* 1.7 - 2.4 mg/dL Final  Hospital Outpatient Visit on 02/14/2015  Component Date Value Ref Range Status  . Fluid Type-FCT 02/14/2015 CYTO PLEU   Final  . Color, Fluid 02/14/2015 YELLOW  YELLOW Final  . Appearance, Fluid 02/14/2015 TURBID* CLEAR Final  . WBC, Fluid 02/14/2015 478   Final  . Neutrophil Count, Fluid 02/14/2015 3   Final  . Lymphs, Fluid 02/14/2015 2   Final  . Monocyte-Macrophage-Serous Fluid 02/14/2015 95   Final  . Eos, Fluid 02/14/2015 0   Final  . Other Cells, Fluid 02/14/2015 0   Final  . Specimen Description 02/14/2015 CYTO PLEU   Final  . Special Requests 02/14/2015 NONE   Final  . Gram Stain 02/14/2015 PENDING   Incomplete  . Culture 02/14/2015 NO GROWTH < 24 HOURS   Final  . Report Status 02/14/2015 PENDING   Incomplete  Appointment on 02/14/2015  Component Date Value Ref Range Status  . WBC 02/14/2015 1.8* 3.6 - 11.0 K/uL Final  . RBC 02/14/2015 3.22* 3.80 - 5.20 MIL/uL  Final  . Hemoglobin 02/14/2015 9.5* 12.0 - 16.0 g/dL Final  . HCT 02/14/2015 29.6* 35.0 - 47.0 % Final  . MCV 02/14/2015 91.9  80.0 - 100.0 fL Final  . MCH 02/14/2015 29.5  26.0 - 34.0 pg Final  . MCHC 02/14/2015 32.1  32.0 - 36.0 g/dL Final  . RDW 02/14/2015 26.8* 11.5 - 14.5 % Final  . Platelets 02/14/2015 63* 150 - 440 K/uL Final  . Neutrophils Relative % 02/14/2015 75   Final  . Neutro Abs 02/14/2015 1.3* 1.4 - 6.5 K/uL Final  . Lymphocytes Relative 02/14/2015 18   Final  . Lymphs Abs 02/14/2015 0.3* 1.0 - 3.6 K/uL Final  . Monocytes Relative 02/14/2015 7   Final  . Monocytes Absolute 02/14/2015 0.1* 0.2 - 0.9 K/uL Final  . Eosinophils Relative 02/14/2015 0   Final  . Eosinophils Absolute 02/14/2015 0.0  0 - 0.7 K/uL Final  . Basophils Relative 02/14/2015 0   Final  . Basophils Absolute 02/14/2015 0.0  0 - 0.1 K/uL Final  . Sodium 02/14/2015 134* 135 - 145 mmol/L Final  . Potassium 02/14/2015 3.3* 3.5 - 5.1 mmol/L Final  . Chloride 02/14/2015 110  101 - 111 mmol/L Final  . CO2 02/14/2015 20* 22 - 32 mmol/L Final  . Glucose, Bld 02/14/2015 210* 65 - 99 mg/dL Final  . BUN 02/14/2015 11  6 - 20 mg/dL Final  . Creatinine, Ser 02/14/2015 0.68  0.44 - 1.00 mg/dL Final  . Calcium 02/14/2015  7.7* 8.9 - 10.3 mg/dL Final  . GFR calc non Af Amer 02/14/2015 >60  >60 mL/min Final  . GFR calc Af Amer 02/14/2015 >60  >60 mL/min Final   Comment: (NOTE) The eGFR has been calculated using the CKD EPI equation. This calculation has not been validated in all clinical situations. eGFR's persistently <60 mL/min signify possible Chronic Kidney Disease.   . Anion gap 02/14/2015 4* 5 - 15 Final  . Magnesium 02/14/2015 1.9  1.7 - 2.4 mg/dL Final  Appointment on 02/14/2015  Component Date Value Ref Range Status  . Specimen Description 02/14/2015 STOOL   Final  . Special Requests 02/14/2015 NONE   Final  . Culture 02/14/2015 NO SALMONELLA OR SHIGELLA ISOLATED   Final  . Report Status 02/14/2015  PENDING   Incomplete  . C Diff antigen 02/14/2015 NEGATIVE  NEGATIVE Final  . C Diff toxin 02/14/2015 NEGATIVE  NEGATIVE Final  . C Diff interpretation 02/14/2015 Negative for C. difficile   Final  Infusion on 02/13/2015  Component Date Value Ref Range Status  . WBC 02/13/2015 2.0* 3.6 - 11.0 K/uL Final  . RBC 02/13/2015 3.07* 3.80 - 5.20 MIL/uL Final  . Hemoglobin 02/13/2015 9.0* 12.0 - 16.0 g/dL Final  . HCT 02/13/2015 27.8* 35.0 - 47.0 % Final  . MCV 02/13/2015 90.6  80.0 - 100.0 fL Final  . MCH 02/13/2015 29.5  26.0 - 34.0 pg Final  . MCHC 02/13/2015 32.5  32.0 - 36.0 g/dL Final  . RDW 02/13/2015 27.0* 11.5 - 14.5 % Final  . Platelets 02/13/2015 60* 150 - 440 K/uL Final  . Neutrophils Relative % 02/13/2015 47   Final  . Neutro Abs 02/13/2015 0.9* 1.4 - 6.5 K/uL Final  . Lymphocytes Relative 02/13/2015 39   Final  . Lymphs Abs 02/13/2015 0.8* 1.0 - 3.6 K/uL Final  . Monocytes Relative 02/13/2015 7   Final  . Monocytes Absolute 02/13/2015 0.1* 0.2 - 0.9 K/uL Final  . Eosinophils Relative 02/13/2015 7   Final  . Eosinophils Absolute 02/13/2015 0.1  0 - 0.7 K/uL Final  . Basophils Relative 02/13/2015 0   Final  . Basophils Absolute 02/13/2015 0.0  0 - 0.1 K/uL Final  . Magnesium 02/13/2015 1.6* 1.7 - 2.4 mg/dL Final  . Sodium 02/13/2015 136  135 - 145 mmol/L Final  . Potassium 02/13/2015 3.1* 3.5 - 5.1 mmol/L Final  . Chloride 02/13/2015 111  101 - 111 mmol/L Final  . CO2 02/13/2015 21* 22 - 32 mmol/L Final  . Glucose, Bld 02/13/2015 150* 65 - 99 mg/dL Final  . BUN 02/13/2015 11  6 - 20 mg/dL Final  . Creatinine, Ser 02/13/2015 0.77  0.44 - 1.00 mg/dL Final  . Calcium 02/13/2015 7.8* 8.9 - 10.3 mg/dL Final  . GFR calc non Af Amer 02/13/2015 >60  >60 mL/min Final  . GFR calc Af Amer 02/13/2015 >60  >60 mL/min Final   Comment: (NOTE) The eGFR has been calculated using the CKD EPI equation. This calculation has not been validated in all clinical situations. eGFR's persistently <60  mL/min signify possible Chronic Kidney Disease.   . Anion gap 02/13/2015 4* 5 - 15 Final    STUDIES: Dg Chest 1 View  02/14/2015   CLINICAL DATA:  Post right thoracentesis.  EXAM: CHEST 1 VIEW  COMPARISON:  Chest CT 02/14/2015  FINDINGS: Stable enlargement of the cardiac silhouette with a dual chamber cardiac pacemaker and previous median sternotomy. Port-A-Cath tip in the SVC region. Mild blunting of the  right costophrenic angle suggestive for residual fluid or atelectasis. There is no evidence for pneumothorax. Vague opacities in the right upper lung compatible with known disease from the recent chest CT. Hazy densities at left lung base compatible with pleural fluid and atelectasis. Trachea is midline.  IMPRESSION: Negative for pneumothorax following the right thoracentesis.  Vague parenchymal densities in the right upper lung compatible with recent CT findings.  Cardiomegaly.  Left pleural fluid with left basilar atelectasis.   Electronically Signed   By: Markus Daft M.D.   On: 02/14/2015 16:43   Ct Angio Chest Pe W/cm &/or Wo Cm  02/14/2015   CLINICAL DATA:  Extreme shortness of breath, history of lung cancer and liver cancer, on chemotherapy now.  EXAM: CT ANGIOGRAPHY CHEST WITH CONTRAST  TECHNIQUE: Multidetector CT imaging of the chest was performed using the standard protocol during bolus administration of intravenous contrast. Multiplanar CT image reconstructions and MIPs were obtained to evaluate the vascular anatomy.  CONTRAST:  6m OMNIPAQUE IOHEXOL 350 MG/ML SOLN  COMPARISON:  CT chest dated 01/18/2015.  FINDINGS: There is no pulmonary embolism identified within the main, lobar, or central segmental pulmonary arteries bilaterally. Some of the most peripheral segmental and subsegmental pulmonary arteries are difficult to definitively characterize due to mild patient breathing motion artifact.  Heart is enlarged, similar to the previous noncontrast chest CT of 01/18/2015. Coronary artery  calcifications noted. Patient is status post surgical changes of median sternotomy and CABG.  Atherosclerotic changes are again seen along the walls of the normal-caliber thoracic aorta. The small and moderately enlarged lymph nodes within the mediastinum are not significantly changed in the short-term interval.  There is a new left pleural effusion which is moderate in size. The moderate- sized right pleural effusion has slightly increased in the interval. Associated mild compressive atelectasis noted at each lung base.  The ill-defined consolidation within the right upper lobe is unchanged, however, there are new ground-glass opacities and areas of interstitial thickening throughout the right upper lobe and right lower lobe. Milder ground-glass opacities and interstitial thickening noted on the left. There is also increased peribronchial thickening throughout both lungs bilaterally.  Moderate- sized hiatal hernia again noted. Limited images of the upper abdomen are otherwise unremarkable. Degenerative changes are again seen throughout the thoracolumbar spine without acute osseous abnormality. Stable 3.3 cm density in the left lateral breast is unchanged and likely related to previous lumpectomy.  Review of the MIP images confirms the above findings.  IMPRESSION: 1. No pulmonary embolism, with mild study limitations detailed above. 2. Moderate-sized right pleural effusion which is slightly enlarged in the interval. 3. New left-sided pleural effusion which is also moderate in size, slightly smaller than the right pleural effusion. 4. Stable dense consolidation within the right upper lobe. 5. New ground-glass opacities and interstitial thickening within both lungs, right greater than left, with additional bilateral peribronchial thickening, most likely representing developing congestive heart failure with pulmonary edema. Recommend follow-up chest CT at some point to ensure resolution, to exclude the less likely  possibility of this representing lymphangitic carcinomatosis. Of note, these new ground-glass opacities and interstitial thickening obscure the previously noted pulmonary nodules described on CT of 01/18/2015. These results were called by telephone at the time of interpretation on 02/14/2015 at 1:35 pm to Dr. LGeorgeanne Nim, who verbally acknowledged these results.   Electronically Signed   By: SFranki CabotM.D.   On: 02/14/2015 13:43   UKoreaThoracentesis Asp Pleural Space W/img Guide  02/14/2015  CLINICAL DATA:  History of lung cancer with pleural fluid.  EXAM: ULTRASOUND GUIDED RIGHT THORACENTESIS  COMPARISON:  Chest CT 02/14/2015  PROCEDURE: An ultrasound guided thoracentesis was thoroughly discussed with the patient and questions answered. The benefits, risks, alternatives and complications were also discussed. The patient understands and wishes to proceed with the procedure. Written consent was obtained.  Ultrasound was performed to localize and mark an adequate pocket of fluid in the right posterior chest. The area was then prepped and draped in the normal sterile fashion. 1% Lidocaine was used for local anesthesia. Under ultrasound guidance a Safe-T-Centesis catheter was introduced. Thoracentesis was performed. The catheter was removed and a dressing applied.  COMPLICATIONS: None.  FINDINGS: A total of approximately 600 mL of clear amber colored fluid was removed. A fluid sample wassent for laboratory analysis.  IMPRESSION: Successful ultrasound guided right thoracentesis yielding 600 mL of pleural fluid.   Electronically Signed   By: Markus Daft M.D.   On: 02/14/2015 16:39    ASSESSMENT:  Right upper lobe carcinoma of lung. Acute shortness of breath. Pleural effusion/ Pulmonary edema. Diarrhea.  PLAN:   1. Lung CA. Consistent with small cell undifferentiated lung cancer, stage IV disease. Patient has been under treatment with carboplatin and CPT-11. She received cycle 1 day 1 on 01/28/2015.  Patient is scheduled for next chemotherapy on October 13.  2. Acute shortness of breath. Patient desats to 83% with just 4-5 steps on room air. Home oxygen referral has been sent. Patient was sent for CT scan to evaluate for PE. Report was called from radiology and there is no pulmonary embolism identified. However, patient is noted to have moderate sized right pleural effusion which is enlarged and interval as well as new left-sided pleural effusion which is also moderate in size. CT scan also describes stable dense consolidation within the right upper lobe as well as new groundglass opacities and interstitial thickening within both lungs right greater than left, these findings most likely suggestive of congestive heart failure with pulmonary edema. 3. Pleural effusions/ Pulmonary edema secondary to CHF. Patient sent for thoracentesis of right pleural effusion.  600 mL of fluid were drawn from the right lung and sent for cytology which is still pending. We will schedule with ultrasound department for thoracentesis of the left lung next week. Her cardiologist's office was made aware and they will contact her for appointment.  4. Electrolyte imbalance. We will administer 40 mEq of potassium IV as well as 2 g of magnesium today. Advised patient tocontinuing Lasix 20 mg once daily as well as potassium 20 mEq twice daily. Also encouraged patient to increase dietary intake of potassium.  5. Diarrhea. CDiff reported as negative. Stool cultures reported as positive for Campylobacter. As patient has had diarrhea for several weeks now, we will proceed with treatment with clindamycin 150 mg 3 times per day. Patient to monitor stools for improvement. She also received Sandostatin injections previously this week.   We'll continue with follow-up as previously scheduled in one week.  Patient expressed understanding and was in agreement with this plan. She also understands that She can call clinic at any time with any  questions, concerns, or complaints.   Dr. Oliva Bustard  was available for consultation and review of plan of care for this patient.  Cancer of upper lobe of right lung [facility])   Staging form: Lung, AJCC 7th Edition     Clinical: T2, N2, M1 - Signed by Forest Gleason, MD on 11/03/2014   Magda Paganini  Valetta Close, NP   02/15/2015 11:07 AM      [facility]  [phone]  Fax:([phone]) [phone]     Wendy Giles DOB: 1932/09/27  MR#: [id number]  QIO#:[id number]  Patient Care Team: Tracie Harrier, MD as PCP - General (Internal Medicine) Isaias Cowman, MD as Consulting Physician (Cardiology)  CHIEF COMPLAINT:  Chief Complaint  Patient presents with  . Cancer    Pt is here for a follow up and possible IVF.  She had a pleuracentesis yesterday.  She stated they pulled off 1/2 liter.      INTERVAL HISTORY:  Patient is here as an acute add-on with severe complaints of shortness of breath. Patient has also had persistent diarrhea for the last several weeks. She was seen in clinic yesterday and received the Sandostatin LAR injection to aid in relief of diarrhea. Patient states that this morning she had to call EMS to deliver oxygen as she became extremely short of breath with exertion walking from bedroom to bathroom.  REVIEW OF SYSTEMS:   Review of Systems  Constitutional: Positive for malaise/fatigue. Negative for fever, chills, weight loss and diaphoresis.  HENT: Negative for congestion, ear discharge, ear pain, hearing loss, nosebleeds, sore throat and tinnitus.   Eyes: Negative for blurred vision, double vision, photophobia, pain, discharge and redness.  Respiratory: Positive for cough and shortness of breath. Negative for hemoptysis, sputum production, wheezing and stridor.   Cardiovascular: Negative for chest pain, palpitations, orthopnea, claudication, leg swelling and PND.  Gastrointestinal: Positive for diarrhea. Negative for heartburn, nausea, vomiting, abdominal  pain, constipation, blood in stool and melena.  Genitourinary: Negative.   Musculoskeletal: Negative.   Skin: Negative.   Neurological: Positive for weakness. Negative for dizziness, tingling, focal weakness, seizures and headaches.  Endo/Heme/Allergies: Does not bruise/bleed easily.  Psychiatric/Behavioral: Negative for depression. The patient is not nervous/anxious and does not have insomnia.     As per HPI. Otherwise, a complete review of systems is negatve.  ONCOLOGY HISTORY: Oncology History   1.  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  Previous history of carcinoma breast stage I disease 3.  Started on chemotherapy with carboplatinum and VP-16 from November 20, 2014 MRI scan of brain was negative (July, 2016)     Cancer of upper lobe of right lung ([place])   11/03/2014 Initial Diagnosis Cancer of upper lobe of right lung    PAST MEDICAL HISTORY: Past Medical History  Diagnosis Date  . CHF (congestive heart failure) ([place])   . Diabetes mellitus without complication ([place])   . Hypertension   . Presence of permanent cardiac pacemaker   . Dysrhythmia     afib  . Depression   . GERD (gastroesophageal reflux disease)   . Coronary artery disease   . Cancer ([place])     breast,uterine  . COPD (chronic obstructive pulmonary disease) ([place])   . Arthritis   . Anemia   . Anginal pain ([place])   . Cancer of upper lobe of right lung ([place]) 11/03/2014  . Breast cancer ([place])     PAST SURGICAL HISTORY: Past Surgical History  Procedure Laterality Date  . Cholecystectomy    . Coronary angioplasty    . Coronary artery bypass graft    . Appendectomy    . Mastectomy    . Ablation of dysrhythmic focus    . Cardioversion    .  Joint replacement      tkr  . Insert / replace / remove pacemaker    . Peripheral vascular catheterization N/A  11/05/2014    Procedure: Glori Luis Cath Insertion;  Surgeon: Algernon Huxley, MD;  Location: [facility];  Service: Cardiovascular;  Laterality: N/A;  . Endobronchial ultrasound N/A 10/24/2014    Procedure: ENDOBRONCHIAL ULTRASOUND;  Surgeon: Vilinda Boehringer, MD;  Location: [facility];  Service: Cardiopulmonary;  Laterality: N/A;    FAMILY HISTORY History reviewed. No pertinent family history.  GYNECOLOGIC HISTORY:  No LMP recorded. Patient is postmenopausal.     ADVANCED DIRECTIVES:    HEALTH MAINTENANCE: Social History  Substance Use Topics  . Smoking status: Former Smoker    Quit date: 10/21/1997  . Smokeless tobacco: Never Used  . Alcohol Use: No     Colonoscopy:  PAP:  Bone density:  Lipid panel:  Allergies  Allergen Reactions  . Amoxicillin-Pot Clavulanate Hives and Diarrhea  . Fentanyl Itching  . Codeine Nausea And Vomiting and Other (See Comments)    Other Reaction: "very ill"  . Latex Rash  . Other Rash    Current Outpatient Prescriptions  Medication Sig Dispense Refill  . albuterol (PROAIR HFA) 108 (90 BASE) MCG/ACT inhaler Inhale into the lungs.    . ALPRAZolam (XANAX) 0.5 MG tablet Take 0.5 mg by mouth once a week.     Marland Kitchen aspirin 81 MG tablet Take 81 mg by mouth daily.     Marland Kitchen bismuth subsalicylate (KAOPECTATE) 262 MG/15ML suspension Take 30 mLs by mouth 4 (four) times daily. 360 mL 0  . cyclobenzaprine (FLEXERIL) 5 MG tablet Take 5 mg by mouth daily as needed for muscle spasms.     . dabigatran (PRADAXA) 150 MG CAPS capsule TAKE ONE CAPSULE BY MOUTH TWICE DAILY    . dicyclomine (BENTYL) 10 MG capsule Take 10 mg by mouth 3 (three) times daily as needed (abdominal pain).     . fentaNYL (DURAGESIC - DOSED MCG/HR) 12 MCG/HR Place 1 patch (12.5 mcg total) onto the skin every 3 (three) days. 10 patch 0  . furosemide (LASIX) 40 MG tablet Take 40 mg by mouth 2 (two) times daily.     Marland Kitchen gabapentin (NEURONTIN) 100 MG capsule Take 100 mg by mouth every morning.     Marland Kitchen  glipiZIDE (GLUCOTROL) 10 MG tablet Take 10 mg by mouth 2 (two) times daily before a meal.   0  . insulin lispro (HUMALOG) 100 UNIT/ML KiwkPen as per sliding scale< than 150-- No insulin150-200---4 units 201-250---6 units251-300--- 8 units301-350----10 units.351-400---12 units    . isosorbide mononitrate (IMDUR) 60 MG 24 hr tablet Take 60 mg by mouth daily.     Marland Kitchen levothyroxine (SYNTHROID, LEVOTHROID) 25 MCG tablet Take 25 mcg by mouth daily before breakfast.     . lidocaine-prilocaine (EMLA) cream Apply 1 application topically as needed. 30 g 3  . Liraglutide 18 MG/3ML SOPN Inject 3 mLs into the skin daily.     Marland Kitchen losartan (COZAAR) 100 MG tablet Take 100 mg by mouth daily.     . magic mouthwash w/lidocaine SOLN Take 5 mLs by mouth 4 (four) times daily as needed for mouth pain. 480 mL 3  . Magnesium-Calcium-Folic Acid 314-970-2 MG TABS Take 1 tablet by mouth daily.     . metFORMIN (GLUCOPHAGE) 1000 MG tablet Take 1,000 mg by mouth 2 (two) times daily.     . metoprolol-hydrochlorothiazide (LOPRESSOR HCT) 50-25 MG per tablet Take 1 tablet by mouth daily.     Marland Kitchen  metroNIDAZOLE (FLAGYL) 500 MG tablet Take 1 tablet (500 mg total) by mouth 3 (three) times daily. 21 tablet 0  . nitroGLYCERIN (NITROSTAT) 0.4 MG SL tablet Place 0.4 mg under the tongue every 5 (five) minutes as needed for chest pain.     Marland Kitchen ondansetron (ZOFRAN) 8 MG tablet Take 1 tablet (8 mg total) by mouth 2 (two) times daily. Start the day after chemo for 3 days. Then take as needed for nausea or vomiting. 30 tablet 2  . oxyCODONE (OXY IR/ROXICODONE) 5 MG immediate release tablet Take 1 tablet (5 mg total) by mouth every 4 (four) hours as needed for severe pain. 30 tablet 0  . potassium chloride (K-DUR) 10 MEQ tablet Take 2 tablets (20 mEq total) by mouth daily. 60 tablet 0  . promethazine (PHENERGAN) 25 MG tablet TAKE ONE TABLET BY MOUTH EVERY SIX HOURSAS NEEDED FOR NAUSEA OR VOMITING 30 tablet 0  . RABEprazole (ACIPHEX) 20 MG tablet Take 20 mg  by mouth daily.     . simvastatin (ZOCOR) 20 MG tablet Take 20 mg by mouth every evening.     . clindamycin (CLEOCIN) 150 MG capsule Take 1 capsule (150 mg total) by mouth 3 (three) times daily. 30 capsule 0   Current Facility-Administered Medications  Medication Dose Route Frequency Provider Last Rate Last Dose  . sodium chloride 0.9 % 250 mL with potassium chloride 40 mEq, magnesium sulfate 4 g infusion   Intravenous Continuous Evlyn Kanner, NP   Stopped at 02/15/15 1343   Facility-Administered Medications Ordered in Other Visits  Medication Dose Route Frequency Provider Last Rate Last Dose  . heparin lock flush 100 unit/mL  500 Units Intravenous Once Forest Gleason, MD   500 Units at 11/20/14 1116  . sodium chloride 0.9 % injection 10 mL  10 mL Intravenous PRN Forest Gleason, MD   10 mL at 11/20/14 0916  . sodium chloride 0.9 % injection 10 mL  10 mL Intravenous PRN Forest Gleason, MD   10 mL at 01/21/15 0915  . sodium chloride 0.9 % injection 10 mL  10 mL Intravenous PRN Forest Gleason, MD   10 mL at 02/15/15 0955    OBJECTIVE: BP 108/65 mmHg  Pulse 71  Temp(Src) 96.9 F (36.1 C) (Tympanic)  Resp 20  Wt 163 lb (73.936 kg)   Body mass index is 28.88 kg/(m^2).    ECOG FS:3 - Symptomatic, >50% confined to bed  General: NAD at this time, sitting in wheelchair. Eyes: Pink conjunctiva, anicteric sclera. HEENT: Normocephalic, moist mucous membranes, clear oropharnyx. Lungs: Diminished air entry bilaterally. Heart: Regular rate and rhythm. No rubs, murmurs, or gallops. Abdomen: Soft, nondistended. Palpable liver, tenderness left upper quadrant. Musculoskeletal: No edema, cyanosis, or clubbing. Neuro: Alert, answering all questions appropriately. Cranial nerves grossly intact. Skin: No rashes or petechiae noted. Psych: Normal affect.   LAB RESULTS:  Infusion on 02/15/2015  Component Date Value Ref Range Status  . Sodium 02/15/2015 136  135 - 145 mmol/L Final  . Potassium 02/15/2015  2.6* 3.5 - 5.1 mmol/L Final   Comment: RESULTS VERIFIED BY REPEAT TESTING CRITICAL RESULT CALLED TO, READ BACK BY AND VERIFIED WITH NYO TO LORI JONES 1044 02/15/15   . Chloride 02/15/2015 104  101 - 111 mmol/L Final  . CO2 02/15/2015 24  22 - 32 mmol/L Final  . Glucose, Bld 02/15/2015 218* 65 - 99 mg/dL Final  . BUN 02/15/2015 9  6 - 20 mg/dL Final  . Creatinine, Ser 02/15/2015 0.92  0.44 - 1.00 mg/dL Final  . Calcium 02/15/2015 7.8* 8.9 - 10.3 mg/dL Final  . GFR calc non Af Amer 02/15/2015 58* >60 mL/min Final  . GFR calc Af Amer 02/15/2015 >60  >60 mL/min Final   Comment: (NOTE) The eGFR has been calculated using the CKD EPI equation. This calculation has not been validated in all clinical situations. eGFR's persistently <60 mL/min signify possible Chronic Kidney Disease.   . Anion gap 02/15/2015 8  5 - 15 Final  . Magnesium 02/15/2015 1.3* 1.7 - 2.4 mg/dL Final  Hospital Outpatient Visit on 02/14/2015  Component Date Value Ref Range Status  . Fluid Type-FCT 02/14/2015 CYTO PLEU   Final  . Color, Fluid 02/14/2015 YELLOW  YELLOW Final  . Appearance, Fluid 02/14/2015 TURBID* CLEAR Final  . WBC, Fluid 02/14/2015 478   Final  . Neutrophil Count, Fluid 02/14/2015 3   Final  . Lymphs, Fluid 02/14/2015 2   Final  . Monocyte-Macrophage-Serous Fluid 02/14/2015 95   Final  . Eos, Fluid 02/14/2015 0   Final  . Other Cells, Fluid 02/14/2015 0   Final  . Specimen Description 02/14/2015 CYTO PLEU   Final  . Special Requests 02/14/2015 NONE   Final  . Gram Stain 02/14/2015 PENDING   Incomplete  . Culture 02/14/2015 NO GROWTH < 24 HOURS   Final  . Report Status 02/14/2015 PENDING   Incomplete  Appointment on 02/14/2015  Component Date Value Ref Range Status  . WBC 02/14/2015 1.8* 3.6 - 11.0 K/uL Final  . RBC 02/14/2015 3.22* 3.80 - 5.20 MIL/uL Final  . Hemoglobin 02/14/2015 9.5* 12.0 - 16.0 g/dL Final  . HCT 02/14/2015 29.6* 35.0 - 47.0 % Final  . MCV 02/14/2015 91.9  80.0 - 100.0 fL  Final  . MCH 02/14/2015 29.5  26.0 - 34.0 pg Final  . MCHC 02/14/2015 32.1  32.0 - 36.0 g/dL Final  . RDW 02/14/2015 26.8* 11.5 - 14.5 % Final  . Platelets 02/14/2015 63* 150 - 440 K/uL Final  . Neutrophils Relative % 02/14/2015 75   Final  . Neutro Abs 02/14/2015 1.3* 1.4 - 6.5 K/uL Final  . Lymphocytes Relative 02/14/2015 18   Final  . Lymphs Abs 02/14/2015 0.3* 1.0 - 3.6 K/uL Final  . Monocytes Relative 02/14/2015 7   Final  . Monocytes Absolute 02/14/2015 0.1* 0.2 - 0.9 K/uL Final  . Eosinophils Relative 02/14/2015 0   Final  . Eosinophils Absolute 02/14/2015 0.0  0 - 0.7 K/uL Final  . Basophils Relative 02/14/2015 0   Final  . Basophils Absolute 02/14/2015 0.0  0 - 0.1 K/uL Final  . Sodium 02/14/2015 134* 135 - 145 mmol/L Final  . Potassium 02/14/2015 3.3* 3.5 - 5.1 mmol/L Final  . Chloride 02/14/2015 110  101 - 111 mmol/L Final  . CO2 02/14/2015 20* 22 - 32 mmol/L Final  . Glucose, Bld 02/14/2015 210* 65 - 99 mg/dL Final  . BUN 02/14/2015 11  6 - 20 mg/dL Final  . Creatinine, Ser 02/14/2015 0.68  0.44 - 1.00 mg/dL Final  . Calcium 02/14/2015 7.7* 8.9 - 10.3 mg/dL Final  . GFR calc non Af Amer 02/14/2015 >60  >60 mL/min Final  . GFR calc Af Amer 02/14/2015 >60  >60 mL/min Final   Comment: (NOTE) The eGFR has been calculated using the CKD EPI equation. This calculation has not been validated in all clinical situations. eGFR's persistently <60 mL/min signify possible Chronic Kidney Disease.   . Anion gap 02/14/2015 4* 5 - 15  Final  . Magnesium 02/14/2015 1.9  1.7 - 2.4 mg/dL Final  Appointment on 02/14/2015  Component Date Value Ref Range Status  . Specimen Description 02/14/2015 STOOL   Final  . Special Requests 02/14/2015 NONE   Final  . Culture 02/14/2015    Final                   Value:NO SALMONELLA OR SHIGELLA ISOLATED No Pathogenic E. coli detected CAMPYLOBACTER DETECTED CRITICAL RESULT CALLED TO, READ BACK BY AND VERIFIED WITH: BRENDA ELLINGTON,RN 02/15/2015 1117  BY JRS   . Report Status 02/14/2015 PENDING   Incomplete  . C Diff antigen 02/14/2015 NEGATIVE  NEGATIVE Final  . C Diff toxin 02/14/2015 NEGATIVE  NEGATIVE Final  . C Diff interpretation 02/14/2015 Negative for C. difficile   Final  Infusion on 02/13/2015  Component Date Value Ref Range Status  . WBC 02/13/2015 2.0* 3.6 - 11.0 K/uL Final  . RBC 02/13/2015 3.07* 3.80 - 5.20 MIL/uL Final  . Hemoglobin 02/13/2015 9.0* 12.0 - 16.0 g/dL Final  . HCT 02/13/2015 27.8* 35.0 - 47.0 % Final  . MCV 02/13/2015 90.6  80.0 - 100.0 fL Final  . MCH 02/13/2015 29.5  26.0 - 34.0 pg Final  . MCHC 02/13/2015 32.5  32.0 - 36.0 g/dL Final  . RDW 02/13/2015 27.0* 11.5 - 14.5 % Final  . Platelets 02/13/2015 60* 150 - 440 K/uL Final  . Neutrophils Relative % 02/13/2015 47   Final  . Neutro Abs 02/13/2015 0.9* 1.4 - 6.5 K/uL Final  . Lymphocytes Relative 02/13/2015 39   Final  . Lymphs Abs 02/13/2015 0.8* 1.0 - 3.6 K/uL Final  . Monocytes Relative 02/13/2015 7   Final  . Monocytes Absolute 02/13/2015 0.1* 0.2 - 0.9 K/uL Final  . Eosinophils Relative 02/13/2015 7   Final  . Eosinophils Absolute 02/13/2015 0.1  0 - 0.7 K/uL Final  . Basophils Relative 02/13/2015 0   Final  . Basophils Absolute 02/13/2015 0.0  0 - 0.1 K/uL Final  . Magnesium 02/13/2015 1.6* 1.7 - 2.4 mg/dL Final  . Sodium 02/13/2015 136  135 - 145 mmol/L Final  . Potassium 02/13/2015 3.1* 3.5 - 5.1 mmol/L Final  . Chloride 02/13/2015 111  101 - 111 mmol/L Final  . CO2 02/13/2015 21* 22 - 32 mmol/L Final  . Glucose, Bld 02/13/2015 150* 65 - 99 mg/dL Final  . BUN 02/13/2015 11  6 - 20 mg/dL Final  . Creatinine, Ser 02/13/2015 0.77  0.44 - 1.00 mg/dL Final  . Calcium 02/13/2015 7.8* 8.9 - 10.3 mg/dL Final  . GFR calc non Af Amer 02/13/2015 >60  >60 mL/min Final  . GFR calc Af Amer 02/13/2015 >60  >60 mL/min Final   Comment: (NOTE) The eGFR has been calculated using the CKD EPI equation. This calculation has not been validated in all  clinical situations. eGFR's persistently <60 mL/min signify possible Chronic Kidney Disease.   . Anion gap 02/13/2015 4* 5 - 15 Final    STUDIES: Dg Chest 1 View  02/14/2015   CLINICAL DATA:  Post right thoracentesis.  EXAM: CHEST 1 VIEW  COMPARISON:  Chest CT 02/14/2015  FINDINGS: Stable enlargement of the cardiac silhouette with a dual chamber cardiac pacemaker and previous median sternotomy. Port-A-Cath tip in the SVC region. Mild blunting of the right costophrenic angle suggestive for residual fluid or atelectasis. There is no evidence for pneumothorax. Vague opacities in the right upper lung compatible with known disease from the recent chest CT.  Hazy densities at left lung base compatible with pleural fluid and atelectasis. Trachea is midline.  IMPRESSION: Negative for pneumothorax following the right thoracentesis.  Vague parenchymal densities in the right upper lung compatible with recent CT findings.  Cardiomegaly.  Left pleural fluid with left basilar atelectasis.   Electronically Signed   By: Markus Daft M.D.   On: 02/14/2015 16:43   Ct Angio Chest Pe W/cm &/or Wo Cm  02/14/2015   CLINICAL DATA:  Extreme shortness of breath, history of lung cancer and liver cancer, on chemotherapy now.  EXAM: CT ANGIOGRAPHY CHEST WITH CONTRAST  TECHNIQUE: Multidetector CT imaging of the chest was performed using the standard protocol during bolus administration of intravenous contrast. Multiplanar CT image reconstructions and MIPs were obtained to evaluate the vascular anatomy.  CONTRAST:  11m OMNIPAQUE IOHEXOL 350 MG/ML SOLN  COMPARISON:  CT chest dated 01/18/2015.  FINDINGS: There is no pulmonary embolism identified within the main, lobar, or central segmental pulmonary arteries bilaterally. Some of the most peripheral segmental and subsegmental pulmonary arteries are difficult to definitively characterize due to mild patient breathing motion artifact.  Heart is enlarged, similar to the previous noncontrast  chest CT of 01/18/2015. Coronary artery calcifications noted. Patient is status post surgical changes of median sternotomy and CABG.  Atherosclerotic changes are again seen along the walls of the normal-caliber thoracic aorta. The small and moderately enlarged lymph nodes within the mediastinum are not significantly changed in the short-term interval.  There is a new left pleural effusion which is moderate in size. The moderate- sized right pleural effusion has slightly increased in the interval. Associated mild compressive atelectasis noted at each lung base.  The ill-defined consolidation within the right upper lobe is unchanged, however, there are new ground-glass opacities and areas of interstitial thickening throughout the right upper lobe and right lower lobe. Milder ground-glass opacities and interstitial thickening noted on the left. There is also increased peribronchial thickening throughout both lungs bilaterally.  Moderate- sized hiatal hernia again noted. Limited images of the upper abdomen are otherwise unremarkable. Degenerative changes are again seen throughout the thoracolumbar spine without acute osseous abnormality. Stable 3.3 cm density in the left lateral breast is unchanged and likely related to previous lumpectomy.  Review of the MIP images confirms the above findings.  IMPRESSION: 1. No pulmonary embolism, with mild study limitations detailed above. 2. Moderate-sized right pleural effusion which is slightly enlarged in the interval. 3. New left-sided pleural effusion which is also moderate in size, slightly smaller than the right pleural effusion. 4. Stable dense consolidation within the right upper lobe. 5. New ground-glass opacities and interstitial thickening within both lungs, right greater than left, with additional bilateral peribronchial thickening, most likely representing developing congestive heart failure with pulmonary edema. Recommend follow-up chest CT at some point to ensure  resolution, to exclude the less likely possibility of this representing lymphangitic carcinomatosis. Of note, these new ground-glass opacities and interstitial thickening obscure the previously noted pulmonary nodules described on CT of 01/18/2015. These results were called by telephone at the time of interpretation on 02/14/2015 at 1:35 pm to Dr. LGeorgeanne Nim, who verbally acknowledged these results.   Electronically Signed   By: SFranki CabotM.D.   On: 02/14/2015 13:43   UKoreaThoracentesis Asp Pleural Space W/img Guide  02/14/2015   CLINICAL DATA:  History of lung cancer with pleural fluid.  EXAM: ULTRASOUND GUIDED RIGHT THORACENTESIS  COMPARISON:  Chest CT 02/14/2015  PROCEDURE: An ultrasound guided thoracentesis was thoroughly  discussed with the patient and questions answered. The benefits, risks, alternatives and complications were also discussed. The patient understands and wishes to proceed with the procedure. Written consent was obtained.  Ultrasound was performed to localize and mark an adequate pocket of fluid in the right posterior chest. The area was then prepped and draped in the normal sterile fashion. 1% Lidocaine was used for local anesthesia. Under ultrasound guidance a Safe-T-Centesis catheter was introduced. Thoracentesis was performed. The catheter was removed and a dressing applied.  COMPLICATIONS: None.  FINDINGS: A total of approximately 600 mL of clear amber colored fluid was removed. A fluid sample wassent for laboratory analysis.  IMPRESSION: Successful ultrasound guided right thoracentesis yielding 600 mL of pleural fluid.   Electronically Signed   By: Markus Daft M.D.   On: 02/14/2015 16:39    ASSESSMENT:  Right upper lobe carcinoma of lung. Acute shortness of breath. Pulmonary edema. Diarrhea.  PLAN:   1. Lung CA. Consistent with small cell undifferentiated lung cancer, stage IV disease. Patient has been under treatment with carboplatin and CPT-11. She received cycle 1 day 1  on 01/28/2015. 2. Acute shortness of breath. Patient desats to 83% with just 4-5 steps on room air. Home oxygen referral has been sent. Patient was sent for CT scan to evaluate for PE. Report was called for radiology and there is no pulmonary embolism identified. However, patient is noted to have moderate sized right pleural effusion which is enlarged and interval as well as new left-sided pleural effusion which is also moderate in size. CT scan also describes stable dense consolidation within the right upper lobe as well as new groundglass opacities and interstitial thickening within both lungs right greater than left, these findings most likely suggestive of congestive heart failure with pulmonary edema. 3. Pulmonary edema secondary to CHF. Will send patient for ultrasound-guided thoracentesis of right pleural effusion. Pleural fluid will be sent for cell count as well as cytology. Contacted patient's cardiologist, Dr. Josefa Half, to set up follow-up for patient. Cardiology receptionist requested patient to call for follow-up appointment, patient made aware that she would be to call their office. Advised patient to start Lasix 40 mg once daily as well as potassium 20 mEq twice daily. 4. Diarrhea. CDiff reported as negative. Stool cultures are pending. Patient has received injection of Sandostatin LAR yesterday on October 5. Advised patient to continue with Kaopectate as needed.  Patient instructed to keep follow-up appointment that was previously scheduled for tomorrow.  Patient expressed understanding and was in agreement with this plan. She also understands that She can call clinic at any time with any questions, concerns, or complaints.   Dr.Finnegan was available for consultation and review of plan of care for this patient.  Cancer of upper lobe of right lung [facility])   Staging form: Lung, AJCC 7th Edition     Clinical: T2, N2, M1 - Signed by Forest Gleason, MD on 11/03/2014   Evlyn Kanner, NP    02/15/2015 2:31 PM

## 2015-02-17 ENCOUNTER — Encounter: Payer: Self-pay | Admitting: Oncology

## 2015-02-17 LAB — STOOL CULTURE

## 2015-02-17 NOTE — Progress Notes (Signed)
[place] @ [facility] [phone]) [phone]  [phone]     Wendy Giles OB: Dec 22, 1932  MR#: [id number]  BMW#:[id number]  Patient Care Team: Tracie Harrier, MD as PCP - General (Internal Medicine) Isaias Cowman, MD as Consulting Physician (Cardiology)  CHIEF COMPLAINT:  Chief Complaint  Patient presents with  . Diarrhea       Carcinoma of breast                                AJCC Staging:  [id number]_                               Stage Grouping: I                               Cancer Status: Evidence of disease.                               Estrogen receptor positive and                               progesterone receptor positive. HER- 2                               Negative. 2.  Abnormal CT scan of the chest (June, 2016) Oncology History   1.  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  Previous history of carcinoma breast stage I disease 3.  Started on chemotherapy with carboplatinum and VP-16 from November 20, 2014 MRI scan of brain was negative (July, 2016)   Patient was started on carboplatinum and VP-16 which has been discontinued in September of 2016 (received total of 3 cycles of chemotherapy) CT scan did not see any significant response 4, patient will be started on carboplatinum and CPT-11 from January 28, 2015   Oncology Flowsheet 01/01/2015 01/02/2015 01/28/2015 02/04/2015 02/10/2015 02/12/2015 02/13/2015  Day, Cycle Day 2, Cycle 3 Day 3, Cycle 3 Day 1, Cycle 1 Day 8, Cycle 1 - - -  CARBOplatin (PARAPLATIN) IV - - 350 mg - - - -  dexamethasone (DECADRON) IV [ 10 mg ] [ 10 mg ] [ 12 mg ] [ 10 mg ] - - -  etoposide (VEPESID) IV 80 mg/m2 80 mg/m2 - - - - -  fosaprepitant (EMEND) IV - - [ 150 mg ] - - - -  irinotecan (CAMPTOSAR) IV - - 65 mg/m2 65 mg/m2 - - -  octreotide (SANDOSTATIN LAR) IM - - - - -  - 20 mg  octreotide (SANDOSTATIN) [place] - - - - - 200 mcg -  ondansetron (ZOFRAN) IV [ 8 mg ] [ 8 mg ] - [ 16 mg ] 4 mg - -  palonosetron (ALOXI) IV - - 0.25 mg - - - -  pegfilgrastim (NEULASTA ONPRO KIT) [place] - 6 mg - - - - -    INTERVAL HISTORY: 81 year old lady with 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  Started having diarrhea 2 days ago several stool.  No nausea no vomiting.  Diarrhea is watery.  No blood.  No mucus.  No chills.  No fever.  Feeling week tired and exhausted. Condition did not respond to Imodium later on Lomotil was tried but patient continued to have diarrhea..  February 13, 2015 Patient continues to have  Diarrhea..several loose stool yesterday .Feeling weak and tired.  Mucous membranes are dry. No abdominal pain no hematuria and no blood in the stool.  No vomiting. For overall intake REVIEW OF SYSTEMS:    general status: Patient is feeling weak and tired.  No change in a performance status.  No chills.  No fever. HEENT: Alopecia.  No evidence of stomatitis Chest membranes are dry Lungs: No cough or shortness of breath Cardiac: No chest pain or paroxysmal nocturnal dyspnea GI: patient has diarrhea several times.  Loose.  No blood. Skin: Poor skin turgor Lower extremity no swelling Neurological system: No tingling.  No numbness.  No other focal signs Musculoskeletal system no bony pains  Genitourinary system: No dysuria.  No hematuria.  No frequency.  No flank pain. Neurological system: No dizziness.  No tingling.  His was.  no tingling numbness.  no focal weakness or any focal signs. Depression has improved ve.  PAST MEDICAL HISTORY: Past Medical History  Diagnosis Date  . CHF (congestive heart failure) ([place])   . Diabetes mellitus without complication ([place])   . Hypertension   . Presence of permanent cardiac pacemaker   . Dysrhythmia     afib  . Depression   . GERD (gastroesophageal reflux disease)   . Coronary  artery disease   . Cancer ([place])     breast,uterine  . COPD (chronic obstructive pulmonary disease) ([place])   . Arthritis   . Anemia   . Anginal pain ([place])   . Cancer of upper lobe of right lung ([place]) 11/03/2014  . Breast cancer [facility])       Surgical History            Diabetes controlled with oral medication                               Previous history of congestive heart                               failure                               Coronary artery disease with stent                               placement and bypass surgery Hypertension                               Osteoarthritis                               Colon polyps                               Chronic low back pain  Past Surgical History:                               Coronary bypass surgery and stent                               placement                                Pacemaker because of atrial fibrillation                               Appendectomy                               Hemorrhoidectomy                                                              Family History:                               Family history of breast cancer and colon                               cancer. History of anemia, diabetes, heart                               disease, and hypertension in the family.                                                              Social History:                               Does not smoke now. Does not drink. Used                               to smoke in the pa ADVANCED DIRECTIVES:  No flowsheet data found.  HEALTH MAINTENANCE: Social History  Substance Use Topics  . Smoking status: Former Smoker    Quit date: 10/21/1997  . Smokeless tobacco: Never Used  . Alcohol Use: No      Allergies  Allergen Reactions  . Amoxicillin-Pot Clavulanate Hives and Diarrhea  . Fentanyl Itching  . Codeine Nausea And Vomiting and Other (See  Comments)    Other Reaction: "very ill"  . Latex Rash  . Other Rash    Current Outpatient Prescriptions  Medication Sig Dispense Refill  . albuterol (PROAIR HFA) 108 (90 BASE) MCG/ACT inhaler Inhale into the lungs.    . ALPRAZolam (XANAX) 0.5 MG tablet Take 0.5 mg by mouth once  a week.     Marland Kitchen aspirin 81 MG tablet Take 81 mg by mouth daily.     Marland Kitchen bismuth subsalicylate (KAOPECTATE) 262 MG/15ML suspension Take 30 mLs by mouth 4 (four) times daily. 360 mL 0  . cyclobenzaprine (FLEXERIL) 5 MG tablet Take 5 mg by mouth daily as needed for muscle spasms.     . dabigatran (PRADAXA) 150 MG CAPS capsule TAKE ONE CAPSULE BY MOUTH TWICE DAILY    . dicyclomine (BENTYL) 10 MG capsule Take 10 mg by mouth 3 (three) times daily as needed (abdominal pain).     . fentaNYL (DURAGESIC - DOSED MCG/HR) 12 MCG/HR Place 1 patch (12.5 mcg total) onto the skin every 3 (three) days. 10 patch 0  . furosemide (LASIX) 40 MG tablet Take 40 mg by mouth 2 (two) times daily.     Marland Kitchen gabapentin (NEURONTIN) 100 MG capsule Take 100 mg by mouth every morning.     Marland Kitchen glipiZIDE (GLUCOTROL) 10 MG tablet Take 10 mg by mouth 2 (two) times daily before a meal.   0  . insulin lispro (HUMALOG) 100 UNIT/ML KiwkPen as per sliding scale< than 150-- No insulin150-200---4 units 201-250---6 units251-300--- 8 units301-350----10 units.351-400---12 units    . isosorbide mononitrate (IMDUR) 60 MG 24 hr tablet Take 60 mg by mouth daily.     Marland Kitchen levothyroxine (SYNTHROID, LEVOTHROID) 25 MCG tablet Take 25 mcg by mouth daily before breakfast.     . lidocaine-prilocaine (EMLA) cream Apply 1 application topically as needed. 30 g 3  . Liraglutide 18 MG/3ML SOPN Inject 3 mLs into the skin daily.     Marland Kitchen losartan (COZAAR) 100 MG tablet Take 100 mg by mouth daily.     . magic mouthwash w/lidocaine SOLN Take 5 mLs by mouth 4 (four) times daily as needed for mouth pain. 480 mL 3  . Magnesium-Calcium-Folic Acid 161-096-0 MG TABS Take 1 tablet by mouth daily.     .  metFORMIN (GLUCOPHAGE) 1000 MG tablet Take 1,000 mg by mouth 2 (two) times daily.     . metoprolol-hydrochlorothiazide (LOPRESSOR HCT) 50-25 MG per tablet Take 1 tablet by mouth daily.     . metroNIDAZOLE (FLAGYL) 500 MG tablet Take 1 tablet (500 mg total) by mouth 3 (three) times daily. 21 tablet 0  . nitroGLYCERIN (NITROSTAT) 0.4 MG SL tablet Place 0.4 mg under the tongue every 5 (five) minutes as needed for chest pain.     Marland Kitchen ondansetron (ZOFRAN) 8 MG tablet Take 1 tablet (8 mg total) by mouth 2 (two) times daily. Start the day after chemo for 3 days. Then take as needed for nausea or vomiting. 30 tablet 2  . oxyCODONE (OXY IR/ROXICODONE) 5 MG immediate release tablet Take 1 tablet (5 mg total) by mouth every 4 (four) hours as needed for severe pain. 30 tablet 0  . promethazine (PHENERGAN) 25 MG tablet TAKE ONE TABLET BY MOUTH EVERY SIX HOURSAS NEEDED FOR NAUSEA OR VOMITING 30 tablet 0  . RABEprazole (ACIPHEX) 20 MG tablet Take 20 mg by mouth daily.     . simvastatin (ZOCOR) 20 MG tablet Take 20 mg by mouth every evening.     . clindamycin (CLEOCIN) 150 MG capsule Take 1 capsule (150 mg total) by mouth 3 (three) times daily. 30 capsule 0  . potassium chloride (K-DUR) 10 MEQ tablet Take 2 tablets (20 mEq total) by mouth daily. 60 tablet 0   No current facility-administered medications for this visit.   Facility-Administered Medications Ordered in Other  Visits  Medication Dose Route Frequency Provider Last Rate Last Dose  . heparin lock flush 100 unit/mL  500 Units Intravenous Once Forest Gleason, MD   500 Units at 11/20/14 1116  . sodium chloride 0.9 % injection 10 mL  10 mL Intravenous PRN Forest Gleason, MD   10 mL at 11/20/14 0916  . sodium chloride 0.9 % injection 10 mL  10 mL Intravenous PRN Forest Gleason, MD   10 mL at 01/21/15 0915    OBJECTIVE:  Filed Vitals:   02/13/15 0844  BP: 146/80  Pulse: 79  Temp: 97.3 F (36.3 C)     Body mass index is 30 kg/(m^2).    ECOG FS:1 - Symptomatic  but completely ambulatory  PHYSICAL EXAM: General  status: Performance status is good.  Patient said clinical signs of dehydration  Skin turgor is poor.  Mucous membranes are dry. HEENT: No evidence of stomatitis. Sclera and conjunctivae :: No jaundice.   pale looking. Lungs: Air  entry equal on both sides.  No rhonchi.  No rales.  Cardiac: Heart sounds are normal.  No pericardial rub.  No murmur. There is some swelling above port.  Soft.  No fluid.  No redness or tenderness. Lymphatic system: Cervical, axillary, inguinal, lymph nodes not palpable GI: Abdomen is soft.  No ascites.  Liver liver is palpable.  Tenderness in left upper quadrant   No tenderness.  Bowel sounds are within normal limit Lower extremity: No edema Neurological system: Higher functions, cranial nerves intact no evidence of peripheral neuropathy.  Examination of both breasts.  Patient had down previous history of carcinoma of left breast no palpable masses axillary lymph nodes are normal.  Right breast free of masses.    LAB RESULTS:  All lab data has been reviewed WBC 1.8 PLATELETS  63,000  POTASSIUM WAS 3.3 ASSESSMENT:  Acute problems secondary to diarrhea  Patient has mild dehydration Diarrhea is secondary to chemotherapy 1.  Right upper lobe carcinoma of lung.  Biopsy is positive for small cell undifferentiated tumor CT scan of the abdomen shows multiple liver metastases Stage IV disease   MEDICAL DECISION MAKING:   Dehydration secondary to diarrhea Electrolyte imbalance Proceed with IV fluid Replace potassium Sandostatin LAR   Stool for C. Difficile and culture was negative  No matching staging information was found for the patient.  Forest Gleason, MD   02/17/2015 3:28 PM

## 2015-02-18 ENCOUNTER — Other Ambulatory Visit: Payer: Self-pay | Admitting: Oncology

## 2015-02-18 ENCOUNTER — Ambulatory Visit
Admission: RE | Admit: 2015-02-18 | Discharge: 2015-02-18 | Disposition: A | Discharge status: Home / Self Care | Payer: Medicare Other | Source: Ambulatory Visit | Attending: Oncology | Admitting: Oncology

## 2015-02-18 ENCOUNTER — Other Ambulatory Visit: Payer: Self-pay | Admitting: *Deleted

## 2015-02-18 DIAGNOSIS — J90 Pleural effusion, not elsewhere classified: Secondary | ICD-10-CM

## 2015-02-18 DIAGNOSIS — J948 Other specified pleural conditions: Secondary | ICD-10-CM | POA: Diagnosis not present

## 2015-02-18 DIAGNOSIS — C3411 Malignant neoplasm of upper lobe, right bronchus or lung: Secondary | ICD-10-CM

## 2015-02-18 LAB — BODY FLUID CULTURE

## 2015-02-19 LAB — CYTOLOGY - NON PAP

## 2015-02-21 ENCOUNTER — Inpatient Hospital Stay (HOSPITAL_BASED_OUTPATIENT_CLINIC_OR_DEPARTMENT_OTHER): Payer: Medicare Other | Admitting: Oncology

## 2015-02-21 ENCOUNTER — Encounter: Payer: Self-pay | Admitting: Oncology

## 2015-02-21 ENCOUNTER — Inpatient Hospital Stay: Payer: Medicare Other

## 2015-02-21 VITALS — BP 112/75 | HR 80 | Temp 97.6°F | Wt 161.2 lb

## 2015-02-21 DIAGNOSIS — Z794 Long term (current) use of insulin: Secondary | ICD-10-CM

## 2015-02-21 DIAGNOSIS — Z853 Personal history of malignant neoplasm of breast: Secondary | ICD-10-CM

## 2015-02-21 DIAGNOSIS — M545 Low back pain: Secondary | ICD-10-CM

## 2015-02-21 DIAGNOSIS — R197 Diarrhea, unspecified: Secondary | ICD-10-CM | POA: Diagnosis not present

## 2015-02-21 DIAGNOSIS — T451X5A Adverse effect of antineoplastic and immunosuppressive drugs, initial encounter: Secondary | ICD-10-CM | POA: Diagnosis not present

## 2015-02-21 DIAGNOSIS — G8929 Other chronic pain: Secondary | ICD-10-CM

## 2015-02-21 DIAGNOSIS — R531 Weakness: Secondary | ICD-10-CM

## 2015-02-21 DIAGNOSIS — R5383 Other fatigue: Secondary | ICD-10-CM

## 2015-02-21 DIAGNOSIS — Z79899 Other long term (current) drug therapy: Secondary | ICD-10-CM

## 2015-02-21 DIAGNOSIS — J90 Pleural effusion, not elsewhere classified: Secondary | ICD-10-CM

## 2015-02-21 DIAGNOSIS — Z86010 Personal history of colonic polyps: Secondary | ICD-10-CM

## 2015-02-21 DIAGNOSIS — I509 Heart failure, unspecified: Secondary | ICD-10-CM

## 2015-02-21 DIAGNOSIS — E119 Type 2 diabetes mellitus without complications: Secondary | ICD-10-CM

## 2015-02-21 DIAGNOSIS — E878 Other disorders of electrolyte and fluid balance, not elsewhere classified: Secondary | ICD-10-CM

## 2015-02-21 DIAGNOSIS — Z7982 Long term (current) use of aspirin: Secondary | ICD-10-CM

## 2015-02-21 DIAGNOSIS — Z950 Presence of cardiac pacemaker: Secondary | ICD-10-CM

## 2015-02-21 DIAGNOSIS — R0602 Shortness of breath: Secondary | ICD-10-CM

## 2015-02-21 DIAGNOSIS — F329 Major depressive disorder, single episode, unspecified: Secondary | ICD-10-CM

## 2015-02-21 DIAGNOSIS — I209 Angina pectoris, unspecified: Secondary | ICD-10-CM

## 2015-02-21 DIAGNOSIS — E860 Dehydration: Secondary | ICD-10-CM

## 2015-02-21 DIAGNOSIS — C787 Secondary malignant neoplasm of liver and intrahepatic bile duct: Secondary | ICD-10-CM

## 2015-02-21 DIAGNOSIS — I10 Essential (primary) hypertension: Secondary | ICD-10-CM

## 2015-02-21 DIAGNOSIS — K219 Gastro-esophageal reflux disease without esophagitis: Secondary | ICD-10-CM

## 2015-02-21 DIAGNOSIS — C3411 Malignant neoplasm of upper lobe, right bronchus or lung: Secondary | ICD-10-CM

## 2015-02-21 DIAGNOSIS — Z23 Encounter for immunization: Secondary | ICD-10-CM

## 2015-02-21 DIAGNOSIS — E876 Hypokalemia: Secondary | ICD-10-CM

## 2015-02-21 DIAGNOSIS — I4891 Unspecified atrial fibrillation: Secondary | ICD-10-CM

## 2015-02-21 DIAGNOSIS — M129 Arthropathy, unspecified: Secondary | ICD-10-CM

## 2015-02-21 DIAGNOSIS — D649 Anemia, unspecified: Secondary | ICD-10-CM

## 2015-02-21 DIAGNOSIS — J811 Chronic pulmonary edema: Secondary | ICD-10-CM

## 2015-02-21 DIAGNOSIS — J449 Chronic obstructive pulmonary disease, unspecified: Secondary | ICD-10-CM

## 2015-02-21 DIAGNOSIS — M1990 Unspecified osteoarthritis, unspecified site: Secondary | ICD-10-CM

## 2015-02-21 DIAGNOSIS — Z87891 Personal history of nicotine dependence: Secondary | ICD-10-CM

## 2015-02-21 DIAGNOSIS — I2510 Atherosclerotic heart disease of native coronary artery without angina pectoris: Secondary | ICD-10-CM

## 2015-02-21 LAB — COMPREHENSIVE METABOLIC PANEL WITH GFR
ALT: 12 U/L — ABNORMAL LOW (ref 14–54)
AST: 26 U/L (ref 15–41)
Albumin: 3.7 g/dL (ref 3.5–5.0)
Alkaline Phosphatase: 182 U/L — ABNORMAL HIGH (ref 38–126)
Anion gap: 7 (ref 5–15)
BUN: 16 mg/dL (ref 6–20)
CO2: 29 mmol/L (ref 22–32)
Calcium: 7.9 mg/dL — ABNORMAL LOW (ref 8.9–10.3)
Chloride: 105 mmol/L (ref 101–111)
Creatinine, Ser: 0.91 mg/dL (ref 0.44–1.00)
GFR calc Af Amer: >60 mL/min
GFR calc non Af Amer: 58 mL/min — ABNORMAL LOW
Glucose, Bld: 105 mg/dL — ABNORMAL HIGH (ref 65–99)
Potassium: 2.8 mmol/L — CL (ref 3.5–5.1)
Sodium: 141 mmol/L (ref 135–145)
Total Bilirubin: 0.6 mg/dL (ref 0.3–1.2)
Total Protein: 5.6 g/dL — ABNORMAL LOW (ref 6.5–8.1)

## 2015-02-21 LAB — CBC WITH DIFFERENTIAL/PLATELET
BASOS PCT: 1 %
Basophils Absolute: 0 10*3/uL (ref 0–0.1)
Eosinophils Absolute: 0 10*3/uL (ref 0–0.7)
Eosinophils Relative: 0 %
HEMATOCRIT: 29.6 % — AB (ref 35.0–47.0)
HEMOGLOBIN: 9.9 g/dL — AB (ref 12.0–16.0)
Lymphocytes Relative: 27 %
Lymphs Abs: 0.8 10*3/uL — ABNORMAL LOW (ref 1.0–3.6)
MCH: 30.8 pg (ref 26.0–34.0)
MCHC: 33.3 g/dL (ref 32.0–36.0)
MCV: 92.4 fL (ref 80.0–100.0)
Monocytes Absolute: 0.5 10*3/uL (ref 0.2–0.9)
Monocytes Relative: 16 %
NEUTROS ABS: 1.7 10*3/uL (ref 1.4–6.5)
NEUTROS PCT: 56 %
Platelets: 248 10*3/uL (ref 150–440)
RBC: 3.2 MIL/uL — ABNORMAL LOW (ref 3.80–5.20)
RDW: 27.4 % — AB (ref 11.5–14.5)
WBC: 3.1 10*3/uL — AB (ref 3.6–11.0)

## 2015-02-21 LAB — MAGNESIUM: MAGNESIUM: 1.5 mg/dL — AB (ref 1.7–2.4)

## 2015-02-21 MED ORDER — SODIUM CHLORIDE 0.9 % IJ SOLN
10.0000 mL | Freq: Once | INTRAMUSCULAR | Status: AC
  Administered 2015-02-21: 10 mL via INTRAVENOUS
  Filled 2015-02-21: qty 10

## 2015-02-21 MED ORDER — SODIUM CHLORIDE 0.9 % IV SOLN
Freq: Once | INTRAVENOUS | Status: AC
  Administered 2015-02-21: 10:00:00 via INTRAVENOUS
  Filled 2015-02-21: qty 500

## 2015-02-21 MED ORDER — HEPARIN SOD (PORK) LOCK FLUSH 100 UNIT/ML IV SOLN
500.0000 [IU] | Freq: Once | INTRAVENOUS | Status: AC
  Administered 2015-02-21: 500 [IU] via INTRAVENOUS
  Filled 2015-02-21: qty 5

## 2015-02-21 NOTE — Progress Notes (Signed)
[place] @ [facility] Telephone:(336) [phone]  [phone]     Wendy Giles OB: Nov 15, 1932  MR#: [id number]  ZJI#:[id number]  Patient Care Team: Tracie Harrier, MD as PCP - General (Internal Medicine) Isaias Cowman, MD as Consulting Physician (Cardiology)  CHIEF COMPLAINT:  No chief complaint on file.      Carcinoma of breast                                AJCC Staging:  pT1N0M_                               Stage Grouping: I                               Cancer Status: Evidence of disease.                               Estrogen receptor positive and                               progesterone receptor positive. HER- 2                               Negative. 2.  Abnormal CT scan of the chest (June, 2016) Oncology History   1.  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  Previous history of carcinoma breast stage I disease 3.  Started on chemotherapy with carboplatinum and VP-16 from November 20, 2014 MRI scan of brain was negative (July, 2016)   Patient was started on carboplatinum and VP-16 which has been discontinued in September of 2016 (received total of 3 cycles of chemotherapy) CT scan did not see any significant response 4, patient will be started on carboplatinum and CPT-11 from January 28, 2015   Oncology Flowsheet 01/01/2015 01/02/2015 01/28/2015 02/04/2015 02/10/2015 02/12/2015 02/13/2015  Day, Cycle Day 2, Cycle 3 Day 3, Cycle 3 Day 1, Cycle 1 Day 8, Cycle 1 - - -  CARBOplatin (PARAPLATIN) IV - - 350 mg - - - -  dexamethasone (DECADRON) IV [ 10 mg ] [ 10 mg ] [ 12 mg ] [ 10 mg ] - - -  etoposide (VEPESID) IV 80 mg/m2 80 mg/m2 - - - - -  fosaprepitant (EMEND) IV - - [ 150 mg ] - - - -  irinotecan (CAMPTOSAR) IV - - 65 mg/m2 65 mg/m2 - - -  octreotide (SANDOSTATIN LAR) IM - - - - - - 20 mg    octreotide (SANDOSTATIN) [place] - - - - - 200 mcg -  ondansetron (ZOFRAN) IV [ 8 mg ] [ 8 mg ] - [ 16 mg ] 4 mg - -  palonosetron (ALOXI) IV - - 0.25 mg - - - -  pegfilgrastim (NEULASTA ONPRO KIT) [place] - 6 mg - - - - -    INTERVAL HISTORY: 81 year old lady with 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  Recently had shortness of breath had the pleural effusion thoracentesis was done and  cytology was positive for small cell undifferentiated carcinoma of lung Recent episode with diarrhea has resolved. Patient is hypokalemic.  Feeling weak.  Tired.  Her overall general condition is improving. Abdominal pain has improved on present pain medication   REVIEW OF SYSTEMS:    general status: Patient is feeling weak and tired.  No change in a performance status.  No chills.  No fever. HEENT: Alopecia.  No evidence of stomatitis Chest membranes are dry Lungs: No cough or shortness of breath Had thoracentesis and fluid was positive for small cell undifferentiated carcinoma of lung Cardiac: No chest pain or paroxysmal nocturnal dyspnea GI: Diarrhea has improved Skin: Poor skin turgor Lower extremity no swelling Neurological system: No tingling.  No numbness.  No other focal signs Musculoskeletal system no bony pains  Genitourinary system: No dysuria.  No hematuria.  No frequency.  No flank pain. Neurological system: No dizziness.  No tingling.  His was.  no tingling numbness.  no focal weakness or any focal signs. Depression has improved ve.  PAST MEDICAL HISTORY: Past Medical History  Diagnosis Date  . CHF (congestive heart failure) ([place])   . Diabetes mellitus without complication ([place])   . Hypertension   . Presence of permanent cardiac pacemaker   . Dysrhythmia     afib  . Depression   . GERD (gastroesophageal reflux disease)   . Coronary artery disease   . Cancer ([place])     breast,uterine  . COPD (chronic obstructive pulmonary disease) ([place])   . Arthritis   .  Anemia   . Anginal pain ([place])   . Cancer of upper lobe of right lung ([place]) 11/03/2014  . Breast cancer [facility])       Surgical History            Diabetes controlled with oral medication                               Previous history of congestive heart                               failure                               Coronary artery disease with stent                               placement and bypass surgery Hypertension                               Osteoarthritis                               Colon polyps                               Chronic low back pain  Past Surgical History:                               Coronary bypass surgery and stent                               placement                                Pacemaker because of atrial fibrillation                               Appendectomy                               Hemorrhoidectomy                                                              Family History:                               Family history of breast cancer and colon                               cancer. History of anemia, diabetes, heart                               disease, and hypertension in the family.                                                              Social History:                               Does not smoke now. Does not drink. Used                               to smoke in the pa ADVANCED DIRECTIVES:  No flowsheet data found.  HEALTH MAINTENANCE: Social History  Substance Use Topics  . Smoking status: Former Smoker    Quit date: 10/21/1997  . Smokeless tobacco: Never Used  . Alcohol Use: No      Allergies  Allergen Reactions  . Amoxicillin-Pot Clavulanate Hives and Diarrhea  . Fentanyl Itching  . Codeine Nausea And Vomiting and Other (See Comments)    Other Reaction: "very ill"  . Latex Rash  . Other Rash    Current Outpatient Prescriptions  Medication Sig  Dispense Refill  . albuterol (PROAIR HFA) 108 (90 BASE) MCG/ACT inhaler Inhale into the lungs.    . ALPRAZolam (XANAX) 0.5 MG tablet Take 0.5 mg by mouth once  a week.     Marland Kitchen aspirin 81 MG tablet Take 81 mg by mouth daily.     Marland Kitchen bismuth subsalicylate (KAOPECTATE) 262 MG/15ML suspension Take 30 mLs by mouth 4 (four) times daily. 360 mL 0  . clindamycin (CLEOCIN) 150 MG capsule Take 1 capsule (150 mg total) by mouth 3 (three) times daily. 30 capsule 0  . cyclobenzaprine (FLEXERIL) 5 MG tablet Take 5 mg by mouth daily as needed for muscle spasms.     . dabigatran (PRADAXA) 150 MG CAPS capsule TAKE ONE CAPSULE BY MOUTH TWICE DAILY    . dicyclomine (BENTYL) 10 MG capsule Take 10 mg by mouth 3 (three) times daily as needed (abdominal pain).     . fentaNYL (DURAGESIC - DOSED MCG/HR) 12 MCG/HR Place 1 patch (12.5 mcg total) onto the skin every 3 (three) days. 10 patch 0  . furosemide (LASIX) 40 MG tablet Take 40 mg by mouth 2 (two) times daily.     Marland Kitchen gabapentin (NEURONTIN) 100 MG capsule Take 100 mg by mouth every morning.     Marland Kitchen glipiZIDE (GLUCOTROL) 10 MG tablet Take 10 mg by mouth 2 (two) times daily before a meal.   0  . insulin lispro (HUMALOG) 100 UNIT/ML KiwkPen as per sliding scale< than 150-- No insulin150-200---4 units 201-250---6 units251-300--- 8 units301-350----10 units.351-400---12 units    . isosorbide mononitrate (IMDUR) 60 MG 24 hr tablet Take 60 mg by mouth daily.     Marland Kitchen levothyroxine (SYNTHROID, LEVOTHROID) 25 MCG tablet Take 25 mcg by mouth daily before breakfast.     . lidocaine-prilocaine (EMLA) cream Apply 1 application topically as needed. 30 g 3  . Liraglutide 18 MG/3ML SOPN Inject 3 mLs into the skin daily.     Marland Kitchen losartan (COZAAR) 100 MG tablet Take 100 mg by mouth daily.     . magic mouthwash w/lidocaine SOLN Take 5 mLs by mouth 4 (four) times daily as needed for mouth pain. 480 mL 3  . Magnesium-Calcium-Folic Acid 937-902-4 MG TABS Take 1 tablet by mouth daily.     . metFORMIN  (GLUCOPHAGE) 1000 MG tablet Take 1,000 mg by mouth 2 (two) times daily.     . metoprolol-hydrochlorothiazide (LOPRESSOR HCT) 50-25 MG per tablet Take 1 tablet by mouth daily.     . metroNIDAZOLE (FLAGYL) 500 MG tablet Take 1 tablet (500 mg total) by mouth 3 (three) times daily. 21 tablet 0  . nitroGLYCERIN (NITROSTAT) 0.4 MG SL tablet Place 0.4 mg under the tongue every 5 (five) minutes as needed for chest pain.     Marland Kitchen ondansetron (ZOFRAN) 8 MG tablet Take 1 tablet (8 mg total) by mouth 2 (two) times daily. Start the day after chemo for 3 days. Then take as needed for nausea or vomiting. 30 tablet 2  . oxyCODONE (OXY IR/ROXICODONE) 5 MG immediate release tablet Take 1 tablet (5 mg total) by mouth every 4 (four) hours as needed for severe pain. 30 tablet 0  . potassium chloride (K-DUR) 10 MEQ tablet Take 2 tablets (20 mEq total) by mouth daily. 60 tablet 0  . promethazine (PHENERGAN) 25 MG tablet TAKE ONE TABLET BY MOUTH EVERY SIX HOURSAS NEEDED FOR NAUSEA OR VOMITING 30 tablet 0  . RABEprazole (ACIPHEX) 20 MG tablet Take 20 mg by mouth daily.     . simvastatin (ZOCOR) 20 MG tablet Take 20 mg by mouth every evening.      No current facility-administered medications for this visit.   Facility-Administered Medications Ordered in Other  Visits  Medication Dose Route Frequency Provider Last Rate Last Dose  . heparin lock flush 100 unit/mL  500 Units Intravenous Once Forest Gleason, MD   500 Units at 11/20/14 1116  . heparin lock flush 100 unit/mL  500 Units Intravenous Once Forest Gleason, MD      . sodium chloride 0.9 % injection 10 mL  10 mL Intravenous PRN Forest Gleason, MD   10 mL at 11/20/14 0916  . sodium chloride 0.9 % injection 10 mL  10 mL Intravenous PRN Forest Gleason, MD   10 mL at 01/21/15 0915    OBJECTIVE:  There were no vitals filed for this visit.   There is no weight on file to calculate BMI.    ECOG FS:1 - Symptomatic but completely ambulatory  PHYSICAL EXAM: General  status:  Performance status is good.  Patient said clinical signs of dehydration  Skin turgor is poor.  Mucous membranes are dry. HEENT: No evidence of stomatitis. Sclera and conjunctivae :: No jaundice.   pale looking. Lungs: Air  entry equal on both sides.  No rhonchi.  No rales.  Cardiac: Heart sounds are normal.  No pericardial rub.  No murmur. There is some swelling above port.  Soft.  No fluid.  No redness or tenderness. Lymphatic system: Cervical, axillary, inguinal, lymph nodes not palpable GI: Abdomen is soft.  No ascites.  Liver liver is palpable.  Tenderness in left upper quadrant   No tenderness.  Bowel sounds are within normal limit Lower extremity: No edema Neurological system: Higher functions, cranial nerves intact no evidence of peripheral neuropathy.  Examination of both breasts.  Patient had down previous history of carcinoma of left breast no palpable masses axillary lymph nodes are normal.  Right breast free of masses.    LAB RESULTS:  Potassium is 2.8 Magnesium 1.6 Hemoglobin 9.9      MEDICAL DECISION MAKING:   Diarrhea has resolved. Hypokalemia and hypomagnesemia secondary to diarrhea will start patient on IV magnesium and IV potassium is patient has poor tolerance to oral medication Cytology from pleural fluid (on February 14, 2015) positive for small cell undifferentiated carcinoma of lung Considering poor tolerance to CPT-11 possibility of Doxil chemotherapy can be considered for progressing small cell undifferentiated carcinoma of lung provided patient's echocardiogram shows ejection fraction. Patient did have pacemaker placement and previous history of coronary artery disease Possibility of gemcitabine and Taxotere can be considered in cardiac functions are not within acceptable range     Forest Gleason, MD   02/21/2015 8:54 AM

## 2015-02-21 NOTE — Progress Notes (Signed)
MD aware of K 2.8 and mag 1.5.

## 2015-02-28 ENCOUNTER — Inpatient Hospital Stay: Payer: Medicare Other

## 2015-02-28 ENCOUNTER — Inpatient Hospital Stay (HOSPITAL_BASED_OUTPATIENT_CLINIC_OR_DEPARTMENT_OTHER): Payer: Medicare Other | Admitting: Oncology

## 2015-02-28 ENCOUNTER — Encounter: Payer: Self-pay | Admitting: Oncology

## 2015-02-28 VITALS — BP 155/70 | HR 92 | Temp 97.8°F | Wt 167.0 lb

## 2015-02-28 DIAGNOSIS — R0602 Shortness of breath: Secondary | ICD-10-CM

## 2015-02-28 DIAGNOSIS — E860 Dehydration: Secondary | ICD-10-CM

## 2015-02-28 DIAGNOSIS — E878 Other disorders of electrolyte and fluid balance, not elsewhere classified: Secondary | ICD-10-CM | POA: Diagnosis not present

## 2015-02-28 DIAGNOSIS — Z87891 Personal history of nicotine dependence: Secondary | ICD-10-CM

## 2015-02-28 DIAGNOSIS — J811 Chronic pulmonary edema: Secondary | ICD-10-CM

## 2015-02-28 DIAGNOSIS — I509 Heart failure, unspecified: Secondary | ICD-10-CM

## 2015-02-28 DIAGNOSIS — Z79899 Other long term (current) drug therapy: Secondary | ICD-10-CM

## 2015-02-28 DIAGNOSIS — M1990 Unspecified osteoarthritis, unspecified site: Secondary | ICD-10-CM

## 2015-02-28 DIAGNOSIS — E876 Hypokalemia: Secondary | ICD-10-CM

## 2015-02-28 DIAGNOSIS — J90 Pleural effusion, not elsewhere classified: Secondary | ICD-10-CM

## 2015-02-28 DIAGNOSIS — E119 Type 2 diabetes mellitus without complications: Secondary | ICD-10-CM

## 2015-02-28 DIAGNOSIS — C787 Secondary malignant neoplasm of liver and intrahepatic bile duct: Secondary | ICD-10-CM | POA: Diagnosis not present

## 2015-02-28 DIAGNOSIS — J449 Chronic obstructive pulmonary disease, unspecified: Secondary | ICD-10-CM

## 2015-02-28 DIAGNOSIS — C3411 Malignant neoplasm of upper lobe, right bronchus or lung: Secondary | ICD-10-CM

## 2015-02-28 DIAGNOSIS — Z853 Personal history of malignant neoplasm of breast: Secondary | ICD-10-CM

## 2015-02-28 DIAGNOSIS — F329 Major depressive disorder, single episode, unspecified: Secondary | ICD-10-CM

## 2015-02-28 DIAGNOSIS — Z23 Encounter for immunization: Secondary | ICD-10-CM

## 2015-02-28 DIAGNOSIS — D649 Anemia, unspecified: Secondary | ICD-10-CM

## 2015-02-28 DIAGNOSIS — I2510 Atherosclerotic heart disease of native coronary artery without angina pectoris: Secondary | ICD-10-CM

## 2015-02-28 DIAGNOSIS — Z794 Long term (current) use of insulin: Secondary | ICD-10-CM

## 2015-02-28 DIAGNOSIS — M545 Low back pain: Secondary | ICD-10-CM

## 2015-02-28 DIAGNOSIS — I209 Angina pectoris, unspecified: Secondary | ICD-10-CM

## 2015-02-28 DIAGNOSIS — R5383 Other fatigue: Secondary | ICD-10-CM

## 2015-02-28 DIAGNOSIS — K219 Gastro-esophageal reflux disease without esophagitis: Secondary | ICD-10-CM

## 2015-02-28 DIAGNOSIS — Z86010 Personal history of colonic polyps: Secondary | ICD-10-CM

## 2015-02-28 DIAGNOSIS — I10 Essential (primary) hypertension: Secondary | ICD-10-CM

## 2015-02-28 DIAGNOSIS — Z7982 Long term (current) use of aspirin: Secondary | ICD-10-CM

## 2015-02-28 DIAGNOSIS — R531 Weakness: Secondary | ICD-10-CM

## 2015-02-28 DIAGNOSIS — G8929 Other chronic pain: Secondary | ICD-10-CM

## 2015-02-28 DIAGNOSIS — Z950 Presence of cardiac pacemaker: Secondary | ICD-10-CM

## 2015-02-28 DIAGNOSIS — I4891 Unspecified atrial fibrillation: Secondary | ICD-10-CM

## 2015-02-28 DIAGNOSIS — M129 Arthropathy, unspecified: Secondary | ICD-10-CM

## 2015-02-28 LAB — BASIC METABOLIC PANEL
Anion gap: 5 (ref 5–15)
BUN: 21 mg/dL — AB (ref 6–20)
CHLORIDE: 107 mmol/L (ref 101–111)
CO2: 24 mmol/L (ref 22–32)
CREATININE: 0.81 mg/dL (ref 0.44–1.00)
Calcium: 7.7 mg/dL — ABNORMAL LOW (ref 8.9–10.3)
GFR calc Af Amer: >60 mL/min (ref >60)
GLUCOSE: 191 mg/dL — AB (ref 65–99)
Potassium: 4 mmol/L (ref 3.5–5.1)
SODIUM: 136 mmol/L (ref 135–145)

## 2015-02-28 LAB — CBC WITH DIFFERENTIAL/PLATELET
BASOS ABS: 0.1 10*3/uL (ref 0–0.1)
Basophils Relative: 1 %
EOS PCT: 0 %
Eosinophils Absolute: 0 10*3/uL (ref 0–0.7)
HEMATOCRIT: 32.3 % — AB (ref 35.0–47.0)
Hemoglobin: 10.4 g/dL — ABNORMAL LOW (ref 12.0–16.0)
LYMPHS PCT: 16 %
Lymphs Abs: 1 10*3/uL (ref 1.0–3.6)
MCH: 30 pg (ref 26.0–34.0)
MCHC: 32.1 g/dL (ref 32.0–36.0)
MCV: 93.4 fL (ref 80.0–100.0)
MONO ABS: 0.5 10*3/uL (ref 0.2–0.9)
MONOS PCT: 8 %
Neutro Abs: 4.7 10*3/uL (ref 1.4–6.5)
Neutrophils Relative %: 75 %
PLATELETS: 304 10*3/uL (ref 150–440)
RBC: 3.46 MIL/uL — ABNORMAL LOW (ref 3.80–5.20)
RDW: 25 % — AB (ref 11.5–14.5)
WBC: 6.3 10*3/uL (ref 3.6–11.0)

## 2015-02-28 LAB — MAGNESIUM: MAGNESIUM: 1.5 mg/dL — AB (ref 1.7–2.4)

## 2015-02-28 MED ORDER — HEPARIN SOD (PORK) LOCK FLUSH 100 UNIT/ML IV SOLN
INTRAVENOUS | Status: AC
  Filled 2015-02-28: qty 5

## 2015-02-28 MED ORDER — SODIUM CHLORIDE 0.9 % IJ SOLN
10.0000 mL | INTRAMUSCULAR | Status: DC | PRN
  Administered 2015-02-28: 10 mL via INTRAVENOUS
  Filled 2015-02-28: qty 10

## 2015-02-28 MED ORDER — HEPARIN SOD (PORK) LOCK FLUSH 100 UNIT/ML IV SOLN
500.0000 [IU] | Freq: Once | INTRAVENOUS | Status: AC
  Administered 2015-02-28: 500 [IU] via INTRAVENOUS

## 2015-02-28 MED ORDER — INFLUENZA VAC SPLIT QUAD 0.5 ML IM SUSY
0.5000 mL | PREFILLED_SYRINGE | Freq: Once | INTRAMUSCULAR | Status: AC
  Administered 2015-02-28: 0.5 mL via INTRAMUSCULAR
  Filled 2015-02-28: qty 0.5

## 2015-03-01 ENCOUNTER — Encounter: Payer: Self-pay | Admitting: Oncology

## 2015-03-01 NOTE — Progress Notes (Signed)
[place] @ [facility] [phone]  [phone]     Wendy Giles OB: 1933-02-27  MR#: [id number]  ZLD#:[id number]  Patient Care Team: Tracie Harrier, MD as PCP - General (Internal Medicine) Isaias Cowman, MD as Consulting Physician (Cardiology)  CHIEF COMPLAINT:  Chief Complaint  Patient presents with  . OTHER       Carcinoma of breast                                AJCC Staging:  [id number]                               Stage Grouping: I                               Cancer Status: Evidence of disease.                               Estrogen receptor positive and                               progesterone receptor positive. HER- 2                               Negative. 2.  Abnormal CT scan of the chest (June, 2016) Oncology History   1.  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  Previous history of carcinoma breast stage I disease 3.  Started on chemotherapy with carboplatinum and VP-16 from November 20, 2014 MRI scan of brain was negative (July, 2016)   Patient was started on carboplatinum and VP-16 which has been discontinued in September of 2016 (received total of 3 cycles of chemotherapy) CT scan did not see any significant response 4, patient will be started on carboplatinum and CPT-11 from January 28, 2015  5.  Because of side effect of CPT-11 with significant diarrhea patient has decided to stop treatment with CPT-11 (October 2 016 6.  Resolution of side effect patient desires to continue chemotherapy.  Cannot receive Doxil because of history of congestive heart failure so patient would be given gemcitabine and Taxotere starting from March 04, 2015 Palliative care options being considered  Oncology Flowsheet 01/01/2015 01/02/2015 01/28/2015 02/04/2015 02/10/2015 02/12/2015  02/13/2015  Day, Cycle Day 2, Cycle 3 Day 3, Cycle 3 Day 1, Cycle 1 Day 8, Cycle 1 - - -  CARBOplatin (PARAPLATIN) IV - - 350 mg - - - -  dexamethasone (DECADRON) IV [ 10 mg ] [ 10 mg ] [ 12 mg ] [ 10 mg ] - - -  etoposide (VEPESID) IV 80 mg/m2 80 mg/m2 - - - - -  fosaprepitant (EMEND) IV - - [ 150 mg ] - - - -  irinotecan (CAMPTOSAR) IV - - 65 mg/m2 65 mg/m2 - - -  octreotide (SANDOSTATIN LAR) IM - - - - - - 20 mg  octreotide (SANDOSTATIN) [place] - - - - - 200 mcg -  ondansetron (ZOFRAN) IV [ 8 mg ] [ 8 mg ] - [  16 mg ] 4 mg - -  palonosetron (ALOXI) IV - - 0.25 mg - - - -  pegfilgrastim (NEULASTA ONPRO KIT) [place] - 6 mg - - - - -    INTERVAL HISTORY: 81 year old lady with 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  Diarrhea has resolved.  With fentanyl patch pain is better control.  Patient is feeling stronger.  No nausea.  No vomiting.  Appetite is improving.  No tingling.  No numbness. CT scan and echocardiogram has been reviewed from primary care office.  Patient did have pulmonary edema.  Pleural fluid was consistent with metastatic small cell cancer  REVIEW OF SYSTEMS:    general status: Patient is feeling weak and tired.  No change in a performance status.  No chills.  No fever. HEENT: Alopecia.  No evidence of stomatitis Chest membranes are dry Lungs: No cough or shortness of breath Had thoracentesis and fluid was positive for small cell undifferentiated carcinoma of lung Cardiac: No chest pain or paroxysmal nocturnal dyspnea GI: Diarrhea has improved Skin: Poor skin turgor Lower extremity no swelling Neurological system: No tingling.  No numbness.  No other focal signs Musculoskeletal system no bony pains  Genitourinary system: No dysuria.  No hematuria.  No frequency.  No flank pain. Neurological system: No dizziness.  No tingling.  His was.  no tingling numbness.  no focal weakness or any focal signs. Depression has improved ve.  PAST MEDICAL HISTORY: Past  Medical History  Diagnosis Date  . CHF (congestive heart failure) ([place])   . Diabetes mellitus without complication ([place])   . Hypertension   . Presence of permanent cardiac pacemaker   . Dysrhythmia     afib  . Depression   . GERD (gastroesophageal reflux disease)   . Coronary artery disease   . Cancer ([place])     breast,uterine  . COPD (chronic obstructive pulmonary disease) ([place])   . Arthritis   . Anemia   . Anginal pain ([place])   . Cancer of upper lobe of right lung ([place]) 11/03/2014  . Breast cancer [facility])       Surgical History            Diabetes controlled with oral medication                               Previous history of congestive heart                               failure                               Coronary artery disease with stent                               placement and bypass surgery Hypertension                               Osteoarthritis                               Colon polyps  Chronic low back pain                                                              Past Surgical History:                               Coronary bypass surgery and stent                               placement                                Pacemaker because of atrial fibrillation                               Appendectomy                               Hemorrhoidectomy                                                              Family History:                               Family history of breast cancer and colon                               cancer. History of anemia, diabetes, heart                               disease, and hypertension in the family.                                                              Social History:                               Does not smoke now. Does not drink. Used                               to smoke in the pa ADVANCED DIRECTIVES:  No flowsheet data found.  HEALTH MAINTENANCE: Social History  Substance  Use Topics  . Smoking status: Former Smoker    Quit date: 10/21/1997  . Smokeless tobacco: Never Used  . Alcohol Use: No      Allergies  Allergen Reactions  . Amoxicillin-Pot Clavulanate Hives and Diarrhea  . Fentanyl Itching  .  Codeine Nausea And Vomiting and Other (See Comments)    Other Reaction: "very ill"  . Latex Rash  . Other Rash    Current Outpatient Prescriptions  Medication Sig Dispense Refill  . albuterol (PROAIR HFA) 108 (90 BASE) MCG/ACT inhaler Inhale into the lungs.    . ALPRAZolam (XANAX) 0.5 MG tablet Take 0.5 mg by mouth once a week.     Marland Kitchen aspirin 81 MG tablet Take 81 mg by mouth daily.     Marland Kitchen bismuth subsalicylate (KAOPECTATE) 262 MG/15ML suspension Take 30 mLs by mouth 4 (four) times daily. 360 mL 0  . clindamycin (CLEOCIN) 150 MG capsule Take 1 capsule (150 mg total) by mouth 3 (three) times daily. 30 capsule 0  . cyclobenzaprine (FLEXERIL) 5 MG tablet Take 5 mg by mouth daily as needed for muscle spasms.     . dabigatran (PRADAXA) 150 MG CAPS capsule TAKE ONE CAPSULE BY MOUTH TWICE DAILY    . dicyclomine (BENTYL) 10 MG capsule Take 10 mg by mouth 3 (three) times daily as needed (abdominal pain).     . fentaNYL (DURAGESIC - DOSED MCG/HR) 12 MCG/HR Place 1 patch (12.5 mcg total) onto the skin every 3 (three) days. 10 patch 0  . furosemide (LASIX) 40 MG tablet Take 40 mg by mouth 2 (two) times daily.     Marland Kitchen gabapentin (NEURONTIN) 100 MG capsule Take 100 mg by mouth every morning.     Marland Kitchen glipiZIDE (GLUCOTROL) 10 MG tablet Take 10 mg by mouth 2 (two) times daily before a meal.   0  . insulin lispro (HUMALOG) 100 UNIT/ML KiwkPen as per sliding scale< than 150-- No insulin150-200---4 units 201-250---6 units251-300--- 8 units301-350----10 units.351-400---12 units    . isosorbide mononitrate (IMDUR) 60 MG 24 hr tablet Take 60 mg by mouth daily.     Marland Kitchen levothyroxine (SYNTHROID, LEVOTHROID) 25 MCG tablet Take 25 mcg by mouth daily before breakfast.     .  lidocaine-prilocaine (EMLA) cream Apply 1 application topically as needed. 30 g 3  . Liraglutide 18 MG/3ML SOPN Inject 3 mLs into the skin daily.     Marland Kitchen losartan (COZAAR) 100 MG tablet Take 100 mg by mouth daily.     . magic mouthwash w/lidocaine SOLN Take 5 mLs by mouth 4 (four) times daily as needed for mouth pain. 480 mL 3  . Magnesium-Calcium-Folic Acid 170-017-4 MG TABS Take 1 tablet by mouth daily.     . metFORMIN (GLUCOPHAGE) 1000 MG tablet Take 1,000 mg by mouth 2 (two) times daily.     . metoprolol-hydrochlorothiazide (LOPRESSOR HCT) 50-25 MG per tablet Take 1 tablet by mouth daily.     . metroNIDAZOLE (FLAGYL) 500 MG tablet Take 1 tablet (500 mg total) by mouth 3 (three) times daily. 21 tablet 0  . nitroGLYCERIN (NITROSTAT) 0.4 MG SL tablet Place 0.4 mg under the tongue every 5 (five) minutes as needed for chest pain.     Marland Kitchen ondansetron (ZOFRAN) 8 MG tablet Take 1 tablet (8 mg total) by mouth 2 (two) times daily. Start the day after chemo for 3 days. Then take as needed for nausea or vomiting. 30 tablet 2  . oxyCODONE (OXY IR/ROXICODONE) 5 MG immediate release tablet Take 1 tablet (5 mg total) by mouth every 4 (four) hours as needed for severe pain. 30 tablet 0  . RABEprazole (ACIPHEX) 20 MG tablet Take 20 mg by mouth daily.     . simvastatin (ZOCOR) 20 MG tablet Take 20 mg by mouth every  evening.     . potassium chloride (K-DUR) 10 MEQ tablet Take 2 tablets (20 mEq total) by mouth daily. 60 tablet 0  . promethazine (PHENERGAN) 25 MG tablet TAKE ONE TABLET BY MOUTH EVERY SIX HOURSAS NEEDED FOR NAUSEA OR VOMITING 30 tablet 0   No current facility-administered medications for this visit.   Facility-Administered Medications Ordered in Other Visits  Medication Dose Route Frequency Provider Last Rate Last Dose  . heparin lock flush 100 unit/mL  500 Units Intravenous Once Forest Gleason, MD   500 Units at 11/20/14 1116  . sodium chloride 0.9 % injection 10 mL  10 mL Intravenous PRN Forest Gleason,  MD   10 mL at 11/20/14 0916  . sodium chloride 0.9 % injection 10 mL  10 mL Intravenous PRN Forest Gleason, MD   10 mL at 01/21/15 0915    OBJECTIVE:  Filed Vitals:   02/28/15 1429  BP: 155/70  Pulse: 92  Temp: 97.8 F (36.6 C)     Body mass index is 29.59 kg/(m^2).    ECOG FS:1 - Symptomatic but completely ambulatory  PHYSICAL EXAM: General  status: Performance status is improving     HEENT: No evidence of stomatitis. Sclera and conjunctivae :: No jaundice.   pale looking. Lungs: Air  entry equal on both sides.  No rhonchi.  No rales.  Cardiac: Heart sounds are normal.  No pericardial rub.  No murmur. There is some swelling above port.  Soft.  No fluid.  No redness or tenderness. Lymphatic system: Cervical, axillary, inguinal, lymph nodes not palpable GI: Abdomen is soft.  No ascites.  Liver liver is palpable.  Tenderness in left upper quadrant   No tenderness.  Bowel sounds are within normal limit Lower extremity: No edema Neurological system: Higher functions, cranial nerves intact no evidence of peripheral neuropathy.  Examination of both breasts.  Patient had down previous history of carcinoma of left breast no palpable masses axillary lymph nodes are normal.  Right breast free of masses.    LAB RESULTS:  White count is 6.3 hemoglobin is10.4 Platelet count 304      MEDICAL DECISION MAKING:   Diarrhea has resolved. Hypokalemia and hypomagnesemia secondary to diarrhea will start patient on IV magnesium and IV potassium is patient has poor tolerance to oral medication Cytology from pleural fluid (on February 14, 2015) positive for small cell undifferentiated carcinoma of lung Considering poor tolerance to CPT-11 possibility of Doxil chemotherapy can be considered for progressing small cell undifferentiated carcinoma of lung provided patient's echocardiogram shows ejection fraction. Patient did have pacemaker placement and previous history of coronary artery  disease Possibility of gemcitabine and Taxotere can be considered in cardiac functions are not within acceptable range  We also discussed possibility of hospice and palliative care patient desires to strive 1 more cycle of chemotherapy and see whether she can tolerate before making that decision CT scan has been reviewed independently and case was discussed in tumor conference Total duration of visit was30 minutes.  50% or more time was spent in counseling patient and family regarding prognosis and options of treatment and available resources   Forest Gleason, MD   03/01/2015 9:41 AM

## 2015-03-06 ENCOUNTER — Inpatient Hospital Stay: Payer: Medicare Other

## 2015-03-06 VITALS — BP 129/80 | HR 71 | Temp 97.0°F

## 2015-03-06 DIAGNOSIS — C3411 Malignant neoplasm of upper lobe, right bronchus or lung: Secondary | ICD-10-CM

## 2015-03-06 MED ORDER — SODIUM CHLORIDE 0.9 % IV SOLN
1800.0000 mg | Freq: Once | INTRAVENOUS | Status: AC
  Administered 2015-03-06: 1800 mg via INTRAVENOUS
  Filled 2015-03-06: qty 42.08

## 2015-03-06 MED ORDER — HEPARIN SOD (PORK) LOCK FLUSH 100 UNIT/ML IV SOLN
500.0000 [IU] | Freq: Once | INTRAVENOUS | Status: AC | PRN
Start: 2015-03-06 — End: 2015-03-06
  Administered 2015-03-06: 500 [IU]
  Filled 2015-03-06 (×2): qty 5

## 2015-03-06 MED ORDER — OXYCODONE HCL 5 MG PO TABS: 5.0000 mg | ORAL_TABLET | ORAL | Status: DC | PRN

## 2015-03-06 MED ORDER — SODIUM CHLORIDE 0.9 % IJ SOLN
10.0000 mL | INTRAMUSCULAR | Status: DC | PRN
  Administered 2015-03-06: 10 mL
  Filled 2015-03-06: qty 10

## 2015-03-06 MED ORDER — SODIUM CHLORIDE 0.9 % IV SOLN
Freq: Once | INTRAVENOUS | Status: AC
  Administered 2015-03-06: 09:00:00 via INTRAVENOUS
  Filled 2015-03-06: qty 1000

## 2015-03-06 MED ORDER — SODIUM CHLORIDE 0.9 % IV SOLN
Freq: Once | INTRAVENOUS | Status: AC
  Administered 2015-03-06: 09:00:00 via INTRAVENOUS
  Filled 2015-03-06: qty 5

## 2015-03-06 MED ORDER — PALONOSETRON HCL INJECTION 0.25 MG/5ML
0.2500 mg | Freq: Once | INTRAVENOUS | Status: AC
  Administered 2015-03-06: 0.25 mg via INTRAVENOUS
  Filled 2015-03-06: qty 5

## 2015-03-06 MED ORDER — DOCETAXEL CHEMO INJECTION 160 MG/16ML
40.0000 mg/m2 | Freq: Once | INTRAVENOUS | Status: AC
  Administered 2015-03-06: 70 mg via INTRAVENOUS
  Filled 2015-03-06: qty 7

## 2015-03-13 ENCOUNTER — Inpatient Hospital Stay (HOSPITAL_BASED_OUTPATIENT_CLINIC_OR_DEPARTMENT_OTHER): Payer: Medicare Other | Admitting: Oncology

## 2015-03-13 ENCOUNTER — Inpatient Hospital Stay: Payer: Medicare Other

## 2015-03-13 ENCOUNTER — Inpatient Hospital Stay: Payer: Medicare Other | Attending: Oncology

## 2015-03-13 VITALS — BP 142/89 | HR 87 | Temp 97.4°F | Wt 158.5 lb

## 2015-03-13 DIAGNOSIS — Z86010 Personal history of colonic polyps: Secondary | ICD-10-CM | POA: Insufficient documentation

## 2015-03-13 DIAGNOSIS — Z7982 Long term (current) use of aspirin: Secondary | ICD-10-CM | POA: Diagnosis not present

## 2015-03-13 DIAGNOSIS — Z800 Family history of malignant neoplasm of digestive organs: Secondary | ICD-10-CM | POA: Insufficient documentation

## 2015-03-13 DIAGNOSIS — Z853 Personal history of malignant neoplasm of breast: Secondary | ICD-10-CM

## 2015-03-13 DIAGNOSIS — Z87891 Personal history of nicotine dependence: Secondary | ICD-10-CM | POA: Insufficient documentation

## 2015-03-13 DIAGNOSIS — M129 Arthropathy, unspecified: Secondary | ICD-10-CM | POA: Insufficient documentation

## 2015-03-13 DIAGNOSIS — I4891 Unspecified atrial fibrillation: Secondary | ICD-10-CM

## 2015-03-13 DIAGNOSIS — R5383 Other fatigue: Secondary | ICD-10-CM | POA: Diagnosis not present

## 2015-03-13 DIAGNOSIS — I2510 Atherosclerotic heart disease of native coronary artery without angina pectoris: Secondary | ICD-10-CM | POA: Insufficient documentation

## 2015-03-13 DIAGNOSIS — E119 Type 2 diabetes mellitus without complications: Secondary | ICD-10-CM | POA: Diagnosis not present

## 2015-03-13 DIAGNOSIS — I10 Essential (primary) hypertension: Secondary | ICD-10-CM | POA: Diagnosis not present

## 2015-03-13 DIAGNOSIS — Z5111 Encounter for antineoplastic chemotherapy: Secondary | ICD-10-CM | POA: Diagnosis not present

## 2015-03-13 DIAGNOSIS — R110 Nausea: Secondary | ICD-10-CM

## 2015-03-13 DIAGNOSIS — I209 Angina pectoris, unspecified: Secondary | ICD-10-CM

## 2015-03-13 DIAGNOSIS — M545 Low back pain: Secondary | ICD-10-CM | POA: Insufficient documentation

## 2015-03-13 DIAGNOSIS — J449 Chronic obstructive pulmonary disease, unspecified: Secondary | ICD-10-CM | POA: Insufficient documentation

## 2015-03-13 DIAGNOSIS — R531 Weakness: Secondary | ICD-10-CM

## 2015-03-13 DIAGNOSIS — Z955 Presence of coronary angioplasty implant and graft: Secondary | ICD-10-CM | POA: Insufficient documentation

## 2015-03-13 DIAGNOSIS — Z950 Presence of cardiac pacemaker: Secondary | ICD-10-CM

## 2015-03-13 DIAGNOSIS — M1990 Unspecified osteoarthritis, unspecified site: Secondary | ICD-10-CM

## 2015-03-13 DIAGNOSIS — C787 Secondary malignant neoplasm of liver and intrahepatic bile duct: Secondary | ICD-10-CM | POA: Insufficient documentation

## 2015-03-13 DIAGNOSIS — Z803 Family history of malignant neoplasm of breast: Secondary | ICD-10-CM | POA: Insufficient documentation

## 2015-03-13 DIAGNOSIS — F329 Major depressive disorder, single episode, unspecified: Secondary | ICD-10-CM | POA: Insufficient documentation

## 2015-03-13 DIAGNOSIS — E860 Dehydration: Secondary | ICD-10-CM

## 2015-03-13 DIAGNOSIS — D649 Anemia, unspecified: Secondary | ICD-10-CM | POA: Diagnosis not present

## 2015-03-13 DIAGNOSIS — G8929 Other chronic pain: Secondary | ICD-10-CM | POA: Insufficient documentation

## 2015-03-13 DIAGNOSIS — I509 Heart failure, unspecified: Secondary | ICD-10-CM | POA: Diagnosis not present

## 2015-03-13 DIAGNOSIS — C3411 Malignant neoplasm of upper lobe, right bronchus or lung: Secondary | ICD-10-CM

## 2015-03-13 DIAGNOSIS — Z794 Long term (current) use of insulin: Secondary | ICD-10-CM

## 2015-03-13 DIAGNOSIS — Z7984 Long term (current) use of oral hypoglycemic drugs: Secondary | ICD-10-CM | POA: Diagnosis not present

## 2015-03-13 DIAGNOSIS — Z8542 Personal history of malignant neoplasm of other parts of uterus: Secondary | ICD-10-CM | POA: Diagnosis not present

## 2015-03-13 DIAGNOSIS — K219 Gastro-esophageal reflux disease without esophagitis: Secondary | ICD-10-CM | POA: Insufficient documentation

## 2015-03-13 DIAGNOSIS — E876 Hypokalemia: Secondary | ICD-10-CM

## 2015-03-13 DIAGNOSIS — Z79899 Other long term (current) drug therapy: Secondary | ICD-10-CM

## 2015-03-13 DIAGNOSIS — R197 Diarrhea, unspecified: Secondary | ICD-10-CM | POA: Insufficient documentation

## 2015-03-13 LAB — CBC WITH DIFFERENTIAL/PLATELET
BASOS ABS: 0 10*3/uL (ref 0–0.1)
BASOS PCT: 1 %
EOS ABS: 0.1 10*3/uL (ref 0–0.7)
Eosinophils Relative: 2 %
HEMATOCRIT: 30.4 % — AB (ref 35.0–47.0)
HEMOGLOBIN: 9.8 g/dL — AB (ref 12.0–16.0)
Lymphocytes Relative: 30 %
Lymphs Abs: 1 10*3/uL (ref 1.0–3.6)
MCH: 30.1 pg (ref 26.0–34.0)
MCHC: 32.2 g/dL (ref 32.0–36.0)
MCV: 93.5 fL (ref 80.0–100.0)
MONOS PCT: 7 %
Monocytes Absolute: 0.3 10*3/uL (ref 0.2–0.9)
NEUTROS ABS: 2.1 10*3/uL (ref 1.4–6.5)
NEUTROS PCT: 60 %
Platelets: 116 10*3/uL — ABNORMAL LOW (ref 150–440)
RBC: 3.25 MIL/uL — AB (ref 3.80–5.20)
RDW: 19.3 % — ABNORMAL HIGH (ref 11.5–14.5)
WBC: 3.5 10*3/uL — AB (ref 3.6–11.0)

## 2015-03-13 MED ORDER — FENTANYL 12 MCG/HR TD PT72: 12.5000 ug | MEDICATED_PATCH | TRANSDERMAL | Status: DC

## 2015-03-13 MED ORDER — DEXAMETHASONE SODIUM PHOSPHATE 100 MG/10ML IJ SOLN
Freq: Once | INTRAMUSCULAR | Status: AC
  Administered 2015-03-13: 16:00:00 via INTRAVENOUS
  Filled 2015-03-13: qty 4

## 2015-03-13 MED ORDER — SODIUM CHLORIDE 0.9 % IJ SOLN
10.0000 mL | INTRAMUSCULAR | Status: DC | PRN
  Administered 2015-03-13: 10 mL via INTRAVENOUS
  Filled 2015-03-13: qty 10

## 2015-03-13 MED ORDER — HEPARIN SOD (PORK) LOCK FLUSH 100 UNIT/ML IV SOLN
500.0000 [IU] | Freq: Once | INTRAVENOUS | Status: AC
  Administered 2015-03-13: 500 [IU] via INTRAVENOUS
  Filled 2015-03-13: qty 5

## 2015-03-13 MED ORDER — SODIUM CHLORIDE 0.9 % IV SOLN
Freq: Once | INTRAVENOUS | Status: AC
  Administered 2015-03-13: 15:00:00 via INTRAVENOUS
  Filled 2015-03-13: qty 1000

## 2015-03-13 MED ORDER — SODIUM CHLORIDE 0.9 % IV SOLN
1800.0000 mg | Freq: Once | INTRAVENOUS | Status: AC
  Administered 2015-03-13: 1800 mg via INTRAVENOUS
  Filled 2015-03-13: qty 42.08

## 2015-03-13 NOTE — Progress Notes (Signed)
Patient states she has had nosebleeds the past 2 nights.  Also requesting refills for Fentanyl patches.

## 2015-03-23 ENCOUNTER — Encounter: Payer: Self-pay | Admitting: Oncology

## 2015-03-23 NOTE — Progress Notes (Signed)
[place] @ [facility] [phone]  [phone]     English Tomer OB: [birth year]  MR#: [id number]  QBV#:[id number]  Patient Care Team: Tracie Harrier, MD as PCP - General (Internal Medicine) Isaias Cowman, MD as Consulting Physician (Cardiology)  CHIEF COMPLAINT:  Chief Complaint  Patient presents with  . OTHER       Carcinoma of breast                                AJCC Staging:  [id number]                               Stage Grouping: I                               Cancer Status: Evidence of disease.                               Estrogen receptor positive and                               progesterone receptor positive. HER- 2                               Negative. 2.  Abnormal CT scan of the chest (June, 2016) Oncology History   1.  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  Previous history of carcinoma breast stage I disease 3.  Started on chemotherapy with carboplatinum and VP-16 from November 20, 2014 MRI scan of brain was negative (July, 2016)   Patient was started on carboplatinum and VP-16 which has been discontinued in September of 2016 (received total of 3 cycles of chemotherapy) CT scan did not see any significant response 4, patient will be started on carboplatinum and CPT-11 from January 28, 2015  5.  Patient had severe diarrhea so carboplatinum CPT-11 has been discontinued patient has been started on Taxotere and gemcitabine combination from November of 2016  Oncology Flowsheet 01/28/2015 02/04/2015 02/10/2015 02/12/2015 02/13/2015 03/06/2015 03/13/2015  Day, Cycle Day 1, Cycle 1 Day 8, Cycle 1 - - - Day 1, Cycle 1 Day 8, Cycle 1  CARBOplatin (PARAPLATIN) IV 350 mg - - - - - -  dexamethasone (DECADRON) IV [ 12 mg ] [ 10 mg ] - - - [ 12 mg ] [ 10 mg ]  DOCEtaxel  (TAXOTERE) IV - - - - - 40 mg/m2 -  etoposide (VEPESID) IV - - - - - - -  fosaprepitant (EMEND) IV [ 150 mg ] - - - - [ 150 mg ] -  gemcitabine (GEMZAR) IV - - - - - 1,800 mg 1,800 mg  irinotecan (CAMPTOSAR) IV 65 mg/m2 65 mg/m2 - - - - -  octreotide (SANDOSTATIN LAR) IM - - - - 20 mg - -  octreotide (SANDOSTATIN) Reed - - - 200 mcg - - -  ondansetron (ZOFRAN) IV - [ 16 mg ] 4 mg - - - [ 8 mg ]  palonosetron (ALOXI) IV 0.25  mg - - - - 0.25 mg -  pegfilgrastim (NEULASTA ONPRO KIT) [place]ino - - - - - - -    INTERVAL HISTORY: 81 year old lady with 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  Recently had shortness of breath had the pleural effusion thoracentesis was done and cytology was positive for small cell undifferentiated carcinoma of lung Recent episode with diarrhea has resolved. g. Abdominal pain has improved on present pain medication Diarrhea has improved patient is feeling better. Here to initiate chemotherapy with Taxotere and gemcitabine and continue gemcitabine therapy.   REVIEW OF SYSTEMS:    general status: Patient is feeling weak and tired.  No change in a performance status.  No chills.  No fever. HEENT: Alopecia.  No evidence of stomatitis Chest membranes are dry Lungs: No cough or shortness of breath Had thoracentesis and fluid was positive for small cell undifferentiated carcinoma of lung Cardiac: No chest pain or paroxysmal nocturnal dyspnea GI: Diarrhea has improved Skin: Poor skin turgor Lower extremity no swelling Neurological system: No tingling.  No numbness.  No other focal signs Musculoskeletal system no bony pains  Genitourinary system: No dysuria.  No hematuria.  No frequency.  No flank pain. Neurological system: No dizziness.  No tingling.  His was.  no tingling numbness.  no focal weakness or any focal signs. Depression has improved ve.  PAST MEDICAL HISTORY: Past Medical History  Diagnosis Date  . CHF (congestive heart failure) ([place])    . Diabetes mellitus without complication ([place])   . Hypertension   . Presence of permanent cardiac pacemaker   . Dysrhythmia     afib  . Depression   . GERD (gastroesophageal reflux disease)   . Coronary artery disease   . Cancer ([place])     breast,uterine  . COPD (chronic obstructive pulmonary disease) ([place])   . Arthritis   . Anemia   . Anginal pain ([place])   . Cancer of upper lobe of right lung ([place]) 11/03/2014  . Breast cancer [facility])       Surgical History            Diabetes controlled with oral medication                               Previous history of congestive heart                               failure                               Coronary artery disease with stent                               placement and bypass surgery Hypertension                               Osteoarthritis                               Colon polyps                               Chronic  low back pain                                                              Past Surgical History:                               Coronary bypass surgery and stent                               placement                                Pacemaker because of atrial fibrillation                               Appendectomy                               Hemorrhoidectomy                                                              Family History:                               Family history of breast cancer and colon                               cancer. History of anemia, diabetes, heart                               disease, and hypertension in the family.                                                              Social History:                               Does not smoke now. Does not drink. Used                               to smoke in the pa ADVANCED DIRECTIVES:  No flowsheet data found.  HEALTH MAINTENANCE: Social History  Substance Use Topics  . Smoking status: Former Smoker    Quit date: 10/21/1997  .  Smokeless tobacco: Never Used  . Alcohol Use: No      Allergies  Allergen Reactions  . Amoxicillin-Pot Clavulanate Hives and Diarrhea  . Fentanyl Itching  .  Codeine Nausea And Vomiting and Other (See Comments)    Other Reaction: "very ill"  . Latex Rash  . Other Rash    Current Outpatient Prescriptions  Medication Sig Dispense Refill  . albuterol (PROAIR HFA) 108 (90 BASE) MCG/ACT inhaler Inhale into the lungs.    . ALPRAZolam (XANAX) 0.5 MG tablet Take 0.5 mg by mouth once a week.     Marland Kitchen aspirin 81 MG tablet Take 81 mg by mouth daily.     Marland Kitchen bismuth subsalicylate (KAOPECTATE) 262 MG/15ML suspension Take 30 mLs by mouth 4 (four) times daily. 360 mL 0  . clindamycin (CLEOCIN) 150 MG capsule Take 1 capsule (150 mg total) by mouth 3 (three) times daily. 30 capsule 0  . cyclobenzaprine (FLEXERIL) 5 MG tablet Take 5 mg by mouth daily as needed for muscle spasms.     . dabigatran (PRADAXA) 150 MG CAPS capsule TAKE ONE CAPSULE BY MOUTH TWICE DAILY    . dicyclomine (BENTYL) 10 MG capsule Take 10 mg by mouth 3 (three) times daily as needed (abdominal pain).     . fentaNYL (DURAGESIC - DOSED MCG/HR) 12 MCG/HR Place 1 patch (12.5 mcg total) onto the skin every 3 (three) days. 10 patch 0  . furosemide (LASIX) 40 MG tablet Take 40 mg by mouth 2 (two) times daily.     Marland Kitchen gabapentin (NEURONTIN) 100 MG capsule Take 100 mg by mouth every morning.     Marland Kitchen glipiZIDE (GLUCOTROL) 10 MG tablet Take 10 mg by mouth 2 (two) times daily before a meal.   0  . insulin lispro (HUMALOG) 100 UNIT/ML KiwkPen as per sliding scale< than 150-- No insulin150-200---4 units 201-250---6 units251-300--- 8 units301-350----10 units.351-400---12 units    . isosorbide mononitrate (IMDUR) 60 MG 24 hr tablet Take 60 mg by mouth daily.     Marland Kitchen levothyroxine (SYNTHROID, LEVOTHROID) 25 MCG tablet Take 25 mcg by mouth daily before breakfast.     . lidocaine-prilocaine (EMLA) cream Apply 1 application topically as needed. 30 g 3  .  Liraglutide 18 MG/3ML SOPN Inject 3 mLs into the skin daily.     Marland Kitchen losartan (COZAAR) 100 MG tablet Take 100 mg by mouth daily.     . magic mouthwash w/lidocaine SOLN Take 5 mLs by mouth 4 (four) times daily as needed for mouth pain. 480 mL 3  . Magnesium-Calcium-Folic Acid 811-914-7 MG TABS Take 1 tablet by mouth daily.     . metFORMIN (GLUCOPHAGE) 1000 MG tablet Take 1,000 mg by mouth 2 (two) times daily.     . metoprolol-hydrochlorothiazide (LOPRESSOR HCT) 50-25 MG per tablet Take 1 tablet by mouth daily.     . metroNIDAZOLE (FLAGYL) 500 MG tablet Take 1 tablet (500 mg total) by mouth 3 (three) times daily. 21 tablet 0  . nitroGLYCERIN (NITROSTAT) 0.4 MG SL tablet Place 0.4 mg under the tongue every 5 (five) minutes as needed for chest pain.     Marland Kitchen ondansetron (ZOFRAN) 8 MG tablet Take 1 tablet (8 mg total) by mouth 2 (two) times daily. Start the day after chemo for 3 days. Then take as needed for nausea or vomiting. 30 tablet 2  . oxyCODONE (OXY IR/ROXICODONE) 5 MG immediate release tablet Take 1 tablet (5 mg total) by mouth every 4 (four) hours as needed for severe pain. 30 tablet 0  . potassium chloride (K-DUR) 10 MEQ tablet Take 2 tablets (20 mEq total) by mouth daily. 60 tablet 0  . promethazine (PHENERGAN) 25 MG tablet TAKE ONE  TABLET BY MOUTH EVERY SIX HOURSAS NEEDED FOR NAUSEA OR VOMITING 30 tablet 0  . RABEprazole (ACIPHEX) 20 MG tablet Take 20 mg by mouth daily.     . simvastatin (ZOCOR) 20 MG tablet Take 20 mg by mouth every evening.      No current facility-administered medications for this visit.   Facility-Administered Medications Ordered in Other Visits  Medication Dose Route Frequency Provider Last Rate Last Dose  . heparin lock flush 100 unit/mL  500 Units Intravenous Once Forest Gleason, MD   500 Units at 11/20/14 1116  . sodium chloride 0.9 % injection 10 mL  10 mL Intravenous PRN Forest Gleason, MD   10 mL at 11/20/14 0916  . sodium chloride 0.9 % injection 10 mL  10 mL  Intravenous PRN Forest Gleason, MD   10 mL at 01/21/15 0915    OBJECTIVE:  Filed Vitals:   03/13/15 1436  BP: 142/89  Pulse: 87  Temp: 97.4 F (36.3 C)     Body mass index is 28.09 kg/(m^2).    ECOG FS:1 - Symptomatic but completely ambulatory  PHYSICAL EXAM: General  status: Performance status is good.  Patient said clinical signs of dehydration  Skin turgor is poor.  Mucous membranes are dry. HEENT: No evidence of stomatitis. Sclera and conjunctivae :: No jaundice.   pale looking. Lungs: Air  entry equal on both sides.  No rhonchi.  No rales.  Cardiac: Heart sounds are normal.  No pericardial rub.  No murmur. There is some swelling above port.  Soft.  No fluid.  No redness or tenderness. Lymphatic system: Cervical, axillary, inguinal, lymph nodes not palpable GI: Abdomen is soft.  No ascites.  Liver liver is palpable.  Tenderness in left upper quadrant   No tenderness.  Bowel sounds are within normal limit Lower extremity: No edema Neurological system: Higher functions, cranial nerves intact no evidence of peripheral neuropathy.  Examination of both breasts.  Patient had down previous history of carcinoma of left breast no palpable masses axillary lymph nodes are normal.  Right breast free of masses.   All lab data has been reviewed  MEDICAL DECISION MAKING:   Diarrhea has resolved.tc Hypokalemia and hypomagnesemia secondary to diarrhea will start patient on IV magnesium and IV potassium is patient has poor tolerance to oral medication Cytology from pleural fluid (on February 14, 2015) positive for small cell undifferentiated carcinoma of lung Considering poor tolerance to CPT-11 possibility of Doxil chemotherapy can be considered for progressing small cell undifferentiated carcinoma of lung provided patient's echocardiogram shows ejection fraction. Patient did have pacemaker placement and previous history of coronary artery disease Continue chemotherapy with  gemcitabine. Increased dose of Taxotere gradually on day 1 in next cycle Repeat CT scan after total 3 cycles of chemotherapy     Forest Gleason, MD   03/23/2015 7:42 AM

## 2015-03-27 ENCOUNTER — Encounter: Payer: Self-pay | Admitting: Oncology

## 2015-03-27 ENCOUNTER — Inpatient Hospital Stay (HOSPITAL_BASED_OUTPATIENT_CLINIC_OR_DEPARTMENT_OTHER): Payer: Medicare Other | Admitting: Oncology

## 2015-03-27 ENCOUNTER — Inpatient Hospital Stay: Payer: Medicare Other

## 2015-03-27 VITALS — BP 128/80 | HR 90 | Temp 97.7°F | Wt 160.3 lb

## 2015-03-27 DIAGNOSIS — I10 Essential (primary) hypertension: Secondary | ICD-10-CM

## 2015-03-27 DIAGNOSIS — R5383 Other fatigue: Secondary | ICD-10-CM

## 2015-03-27 DIAGNOSIS — R197 Diarrhea, unspecified: Secondary | ICD-10-CM | POA: Diagnosis not present

## 2015-03-27 DIAGNOSIS — K219 Gastro-esophageal reflux disease without esophagitis: Secondary | ICD-10-CM

## 2015-03-27 DIAGNOSIS — D649 Anemia, unspecified: Secondary | ICD-10-CM

## 2015-03-27 DIAGNOSIS — M545 Low back pain: Secondary | ICD-10-CM

## 2015-03-27 DIAGNOSIS — M1990 Unspecified osteoarthritis, unspecified site: Secondary | ICD-10-CM

## 2015-03-27 DIAGNOSIS — R918 Other nonspecific abnormal finding of lung field: Secondary | ICD-10-CM

## 2015-03-27 DIAGNOSIS — Z955 Presence of coronary angioplasty implant and graft: Secondary | ICD-10-CM

## 2015-03-27 DIAGNOSIS — Z803 Family history of malignant neoplasm of breast: Secondary | ICD-10-CM

## 2015-03-27 DIAGNOSIS — Z794 Long term (current) use of insulin: Secondary | ICD-10-CM

## 2015-03-27 DIAGNOSIS — Z8542 Personal history of malignant neoplasm of other parts of uterus: Secondary | ICD-10-CM

## 2015-03-27 DIAGNOSIS — R110 Nausea: Secondary | ICD-10-CM

## 2015-03-27 DIAGNOSIS — C3411 Malignant neoplasm of upper lobe, right bronchus or lung: Secondary | ICD-10-CM

## 2015-03-27 DIAGNOSIS — Z950 Presence of cardiac pacemaker: Secondary | ICD-10-CM

## 2015-03-27 DIAGNOSIS — I209 Angina pectoris, unspecified: Secondary | ICD-10-CM

## 2015-03-27 DIAGNOSIS — F329 Major depressive disorder, single episode, unspecified: Secondary | ICD-10-CM

## 2015-03-27 DIAGNOSIS — R531 Weakness: Secondary | ICD-10-CM

## 2015-03-27 DIAGNOSIS — Z86010 Personal history of colonic polyps: Secondary | ICD-10-CM

## 2015-03-27 DIAGNOSIS — Z800 Family history of malignant neoplasm of digestive organs: Secondary | ICD-10-CM

## 2015-03-27 DIAGNOSIS — I4891 Unspecified atrial fibrillation: Secondary | ICD-10-CM

## 2015-03-27 DIAGNOSIS — Z87891 Personal history of nicotine dependence: Secondary | ICD-10-CM

## 2015-03-27 DIAGNOSIS — Z79899 Other long term (current) drug therapy: Secondary | ICD-10-CM

## 2015-03-27 DIAGNOSIS — Z7982 Long term (current) use of aspirin: Secondary | ICD-10-CM

## 2015-03-27 DIAGNOSIS — E860 Dehydration: Secondary | ICD-10-CM | POA: Diagnosis not present

## 2015-03-27 DIAGNOSIS — M129 Arthropathy, unspecified: Secondary | ICD-10-CM

## 2015-03-27 DIAGNOSIS — Z853 Personal history of malignant neoplasm of breast: Secondary | ICD-10-CM

## 2015-03-27 DIAGNOSIS — I2510 Atherosclerotic heart disease of native coronary artery without angina pectoris: Secondary | ICD-10-CM

## 2015-03-27 DIAGNOSIS — E119 Type 2 diabetes mellitus without complications: Secondary | ICD-10-CM

## 2015-03-27 DIAGNOSIS — G8929 Other chronic pain: Secondary | ICD-10-CM

## 2015-03-27 DIAGNOSIS — J449 Chronic obstructive pulmonary disease, unspecified: Secondary | ICD-10-CM

## 2015-03-27 DIAGNOSIS — E876 Hypokalemia: Secondary | ICD-10-CM

## 2015-03-27 DIAGNOSIS — I509 Heart failure, unspecified: Secondary | ICD-10-CM

## 2015-03-27 LAB — CBC WITH DIFFERENTIAL/PLATELET
BASOS ABS: 0 10*3/uL (ref 0–0.1)
BASOS PCT: 0 %
EOS PCT: 0 %
Eosinophils Absolute: 0 10*3/uL (ref 0–0.7)
HCT: 31 % — ABNORMAL LOW (ref 35.0–47.0)
Hemoglobin: 9.9 g/dL — ABNORMAL LOW (ref 12.0–16.0)
LYMPHS PCT: 6 %
Lymphs Abs: 0.8 10*3/uL — ABNORMAL LOW (ref 1.0–3.6)
MCH: 30.3 pg (ref 26.0–34.0)
MCHC: 32 g/dL (ref 32.0–36.0)
MCV: 94.7 fL (ref 80.0–100.0)
Monocytes Absolute: 0.8 10*3/uL (ref 0.2–0.9)
Monocytes Relative: 6 %
Neutro Abs: 12.3 10*3/uL — ABNORMAL HIGH (ref 1.4–6.5)
Neutrophils Relative %: 88 %
PLATELETS: 325 10*3/uL (ref 150–440)
RBC: 3.27 MIL/uL — AB (ref 3.80–5.20)
RDW: 19.6 % — ABNORMAL HIGH (ref 11.5–14.5)
WBC: 14 10*3/uL — AB (ref 3.6–11.0)

## 2015-03-27 LAB — COMPREHENSIVE METABOLIC PANEL
ALK PHOS: 187 U/L — AB (ref 38–126)
ALT: 15 U/L (ref 14–54)
ANION GAP: 9 (ref 5–15)
AST: 29 U/L (ref 15–41)
Albumin: 3.5 g/dL (ref 3.5–5.0)
BILIRUBIN TOTAL: 0.9 mg/dL (ref 0.3–1.2)
BUN: 14 mg/dL (ref 6–20)
CALCIUM: 7.4 mg/dL — AB (ref 8.9–10.3)
CO2: 27 mmol/L (ref 22–32)
CREATININE: 0.84 mg/dL (ref 0.44–1.00)
Chloride: 97 mmol/L — ABNORMAL LOW (ref 101–111)
GFR calc non Af Amer: >60 mL/min (ref >60)
Glucose, Bld: 242 mg/dL — ABNORMAL HIGH (ref 65–99)
Potassium: 3.2 mmol/L — ABNORMAL LOW (ref 3.5–5.1)
Sodium: 133 mmol/L — ABNORMAL LOW (ref 135–145)
TOTAL PROTEIN: 6.5 g/dL (ref 6.5–8.1)

## 2015-03-27 MED ORDER — OXYCODONE HCL 5 MG PO TABS: 5.0000 mg | ORAL_TABLET | ORAL | Status: DC | PRN

## 2015-03-27 MED ORDER — HEPARIN SOD (PORK) LOCK FLUSH 100 UNIT/ML IV SOLN
500.0000 [IU] | Freq: Once | INTRAVENOUS | Status: AC
  Administered 2015-03-27: 500 [IU] via INTRAVENOUS
  Filled 2015-03-27: qty 5

## 2015-03-27 MED ORDER — LIDOCAINE-PRILOCAINE 2.5-2.5 % EX CREA: 1.0000 "application " | TOPICAL_CREAM | CUTANEOUS | Status: DC | PRN

## 2015-03-27 MED ORDER — SODIUM CHLORIDE 0.9 % IJ SOLN
10.0000 mL | INTRAMUSCULAR | Status: DC | PRN
  Administered 2015-03-27: 10 mL via INTRAVENOUS
  Filled 2015-03-27: qty 10

## 2015-03-27 NOTE — Progress Notes (Signed)
Patient states she is congested but is using vaporizer at home and it seems to be breaking it up.  Also complains of more right sided pain.  Requesting refills for oxycodone and emla.

## 2015-03-28 ENCOUNTER — Telehealth: Payer: Self-pay | Admitting: *Deleted

## 2015-03-28 MED ORDER — LEVOFLOXACIN 500 MG PO TABS: 500.0000 mg | ORAL_TABLET | Freq: Every day | ORAL | Status: DC

## 2015-03-28 NOTE — Telephone Encounter (Signed)
Per MD, pt needs to start levaquin '500mg'$  daily for 7 days. Callback if symptoms worsen. Rx has been sent to [facility] pharmacy.

## 2015-03-28 NOTE — Telephone Encounter (Signed)
Called patient and left message for patient to pick up levaquin for congestion.  She should take for 7 days and call if symptoms worsen.

## 2015-04-02 ENCOUNTER — Encounter: Payer: Self-pay | Admitting: Oncology

## 2015-04-02 NOTE — Progress Notes (Signed)
[place] @ [facility] [phone]  [phone]     Wendy Giles OB: 1932-09-21  MR#: [id number]  FGH#:[id number]  Patient Care Team: Tracie Harrier, MD as PCP - General (Internal Medicine) Isaias Cowman, MD as Consulting Physician (Cardiology)  CHIEF COMPLAINT:  Chief Complaint  Patient presents with  . OTHER       Carcinoma of breast                                AJCC Staging:  [id number]                               Stage Grouping: I                               Cancer Status: Evidence of disease.                               Estrogen receptor positive and                               progesterone receptor positive. HER- 2                               Negative. 2.  Abnormal CT scan of the chest (June, 2016) Oncology History   1.  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  Previous history of carcinoma breast stage I disease 3.  Started on chemotherapy with carboplatinum and VP-16 from November 20, 2014 MRI scan of brain was negative (July, 2016)   Patient was started on carboplatinum and VP-16 which has been discontinued in September of 2016 (received total of 3 cycles of chemotherapy) CT scan did not see any significant response 4, patient will be started on carboplatinum and CPT-11 from January 28, 2015  5.  Patient had severe diarrhea so carboplatinum CPT-11 has been discontinued patient has been started on Taxotere and gemcitabine combination from November of 2016  Oncology Flowsheet 01/28/2015 02/04/2015 02/10/2015 02/12/2015 02/13/2015 03/06/2015 03/13/2015  Day, Cycle Day 1, Cycle 1 Day 8, Cycle 1 - - - Day 1, Cycle 1 Day 8, Cycle 1  CARBOplatin (PARAPLATIN) IV 350 mg - - - - - -  dexamethasone (DECADRON) IV [ 12 mg ] [ 10 mg ] - - - [ 12 mg ] [ 10 mg ]  DOCEtaxel  (TAXOTERE) IV - - - - - 40 mg/m2 -  etoposide (VEPESID) IV - - - - - - -  fosaprepitant (EMEND) IV [ 150 mg ] - - - - [ 150 mg ] -  gemcitabine (GEMZAR) IV - - - - - 1,800 mg 1,800 mg  irinotecan (CAMPTOSAR) IV 65 mg/m2 65 mg/m2 - - - - -  octreotide (SANDOSTATIN LAR) IM - - - - 20 mg - -  octreotide (SANDOSTATIN) [place] - - - 200 mcg - - -  ondansetron (ZOFRAN) IV - [ 16 mg ] 4 mg - - - [ 8 mg ]  palonosetron (ALOXI) IV 0.25  mg - - - - 0.25 mg -  pegfilgrastim (NEULASTA ONPRO KIT) [place] - - - - - - -    INTERVAL HISTORY: 81 year old lady with 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  Since last evaluation she has been feeling stronger.  Right upper quadrant abdominal pain has improved.  No chills.  No fever.  Patient also reports that on 21st of November patient is supposed to get battery change in her pacemaker.. No nausea no vomiting appetite remains stable.   REVIEW OF SYSTEMS:    general status: Patient is feeling weak and tired.  No change in a performance status.  No chills.  No fever. HEENT: Alopecia.  No evidence of stomatitis Chest membranes are dry Lungs: No cough or shortness of breath Had thoracentesis and fluid was positive for small cell undifferentiated carcinoma of lung Cardiac: No chest pain or paroxysmal nocturnal dyspnea GI: Diarrhea has improved Skin: Poor skin turgor Lower extremity no swelling Neurological system: No tingling.  No numbness.  No other focal signs Musculoskeletal system no bony pains  Genitourinary system: No dysuria.  No hematuria.  No frequency.  No flank pain. Neurological system: No dizziness.  No tingling.  His was.  no tingling numbness.  no focal weakness or any focal signs. Depression has improved ve.  PAST MEDICAL HISTORY: Past Medical History  Diagnosis Date  . CHF (congestive heart failure) ([place])   . Diabetes  mellitus without complication ([place])   . Hypertension   . Presence of permanent cardiac pacemaker   . Dysrhythmia     afib  . Depression   . GERD (gastroesophageal reflux disease)   . Coronary artery disease   . Cancer ([place])     breast,uterine  . COPD (chronic obstructive pulmonary disease) ([place])   . Arthritis   . Anemia   . Anginal pain ([place])   . Cancer of upper lobe of right lung ([place]) 11/03/2014  . Breast cancer [facility])       Surgical History            Diabetes controlled with oral medication                               Previous history of congestive heart                               failure                               Coronary artery disease with stent                               placement and bypass surgery Hypertension                               Osteoarthritis                               Colon polyps  Chronic low back pain                                                              Past Surgical History:                               Coronary bypass surgery and stent                               placement                                Pacemaker because of atrial fibrillation                               Appendectomy                               Hemorrhoidectomy                                                              Family History:                               Family history of breast cancer and colon                               cancer. History of anemia, diabetes, heart                               disease, and hypertension in the family.                                                              Social History:                               Does not smoke now. Does not drink. Used                               to smoke in the pa ADVANCED DIRECTIVES:  No flowsheet data found.  HEALTH MAINTENANCE: Social History  Substance Use Topics  . Smoking status: Former Smoker    Quit date: 10/21/1997  . Smokeless  tobacco: Never Used  . Alcohol Use: No      Allergies  Allergen Reactions  . Amoxicillin-Pot Clavulanate Hives and Diarrhea  . Fentanyl Itching  .  Codeine Nausea And Vomiting and Other (See Comments)    Other Reaction: "very ill"  . Latex Rash  . Other Rash    Current Outpatient Prescriptions  Medication Sig Dispense Refill  . albuterol (PROAIR HFA) 108 (90 BASE) MCG/ACT inhaler Inhale into the lungs.    . ALPRAZolam (XANAX) 0.5 MG tablet Take 0.5 mg by mouth once a week.     Marland Kitchen aspirin 81 MG tablet Take 81 mg by mouth daily.     Marland Kitchen bismuth subsalicylate (KAOPECTATE) 262 MG/15ML suspension Take 30 mLs by mouth 4 (four) times daily. 360 mL 0  . clindamycin (CLEOCIN) 150 MG capsule Take 1 capsule (150 mg total) by mouth 3 (three) times daily. 30 capsule 0  . cyclobenzaprine (FLEXERIL) 5 MG tablet Take 5 mg by mouth daily as needed for muscle spasms.     . dabigatran (PRADAXA) 150 MG CAPS capsule TAKE ONE CAPSULE BY MOUTH TWICE DAILY    . dicyclomine (BENTYL) 10 MG capsule Take 10 mg by mouth 3 (three) times daily as needed (abdominal pain).     . fentaNYL (DURAGESIC - DOSED MCG/HR) 12 MCG/HR Place 1 patch (12.5 mcg total) onto the skin every 3 (three) days. 10 patch 0  . furosemide (LASIX) 40 MG tablet Take 40 mg by mouth 2 (two) times daily.     Marland Kitchen gabapentin (NEURONTIN) 100 MG capsule Take 100 mg by mouth every morning.     Marland Kitchen glipiZIDE (GLUCOTROL) 10 MG tablet Take 10 mg by mouth 2 (two) times daily before a meal.   0  . insulin lispro (HUMALOG) 100 UNIT/ML KiwkPen as per sliding scale< than 150-- No insulin150-200---4 units 201-250---6 units251-300--- 8 units301-350----10 units.351-400---12 units    . isosorbide mononitrate (IMDUR) 60 MG 24 hr tablet Take 60 mg by mouth daily.     Marland Kitchen levothyroxine (SYNTHROID, LEVOTHROID) 25 MCG tablet Take 25 mcg by mouth daily before breakfast.     . lidocaine-prilocaine (EMLA) cream Apply 1 application topically as needed. 30 g 3  . Liraglutide 18  MG/3ML SOPN Inject 3 mLs into the skin daily.     Marland Kitchen losartan (COZAAR) 100 MG tablet Take 100 mg by mouth daily.     . magic mouthwash w/lidocaine SOLN Take 5 mLs by mouth 4 (four) times daily as needed for mouth pain. 480 mL 3  . Magnesium-Calcium-Folic Acid 144-818-5 MG TABS Take 1 tablet by mouth daily.     . metFORMIN (GLUCOPHAGE) 1000 MG tablet Take 1,000 mg by mouth 2 (two) times daily.     . metoprolol-hydrochlorothiazide (LOPRESSOR HCT) 50-25 MG per tablet Take 1 tablet by mouth daily.     . metroNIDAZOLE (FLAGYL) 500 MG tablet Take 1 tablet (500 mg total) by mouth 3 (three) times daily. 21 tablet 0  . nitroGLYCERIN (NITROSTAT) 0.4 MG SL tablet Place 0.4 mg under the tongue every 5 (five) minutes as needed for chest pain.     Marland Kitchen ondansetron (ZOFRAN) 8 MG tablet Take 1 tablet (8 mg total) by mouth 2 (two) times daily. Start the day after chemo for 3 days. Then take as needed for nausea or vomiting. 30 tablet 2  . oxyCODONE (OXY IR/ROXICODONE) 5 MG immediate release tablet Take 1 tablet (5 mg total) by mouth every 4 (four) hours as needed for severe pain. 30 tablet 0  . potassium chloride (K-DUR) 10 MEQ tablet Take 2 tablets (20 mEq total) by mouth daily. 60 tablet 0  . promethazine (PHENERGAN) 25 MG tablet TAKE ONE  TABLET BY MOUTH EVERY SIX HOURSAS NEEDED FOR NAUSEA OR VOMITING 30 tablet 0  . RABEprazole (ACIPHEX) 20 MG tablet Take 20 mg by mouth daily.     . simvastatin (ZOCOR) 20 MG tablet Take 20 mg by mouth every evening.     Marland Kitchen levofloxacin (LEVAQUIN) 500 MG tablet Take 1 tablet (500 mg total) by mouth daily. 7 tablet 0   No current facility-administered medications for this visit.   Facility-Administered Medications Ordered in Other Visits  Medication Dose Route Frequency Provider Last Rate Last Dose  . heparin lock flush 100 unit/mL  500 Units Intravenous Once Forest Gleason, MD   500 Units at 11/20/14 1116  . sodium chloride 0.9 % injection 10 mL  10 mL Intravenous PRN Forest Gleason, MD    10 mL at 11/20/14 0916  . sodium chloride 0.9 % injection 10 mL  10 mL Intravenous PRN Forest Gleason, MD   10 mL at 01/21/15 0915    OBJECTIVE:  Filed Vitals:   03/27/15 1006  BP: 128/80  Pulse: 90  Temp: 97.7 F (36.5 C)     Body mass index is 28.4 kg/(m^2).    ECOG FS:1 - Symptomatic but completely ambulatory  PHYSICAL EXAM: General  status: Performance status is good.  Patient said clinical signs of dehydration  Skin turgor is poor.  Mucous membranes are dry. HEENT: No evidence of stomatitis. Sclera and conjunctivae :: No jaundice.   pale looking. Lungs: Air  entry equal on both sides.  No rhonchi.  No rales.  Cardiac: Heart sounds are normal.  No pericardial rub.  No murmur. There is some swelling above port.  Soft.  No fluid.  No redness or tenderness. Lymphatic system: Cervical, axillary, inguinal, lymph nodes not palpable GI: Abdomen is soft.  No ascites.  Liver liver is palpable.  Tenderness in left upper quadrant   No tenderness.  Bowel sounds are within normal limit Lower extremity: No edema Neurological system: Higher functions, cranial nerves intact no evidence of peripheral neuropathy.  Examination of both breasts.  Patient had down previous history of carcinoma of left breast no palpable masses axillary lymph nodes are normal.  Right breast free of masses.   All lab data has been reviewed  MEDICAL DECISION MAKING:   Diarrhea has resolved.tc Hypokalemia and hypomagnesemia secondary to diarrhea will start patient on IV magnesium and IV potassium is patient has poor tolerance to oral medication Cytology from pleural fluid (on February 14, 2015) positive for small cell undifferentiated carcinoma of lung   Patient has tolerated gemcitabine and Taxotere first cycle of chemotherapy very well.  With some improvement in right upper quadrant pain. At this point in time because patient is going to get   Battery  changed in the pacemaker will hold off chemotherapy for 1 week  patient will get her chemotherapy next week and will be evaluated weeks after that.  After total 3 cycles of chemotherapy and repeat PET scan or CT scan would be done for reassessment of the tumor. Patient complains of some congestion with acute bronchitis like picture., No antibiotics unless patient spikes fever or has a change in the color of the sputum. Continue pain management with oxycodone which is controlling pain     Forest Gleason, MD   04/02/2015 8:24 AM

## 2015-04-03 ENCOUNTER — Inpatient Hospital Stay: Payer: Medicare Other

## 2015-04-03 VITALS — BP 130/78 | HR 82 | Temp 97.5°F | Resp 18

## 2015-04-03 DIAGNOSIS — C3411 Malignant neoplasm of upper lobe, right bronchus or lung: Secondary | ICD-10-CM

## 2015-04-03 DIAGNOSIS — R918 Other nonspecific abnormal finding of lung field: Secondary | ICD-10-CM

## 2015-04-03 LAB — COMPREHENSIVE METABOLIC PANEL
ALT: 16 U/L (ref 14–54)
ANION GAP: 6 (ref 5–15)
AST: 32 U/L (ref 15–41)
Albumin: 3.2 g/dL — ABNORMAL LOW (ref 3.5–5.0)
Alkaline Phosphatase: 214 U/L — ABNORMAL HIGH (ref 38–126)
BUN: 20 mg/dL (ref 6–20)
CHLORIDE: 103 mmol/L (ref 101–111)
CO2: 28 mmol/L (ref 22–32)
CREATININE: 0.87 mg/dL (ref 0.44–1.00)
Calcium: 8.2 mg/dL — ABNORMAL LOW (ref 8.9–10.3)
GFR calc non Af Amer: >60 mL/min (ref >60)
Glucose, Bld: 412 mg/dL — ABNORMAL HIGH (ref 65–99)
Potassium: 3.3 mmol/L — ABNORMAL LOW (ref 3.5–5.1)
SODIUM: 137 mmol/L (ref 135–145)
Total Bilirubin: 0.6 mg/dL (ref 0.3–1.2)
Total Protein: 6.1 g/dL — ABNORMAL LOW (ref 6.5–8.1)

## 2015-04-03 LAB — CBC WITH DIFFERENTIAL/PLATELET
BASOS PCT: 1 %
Basophils Absolute: 0 10*3/uL (ref 0–0.1)
EOS ABS: 0 10*3/uL (ref 0–0.7)
EOS PCT: 1 %
HCT: 33 % — ABNORMAL LOW (ref 35.0–47.0)
Hemoglobin: 10.7 g/dL — ABNORMAL LOW (ref 12.0–16.0)
LYMPHS ABS: 0.9 10*3/uL — AB (ref 1.0–3.6)
Lymphocytes Relative: 17 %
MCH: 31 pg (ref 26.0–34.0)
MCHC: 32.5 g/dL (ref 32.0–36.0)
MCV: 95.3 fL (ref 80.0–100.0)
Monocytes Absolute: 0.6 10*3/uL (ref 0.2–0.9)
Monocytes Relative: 12 %
Neutro Abs: 3.7 10*3/uL (ref 1.4–6.5)
Neutrophils Relative %: 69 %
PLATELETS: 304 10*3/uL (ref 150–440)
RBC: 3.46 MIL/uL — AB (ref 3.80–5.20)
RDW: 18.4 % — ABNORMAL HIGH (ref 11.5–14.5)
WBC: 5.3 10*3/uL (ref 3.6–11.0)

## 2015-04-03 MED ORDER — DOCETAXEL CHEMO INJECTION 160 MG/16ML
60.0000 mg/m2 | Freq: Once | INTRAVENOUS | Status: AC
  Administered 2015-04-03: 100 mg via INTRAVENOUS
  Filled 2015-04-03: qty 10

## 2015-04-03 MED ORDER — GEMCITABINE HCL CHEMO INJECTION 1 GM/26.3ML
1800.0000 mg | Freq: Once | INTRAVENOUS | Status: AC
  Administered 2015-04-03: 1800 mg via INTRAVENOUS
  Filled 2015-04-03: qty 10.52

## 2015-04-03 MED ORDER — HEPARIN SOD (PORK) LOCK FLUSH 100 UNIT/ML IV SOLN
500.0000 [IU] | Freq: Once | INTRAVENOUS | Status: AC | PRN
  Administered 2015-04-03: 500 [IU]
  Filled 2015-04-03 (×2): qty 5

## 2015-04-03 MED ORDER — SODIUM CHLORIDE 0.9 % IV SOLN
Freq: Once | INTRAVENOUS | Status: AC
  Administered 2015-04-03: 12:00:00 via INTRAVENOUS
  Filled 2015-04-03: qty 5

## 2015-04-03 MED ORDER — FAMOTIDINE IN NACL 20-0.9 MG/50ML-% IV SOLN
20.0000 mg | Freq: Two times a day (BID) | INTRAVENOUS | Status: DC
  Administered 2015-04-03: 20 mg via INTRAVENOUS
  Filled 2015-04-03: qty 50

## 2015-04-03 MED ORDER — SODIUM CHLORIDE 0.9 % IJ SOLN
10.0000 mL | INTRAMUSCULAR | Status: DC | PRN
  Administered 2015-04-03 (×2): 10 mL
  Filled 2015-04-03: qty 10

## 2015-04-03 MED ORDER — PALONOSETRON HCL INJECTION 0.25 MG/5ML
0.2500 mg | Freq: Once | INTRAVENOUS | Status: AC
  Administered 2015-04-03: 0.25 mg via INTRAVENOUS
  Filled 2015-04-03: qty 5

## 2015-04-03 MED ORDER — SODIUM CHLORIDE 0.9 % IV SOLN
Freq: Once | INTRAVENOUS | Status: AC
  Administered 2015-04-03: 11:00:00 via INTRAVENOUS
  Filled 2015-04-03: qty 1000

## 2015-04-03 MED ORDER — DIPHENHYDRAMINE HCL 50 MG/ML IJ SOLN
25.0000 mg | Freq: Once | INTRAMUSCULAR | Status: AC
  Administered 2015-04-03: 25 mg via INTRAVENOUS
  Filled 2015-04-03: qty 1

## 2015-04-10 ENCOUNTER — Inpatient Hospital Stay (HOSPITAL_BASED_OUTPATIENT_CLINIC_OR_DEPARTMENT_OTHER): Payer: Medicare Other | Admitting: Oncology

## 2015-04-10 ENCOUNTER — Inpatient Hospital Stay: Payer: Medicare Other

## 2015-04-10 ENCOUNTER — Encounter: Payer: Self-pay | Admitting: Oncology

## 2015-04-10 VITALS — BP 153/81 | HR 83 | Temp 97.7°F | Wt 159.8 lb

## 2015-04-10 DIAGNOSIS — C3411 Malignant neoplasm of upper lobe, right bronchus or lung: Secondary | ICD-10-CM

## 2015-04-10 DIAGNOSIS — I209 Angina pectoris, unspecified: Secondary | ICD-10-CM

## 2015-04-10 DIAGNOSIS — R5383 Other fatigue: Secondary | ICD-10-CM

## 2015-04-10 DIAGNOSIS — M545 Low back pain: Secondary | ICD-10-CM

## 2015-04-10 DIAGNOSIS — G8929 Other chronic pain: Secondary | ICD-10-CM

## 2015-04-10 DIAGNOSIS — E876 Hypokalemia: Secondary | ICD-10-CM

## 2015-04-10 DIAGNOSIS — R110 Nausea: Secondary | ICD-10-CM | POA: Diagnosis not present

## 2015-04-10 DIAGNOSIS — K219 Gastro-esophageal reflux disease without esophagitis: Secondary | ICD-10-CM

## 2015-04-10 DIAGNOSIS — F329 Major depressive disorder, single episode, unspecified: Secondary | ICD-10-CM

## 2015-04-10 DIAGNOSIS — Z7984 Long term (current) use of oral hypoglycemic drugs: Secondary | ICD-10-CM

## 2015-04-10 DIAGNOSIS — C787 Secondary malignant neoplasm of liver and intrahepatic bile duct: Secondary | ICD-10-CM

## 2015-04-10 DIAGNOSIS — I4891 Unspecified atrial fibrillation: Secondary | ICD-10-CM

## 2015-04-10 DIAGNOSIS — R197 Diarrhea, unspecified: Secondary | ICD-10-CM

## 2015-04-10 DIAGNOSIS — Z79899 Other long term (current) drug therapy: Secondary | ICD-10-CM

## 2015-04-10 DIAGNOSIS — I509 Heart failure, unspecified: Secondary | ICD-10-CM

## 2015-04-10 DIAGNOSIS — Z8542 Personal history of malignant neoplasm of other parts of uterus: Secondary | ICD-10-CM

## 2015-04-10 DIAGNOSIS — Z794 Long term (current) use of insulin: Secondary | ICD-10-CM

## 2015-04-10 DIAGNOSIS — M1990 Unspecified osteoarthritis, unspecified site: Secondary | ICD-10-CM

## 2015-04-10 DIAGNOSIS — Z86010 Personal history of colonic polyps: Secondary | ICD-10-CM

## 2015-04-10 DIAGNOSIS — E119 Type 2 diabetes mellitus without complications: Secondary | ICD-10-CM

## 2015-04-10 DIAGNOSIS — Z803 Family history of malignant neoplasm of breast: Secondary | ICD-10-CM

## 2015-04-10 DIAGNOSIS — Z955 Presence of coronary angioplasty implant and graft: Secondary | ICD-10-CM

## 2015-04-10 DIAGNOSIS — Z950 Presence of cardiac pacemaker: Secondary | ICD-10-CM

## 2015-04-10 DIAGNOSIS — E860 Dehydration: Secondary | ICD-10-CM

## 2015-04-10 DIAGNOSIS — M129 Arthropathy, unspecified: Secondary | ICD-10-CM

## 2015-04-10 DIAGNOSIS — I2510 Atherosclerotic heart disease of native coronary artery without angina pectoris: Secondary | ICD-10-CM

## 2015-04-10 DIAGNOSIS — Z87891 Personal history of nicotine dependence: Secondary | ICD-10-CM

## 2015-04-10 DIAGNOSIS — Z7982 Long term (current) use of aspirin: Secondary | ICD-10-CM

## 2015-04-10 DIAGNOSIS — D649 Anemia, unspecified: Secondary | ICD-10-CM

## 2015-04-10 DIAGNOSIS — J449 Chronic obstructive pulmonary disease, unspecified: Secondary | ICD-10-CM

## 2015-04-10 DIAGNOSIS — I10 Essential (primary) hypertension: Secondary | ICD-10-CM

## 2015-04-10 DIAGNOSIS — R531 Weakness: Secondary | ICD-10-CM

## 2015-04-10 DIAGNOSIS — Z853 Personal history of malignant neoplasm of breast: Secondary | ICD-10-CM

## 2015-04-10 DIAGNOSIS — Z800 Family history of malignant neoplasm of digestive organs: Secondary | ICD-10-CM

## 2015-04-10 LAB — CBC WITH DIFFERENTIAL/PLATELET
Basophils Absolute: 0 10*3/uL (ref 0–0.1)
Basophils Relative: 2 %
EOS PCT: 4 %
Eosinophils Absolute: 0.1 10*3/uL (ref 0–0.7)
HEMATOCRIT: 30.8 % — AB (ref 35.0–47.0)
Hemoglobin: 10 g/dL — ABNORMAL LOW (ref 12.0–16.0)
LYMPHS ABS: 0.6 10*3/uL — AB (ref 1.0–3.6)
LYMPHS PCT: 32 %
MCH: 30.4 pg (ref 26.0–34.0)
MCHC: 32.4 g/dL (ref 32.0–36.0)
MCV: 93.8 fL (ref 80.0–100.0)
MONO ABS: 0.3 10*3/uL (ref 0.2–0.9)
MONOS PCT: 14 %
NEUTROS ABS: 0.9 10*3/uL — AB (ref 1.4–6.5)
Neutrophils Relative %: 48 %
PLATELETS: 105 10*3/uL — AB (ref 150–440)
RBC: 3.29 MIL/uL — ABNORMAL LOW (ref 3.80–5.20)
RDW: 18.6 % — AB (ref 11.5–14.5)
WBC: 1.8 10*3/uL — ABNORMAL LOW (ref 3.6–11.0)

## 2015-04-10 MED ORDER — SODIUM CHLORIDE 0.9 % IJ SOLN
10.0000 mL | Freq: Once | INTRAMUSCULAR | Status: AC
  Administered 2015-04-10: 10 mL via INTRAVENOUS
  Filled 2015-04-10: qty 10

## 2015-04-10 MED ORDER — HEPARIN SOD (PORK) LOCK FLUSH 100 UNIT/ML IV SOLN
500.0000 [IU] | Freq: Once | INTRAVENOUS | Status: AC
  Administered 2015-04-10: 500 [IU] via INTRAVENOUS
  Filled 2015-04-10: qty 5

## 2015-04-10 MED ORDER — FENTANYL 12 MCG/HR TD PT72: 12.5000 ug | MEDICATED_PATCH | TRANSDERMAL | Status: DC

## 2015-04-10 NOTE — Progress Notes (Signed)
[place] @ [facility] Telephone:(336) [phone]  Fax:(336) [phone]     Wendy Giles OB: August 26, 1932  MR#: [id number]  TKZ#:[id number]  Patient Care Team: Tracie Harrier, MD as PCP - General (Internal Medicine) Isaias Cowman, MD as Consulting Physician (Cardiology)  CHIEF COMPLAINT:  Chief Complaint  Patient presents with  . Lung Cancer       Carcinoma of breast                                AJCC Staging:  [id number]                               Stage Grouping: I                               Cancer Status: Evidence of disease.                               Estrogen receptor positive and                               progesterone receptor positive. HER- 2                               Negative. 2.  Abnormal CT scan of the chest (June, 2016) Oncology History   1.  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  Previous history of carcinoma breast stage I disease 3.  Started on chemotherapy with carboplatinum and VP-16 from November 20, 2014 MRI scan of brain was negative (July, 2016)   Patient was started on carboplatinum and VP-16 which has been discontinued in September of 2016 (received total of 3 cycles of chemotherapy) CT scan did not see any significant response 4, patient will be started on carboplatinum and CPT-11 from January 28, 2015  5.  Patient had severe diarrhea so carboplatinum CPT-11 has been discontinued patient has been started on Taxotere and gemcitabine combination from November of 2016  Oncology Flowsheet 02/04/2015 02/10/2015 02/12/2015 02/13/2015 03/06/2015 03/13/2015 04/03/2015  Day, Cycle Day 8, Cycle 1 - - - Day 1, Cycle 1 Day 8, Cycle 1 Day 1, Cycle 2  CARBOplatin (PARAPLATIN) IV - - - - - - -  dexamethasone (DECADRON) IV [ 10 mg ] - - - [ 12 mg ] [ 10 mg ] [ 12 mg ]  DOCEtaxel  (TAXOTERE) IV - - - - 40 mg/m2 - 60 mg/m2  etoposide (VEPESID) IV - - - - - - -  fosaprepitant (EMEND) IV - - - - [ 150 mg ] - [ 150 mg ]  gemcitabine (GEMZAR) IV - - - - 1,800 mg 1,800 mg 1,800 mg  irinotecan (CAMPTOSAR) IV 65 mg/m2 - - - - - -  octreotide (SANDOSTATIN LAR) IM - - - 20 mg - - -  octreotide (SANDOSTATIN) [place] - - 200 mcg - - - -  ondansetron (ZOFRAN) IV [ 16 mg ] 4 mg - - - [ 8 mg ] -  palonosetron (ALOXI) IV - - - -  0.25 mg - 0.25 mg  pegfilgrastim (NEULASTA ONPRO KIT) [place] - - - - - - -    INTERVAL HISTORY: 81 year old lady with 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  Since last evaluation she has been feeling stronger.  Right upper quadrant abdominal pain has improved.  No chills.  No fever.  Patient also reports that on 21st of November patient is supposed to get battery change in her pacemaker.. No nausea no vomiting appetite remains stable. Patient received chemotherapy on 23rd of November.  Here for further follow-up regarding day 8 gemcitabine.  No chills no fever.  Appetite has been stable.  Abdominal pain is improved   REVIEW OF SYSTEMS:    general status: Patient is feeling weak and tired.  No change in a performance status.  No chills.  No fever. HEENT: Alopecia.  No evidence of stomatitis Chest membranes are dry Lungs: No cough or shortness of breath Had thoracentesis and fluid was positive for small cell undifferentiated carcinoma of lung Cardiac: No chest pain or paroxysmal nocturnal dyspnea GI: Diarrhea has improved Skin: Poor skin turgor Lower extremity no swelling Neurological system: No tingling.  No numbness.  No other focal signs Musculoskeletal system no bony pains  Genitourinary system: No dysuria.  No hematuria.  No frequency.  No flank pain. Neurological system: No dizziness.  No tingling.  His was.  no tingling  numbness.  no focal weakness or any focal signs. Depression has improved ve.  PAST MEDICAL HISTORY: Past Medical History  Diagnosis Date  . CHF (congestive heart failure) ([place])   . Diabetes mellitus without complication ([place])   . Hypertension   . Presence of permanent cardiac pacemaker   . Dysrhythmia     afib  . Depression   . GERD (gastroesophageal reflux disease)   . Coronary artery disease   . Cancer ([place])     breast,uterine  . COPD (chronic obstructive pulmonary disease) ([place])   . Arthritis   . Anemia   . Anginal pain ([place])   . Cancer of upper lobe of right lung ([place]) 11/03/2014  . Breast cancer [facility])       Surgical History            Diabetes controlled with oral medication                               Previous history of congestive heart                               failure                               Coronary artery disease with stent                               placement and bypass surgery Hypertension                               Osteoarthritis  Colon polyps                               Chronic low back pain                                                              Past Surgical History:                               Coronary bypass surgery and stent                               placement                                Pacemaker because of atrial fibrillation                               Appendectomy                               Hemorrhoidectomy                                                              Family History:                               Family history of breast cancer and colon                               cancer. History of anemia, diabetes, heart                               disease, and hypertension in the family.                                                              Social History:                               Does not smoke now. Does not drink. Used                               to smoke  in the pa ADVANCED DIRECTIVES:  No flowsheet data found.  HEALTH MAINTENANCE: Social History  Substance Use Topics  . Smoking status: Former Smoker    Quit date:  10/21/1997  . Smokeless tobacco: Never Used  . Alcohol Use: No      Allergies  Allergen Reactions  . Amoxicillin-Pot Clavulanate Hives and Diarrhea  . Fentanyl Itching  . Codeine Nausea And Vomiting and Other (See Comments)    Other Reaction: "very ill"  . Latex Rash  . Other Rash    Current Outpatient Prescriptions  Medication Sig Dispense Refill  . albuterol (PROAIR HFA) 108 (90 BASE) MCG/ACT inhaler Inhale into the lungs.    . ALPRAZolam (XANAX) 0.5 MG tablet Take 0.5 mg by mouth once a week.     Marland Kitchen aspirin 81 MG tablet Take 81 mg by mouth daily.     Marland Kitchen bismuth subsalicylate (KAOPECTATE) 262 MG/15ML suspension Take 30 mLs by mouth 4 (four) times daily. 360 mL 0  . cyclobenzaprine (FLEXERIL) 5 MG tablet Take 5 mg by mouth daily as needed for muscle spasms.     . dabigatran (PRADAXA) 150 MG CAPS capsule TAKE ONE CAPSULE BY MOUTH TWICE DAILY    . dicyclomine (BENTYL) 10 MG capsule Take 10 mg by mouth 3 (three) times daily as needed (abdominal pain).     . fentaNYL (DURAGESIC - DOSED MCG/HR) 12 MCG/HR Place 1 patch (12.5 mcg total) onto the skin every 3 (three) days. 10 patch 0  . furosemide (LASIX) 40 MG tablet Take 40 mg by mouth 2 (two) times daily.     Marland Kitchen gabapentin (NEURONTIN) 100 MG capsule Take 100 mg by mouth every morning.     Marland Kitchen glipiZIDE (GLUCOTROL) 10 MG tablet Take 10 mg by mouth 2 (two) times daily before a meal.   0  . insulin lispro (HUMALOG) 100 UNIT/ML KiwkPen as per sliding scale< than 150-- No insulin150-200---4 units 201-250---6 units251-300--- 8 units301-350----10 units.351-400---12 units    . isosorbide mononitrate (IMDUR) 60 MG 24 hr tablet Take 60 mg by mouth daily.     Marland Kitchen levofloxacin (LEVAQUIN) 500 MG tablet Take 1 tablet (500 mg total) by mouth daily. 7 tablet 0  . levothyroxine (SYNTHROID,  LEVOTHROID) 25 MCG tablet Take 25 mcg by mouth daily before breakfast.     . lidocaine-prilocaine (EMLA) cream Apply 1 application topically as needed. 30 g 3  . Liraglutide 18 MG/3ML SOPN Inject 3 mLs into the skin daily.     Marland Kitchen losartan (COZAAR) 100 MG tablet Take 100 mg by mouth daily.     . magic mouthwash w/lidocaine SOLN Take 5 mLs by mouth 4 (four) times daily as needed for mouth pain. 480 mL 3  . Magnesium-Calcium-Folic Acid 353-614-4 MG TABS Take 1 tablet by mouth daily.     . metFORMIN (GLUCOPHAGE) 1000 MG tablet Take 1,000 mg by mouth 2 (two) times daily.     . metoprolol-hydrochlorothiazide (LOPRESSOR HCT) 50-25 MG per tablet Take 1 tablet by mouth daily.     . metroNIDAZOLE (FLAGYL) 500 MG tablet Take 1 tablet (500 mg total) by mouth 3 (three) times daily. 21 tablet 0  . nitroGLYCERIN (NITROSTAT) 0.4 MG SL tablet Place 0.4 mg under the tongue every 5 (five) minutes as needed for chest pain.     Marland Kitchen ondansetron (ZOFRAN) 8 MG tablet Take 1 tablet (8 mg total) by mouth 2 (two) times daily. Start the day after chemo for 3 days. Then take as needed for nausea or vomiting. 30 tablet 2  . oxyCODONE (OXY IR/ROXICODONE) 5 MG immediate release tablet Take 1 tablet (5 mg total) by mouth every 4 (four) hours as needed for severe pain. [place]  tablet 0  . potassium chloride (K-DUR) 10 MEQ tablet Take 2 tablets (20 mEq total) by mouth daily. 60 tablet 0  . promethazine (PHENERGAN) 25 MG tablet TAKE ONE TABLET BY MOUTH EVERY SIX HOURSAS NEEDED FOR NAUSEA OR VOMITING 30 tablet 0  . RABEprazole (ACIPHEX) 20 MG tablet Take 20 mg by mouth daily.     . simvastatin (ZOCOR) 20 MG tablet Take 20 mg by mouth every evening.      No current facility-administered medications for this visit.   Facility-Administered Medications Ordered in Other Visits  Medication Dose Route Frequency Provider Last Rate Last Dose  . heparin lock flush 100 unit/mL  500 Units Intravenous Once Forest Gleason, MD   500 Units at 11/20/14 1116    . sodium chloride 0.9 % injection 10 mL  10 mL Intravenous PRN Forest Gleason, MD   10 mL at 11/20/14 0916  . sodium chloride 0.9 % injection 10 mL  10 mL Intravenous PRN Forest Gleason, MD   10 mL at 01/21/15 0915    OBJECTIVE:  Filed Vitals:   04/10/15 1011  BP: 153/81  Pulse: 83  Temp: 97.7 F (36.5 C)     Body mass index is 28.32 kg/(m^2).    ECOG FS:1 - Symptomatic but completely ambulatory  PHYSICAL EXAM: General  status: Performance status is good.  Patient said clinical signs of dehydration  Skin turgor is poor.  Mucous membranes are dry. HEENT: No evidence of stomatitis. Sclera and conjunctivae :: No jaundice.   pale looking. Lungs: Air  entry equal on both sides.  No rhonchi.  No rales.  Cardiac: Heart sounds are normal.  No pericardial rub.  No murmur. There is some swelling above port.  Soft.  No fluid.  No redness or tenderness. Lymphatic system: Cervical, axillary, inguinal, lymph nodes not palpable GI: Abdomen is soft.  No ascites.  Liver liver is palpable.  Tenderness in left upper quadrant   No tenderness.  Bowel sounds are within normal limit Lower extremity: No edema Neurological system: Higher functions, cranial nerves intact no evidence of peripheral neuropathy.  Examination of both breasts.  Patient had down previous history of carcinoma of left breast no palpable masses axillary lymph nodes are normal.  Right breast free of masses.   All lab data has been reviewed  MEDICAL DECISION MAKING:   Diarrhea has resolved.tc Hypokalemia and hypomagnesemia secondary to diarrhea will start patient on IV magnesium and IV potassium is patient has poor tolerance to oral medication Cytology from pleural fluid (on February 14, 2015) positive for small cell undifferentiated carcinoma of lung   Asian is tolerating chemotherapy very well. Patient  is myelosuppressed secondary to chemotherapy. Was advised to call me if spikes fever more than 100.  Or any chills or fever.  Hold  chemotherapy Reevaluate patient next week any blood count is improved and consider gemcitabine and Taxotere chemotherapy followed by evaluation with CT scan to assess the response     Forest Gleason, MD   04/10/2015 11:18 AM

## 2015-04-10 NOTE — Progress Notes (Signed)
Patient requesting refill for Fentanyl 12 mcg.

## 2015-04-17 ENCOUNTER — Encounter: Payer: Self-pay | Admitting: Oncology

## 2015-04-17 ENCOUNTER — Inpatient Hospital Stay (HOSPITAL_BASED_OUTPATIENT_CLINIC_OR_DEPARTMENT_OTHER): Payer: Medicare Other | Admitting: Oncology

## 2015-04-17 ENCOUNTER — Inpatient Hospital Stay: Payer: Medicare Other

## 2015-04-17 ENCOUNTER — Inpatient Hospital Stay: Payer: Medicare Other | Attending: Oncology

## 2015-04-17 VITALS — BP 163/93 | HR 90 | Temp 97.5°F | Resp 18 | Wt 156.3 lb

## 2015-04-17 DIAGNOSIS — C787 Secondary malignant neoplasm of liver and intrahepatic bile duct: Secondary | ICD-10-CM | POA: Diagnosis not present

## 2015-04-17 DIAGNOSIS — R5383 Other fatigue: Secondary | ICD-10-CM | POA: Diagnosis not present

## 2015-04-17 DIAGNOSIS — Z803 Family history of malignant neoplasm of breast: Secondary | ICD-10-CM

## 2015-04-17 DIAGNOSIS — Z79899 Other long term (current) drug therapy: Secondary | ICD-10-CM

## 2015-04-17 DIAGNOSIS — Z87891 Personal history of nicotine dependence: Secondary | ICD-10-CM

## 2015-04-17 DIAGNOSIS — Z800 Family history of malignant neoplasm of digestive organs: Secondary | ICD-10-CM | POA: Insufficient documentation

## 2015-04-17 DIAGNOSIS — I10 Essential (primary) hypertension: Secondary | ICD-10-CM | POA: Insufficient documentation

## 2015-04-17 DIAGNOSIS — Z170 Estrogen receptor positive status [ER+]: Secondary | ICD-10-CM

## 2015-04-17 DIAGNOSIS — Z8542 Personal history of malignant neoplasm of other parts of uterus: Secondary | ICD-10-CM | POA: Insufficient documentation

## 2015-04-17 DIAGNOSIS — Z86010 Personal history of colonic polyps: Secondary | ICD-10-CM

## 2015-04-17 DIAGNOSIS — M545 Low back pain: Secondary | ICD-10-CM | POA: Insufficient documentation

## 2015-04-17 DIAGNOSIS — G8929 Other chronic pain: Secondary | ICD-10-CM | POA: Diagnosis not present

## 2015-04-17 DIAGNOSIS — F329 Major depressive disorder, single episode, unspecified: Secondary | ICD-10-CM | POA: Diagnosis not present

## 2015-04-17 DIAGNOSIS — E119 Type 2 diabetes mellitus without complications: Secondary | ICD-10-CM | POA: Diagnosis not present

## 2015-04-17 DIAGNOSIS — R531 Weakness: Secondary | ICD-10-CM | POA: Diagnosis not present

## 2015-04-17 DIAGNOSIS — Z950 Presence of cardiac pacemaker: Secondary | ICD-10-CM | POA: Insufficient documentation

## 2015-04-17 DIAGNOSIS — Z5111 Encounter for antineoplastic chemotherapy: Secondary | ICD-10-CM | POA: Diagnosis not present

## 2015-04-17 DIAGNOSIS — Z7984 Long term (current) use of oral hypoglycemic drugs: Secondary | ICD-10-CM | POA: Diagnosis not present

## 2015-04-17 DIAGNOSIS — Z794 Long term (current) use of insulin: Secondary | ICD-10-CM | POA: Insufficient documentation

## 2015-04-17 DIAGNOSIS — M1990 Unspecified osteoarthritis, unspecified site: Secondary | ICD-10-CM | POA: Diagnosis not present

## 2015-04-17 DIAGNOSIS — I2510 Atherosclerotic heart disease of native coronary artery without angina pectoris: Secondary | ICD-10-CM | POA: Diagnosis not present

## 2015-04-17 DIAGNOSIS — Z7982 Long term (current) use of aspirin: Secondary | ICD-10-CM

## 2015-04-17 DIAGNOSIS — Z853 Personal history of malignant neoplasm of breast: Secondary | ICD-10-CM

## 2015-04-17 DIAGNOSIS — K219 Gastro-esophageal reflux disease without esophagitis: Secondary | ICD-10-CM | POA: Insufficient documentation

## 2015-04-17 DIAGNOSIS — J449 Chronic obstructive pulmonary disease, unspecified: Secondary | ICD-10-CM | POA: Diagnosis not present

## 2015-04-17 DIAGNOSIS — I4891 Unspecified atrial fibrillation: Secondary | ICD-10-CM | POA: Insufficient documentation

## 2015-04-17 DIAGNOSIS — Z8679 Personal history of other diseases of the circulatory system: Secondary | ICD-10-CM | POA: Diagnosis not present

## 2015-04-17 DIAGNOSIS — C3411 Malignant neoplasm of upper lobe, right bronchus or lung: Secondary | ICD-10-CM | POA: Insufficient documentation

## 2015-04-17 DIAGNOSIS — Z862 Personal history of diseases of the blood and blood-forming organs and certain disorders involving the immune mechanism: Secondary | ICD-10-CM

## 2015-04-17 DIAGNOSIS — R197 Diarrhea, unspecified: Secondary | ICD-10-CM | POA: Insufficient documentation

## 2015-04-17 DIAGNOSIS — M129 Arthropathy, unspecified: Secondary | ICD-10-CM

## 2015-04-17 DIAGNOSIS — R918 Other nonspecific abnormal finding of lung field: Secondary | ICD-10-CM

## 2015-04-17 LAB — CBC WITH DIFFERENTIAL/PLATELET
Basophils Absolute: 0 10*3/uL (ref 0–0.1)
Basophils Relative: 1 %
EOS PCT: 0 %
Eosinophils Absolute: 0 10*3/uL (ref 0–0.7)
HEMATOCRIT: 33.7 % — AB (ref 35.0–47.0)
Hemoglobin: 10.8 g/dL — ABNORMAL LOW (ref 12.0–16.0)
LYMPHS ABS: 0.9 10*3/uL — AB (ref 1.0–3.6)
LYMPHS PCT: 21 %
MCH: 29.8 pg (ref 26.0–34.0)
MCHC: 32.2 g/dL (ref 32.0–36.0)
MCV: 92.6 fL (ref 80.0–100.0)
MONO ABS: 0.8 10*3/uL (ref 0.2–0.9)
Monocytes Relative: 19 %
NEUTROS ABS: 2.5 10*3/uL (ref 1.4–6.5)
Neutrophils Relative %: 59 %
PLATELETS: 289 10*3/uL (ref 150–440)
RBC: 3.64 MIL/uL — AB (ref 3.80–5.20)
RDW: 19 % — AB (ref 11.5–14.5)
WBC: 4.3 10*3/uL (ref 3.6–11.0)

## 2015-04-17 LAB — COMPREHENSIVE METABOLIC PANEL
ALBUMIN: 3.2 g/dL — AB (ref 3.5–5.0)
ALT: 14 U/L (ref 14–54)
AST: 29 U/L (ref 15–41)
Alkaline Phosphatase: 234 U/L — ABNORMAL HIGH (ref 38–126)
Anion gap: 6 (ref 5–15)
BUN: 22 mg/dL — AB (ref 6–20)
CHLORIDE: 103 mmol/L (ref 101–111)
CO2: 25 mmol/L (ref 22–32)
Calcium: 7.8 mg/dL — ABNORMAL LOW (ref 8.9–10.3)
Creatinine, Ser: 0.84 mg/dL (ref 0.44–1.00)
GFR calc Af Amer: >60 mL/min (ref >60)
GFR calc non Af Amer: >60 mL/min (ref >60)
GLUCOSE: 239 mg/dL — AB (ref 65–99)
POTASSIUM: 3.5 mmol/L (ref 3.5–5.1)
SODIUM: 134 mmol/L — AB (ref 135–145)
Total Bilirubin: 0.6 mg/dL (ref 0.3–1.2)
Total Protein: 6 g/dL — ABNORMAL LOW (ref 6.5–8.1)

## 2015-04-17 MED ORDER — SODIUM CHLORIDE 0.9 % IV SOLN
Freq: Once | INTRAVENOUS | Status: AC
  Administered 2015-04-17: 11:00:00 via INTRAVENOUS
  Filled 2015-04-17: qty 1000

## 2015-04-17 MED ORDER — HEPARIN SOD (PORK) LOCK FLUSH 100 UNIT/ML IV SOLN
500.0000 [IU] | Freq: Once | INTRAVENOUS | Status: AC | PRN
Start: 2015-04-17 — End: 2015-04-17
  Administered 2015-04-17: 500 [IU]

## 2015-04-17 MED ORDER — SODIUM CHLORIDE 0.9 % IJ SOLN
10.0000 mL | INTRAMUSCULAR | Status: DC | PRN
  Administered 2015-04-17: 10 mL
  Filled 2015-04-17: qty 10

## 2015-04-17 MED ORDER — SODIUM CHLORIDE 0.9 % IV SOLN
1800.0000 mg | Freq: Once | INTRAVENOUS | Status: AC
  Administered 2015-04-17: 1800 mg via INTRAVENOUS
  Filled 2015-04-17: qty 42.08

## 2015-04-17 MED ORDER — SODIUM CHLORIDE 0.9 % IV SOLN
Freq: Once | INTRAVENOUS | Status: AC
  Administered 2015-04-17: 11:00:00 via INTRAVENOUS
  Filled 2015-04-17: qty 4

## 2015-04-17 MED ORDER — OXYCODONE HCL 5 MG PO TABS: 5.0000 mg | ORAL_TABLET | ORAL | Status: DC | PRN

## 2015-04-17 MED ORDER — HEPARIN SOD (PORK) LOCK FLUSH 100 UNIT/ML IV SOLN
INTRAVENOUS | Status: AC
  Filled 2015-04-17: qty 5

## 2015-04-17 NOTE — Progress Notes (Signed)
[place] @ [facility] Telephone:(336) [phone]  Fax:(336) [phone]     Wendy Giles OB: November 06, 1932  MR#: [id number]  WLN#:[id number]  Patient Care Team: Tracie Harrier, MD as PCP - General (Internal Medicine) Isaias Cowman, MD as Consulting Physician (Cardiology)  CHIEF COMPLAINT:  Chief Complaint  Patient presents with  . Lung Cancer       Carcinoma of breast                                AJCC Staging:  [id number]_                               Stage Grouping: I                               Cancer Status: Evidence of disease.                               Estrogen receptor positive and                               progesterone receptor positive. HER- 2                               Negative. 2.  Abnormal CT scan of the chest (June, 2016) Oncology History   1.  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  Previous history of carcinoma breast stage I disease 3.  Started on chemotherapy with carboplatinum and VP-16 from November 20, 2014 MRI scan of brain was negative (July, 2016)   Patient was started on carboplatinum and VP-16 which has been discontinued in September of 2016 (received total of 3 cycles of chemotherapy) CT scan did not see any significant response 4, patient will be started on carboplatinum and CPT-11 from January 28, 2015  5.  Patient had severe diarrhea so carboplatinum CPT-11 has been discontinued patient has been started on Taxotere and gemcitabine combination from November of 2016  Oncology Flowsheet 02/04/2015 02/10/2015 02/12/2015 02/13/2015 03/06/2015 03/13/2015 04/03/2015  Day, Cycle Day 8, Cycle 1 - - - Day 1, Cycle 1 Day 8, Cycle 1 Day 1, Cycle 2  CARBOplatin (PARAPLATIN) IV - - - - - - -  dexamethasone (DECADRON) IV [ 10 mg ] - - - [ 12 mg ] [ 10 mg ] [ 12 mg ]  DOCEtaxel  (TAXOTERE) IV - - - - 40 mg/m2 - 60 mg/m2  etoposide (VEPESID) IV - - - - - - -  fosaprepitant (EMEND) IV - - - - [ 150 mg ] - [ 150 mg ]  gemcitabine (GEMZAR) IV - - - - 1,800 mg 1,800 mg 1,800 mg  irinotecan (CAMPTOSAR) IV 65 mg/m2 - - - - - -  octreotide (SANDOSTATIN LAR) IM - - - 20 mg - - -  octreotide (SANDOSTATIN) [place] - - 200 mcg - - - -  ondansetron (ZOFRAN) IV [ 16 mg ] 4 mg - - - [ 8 mg ] -  palonosetron (ALOXI) IV - - - -  0.25 mg - 0.25 mg  pegfilgrastim (NEULASTA ONPRO KIT) [place] - - - - - - -    INTERVAL HISTORY: 81 year old lady with 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   He should not is here for ongoing evaluation and continuation of treatment.  No chills.  No fever.  Appetite has been stable.  Right upper quadrant abdominal pain has improved and well controlled with oxycodone.  Here for further follow-up and continuation of gemcitabine therapy  REVIEW OF SYSTEMS:    general status: Patient is feeling weak and tired.  No change in a performance status.  No chills.  No fever. HEENT: Alopecia.  No evidence of stomatitis Chest membranes are dry Lungs: No cough or shortness of breath Had thoracentesis and fluid was positive for small cell undifferentiated carcinoma of lung Cardiac: No chest pain or paroxysmal nocturnal dyspnea GI: Diarrhea has improved Skin: Poor skin turgor Lower extremity no swelling Neurological system: No tingling.  No numbness.  No other focal signs Musculoskeletal system no bony pains  Genitourinary system: No dysuria.  No hematuria.  No frequency.  No flank pain. Neurological system: No dizziness.  No tingling.  His was.  no tingling numbness.  no focal weakness or any focal signs. Depression has improved ve.  PAST MEDICAL HISTORY: Past Medical History  Diagnosis Date  . CHF (congestive heart failure) ([place])   . Diabetes mellitus without complication ([place])   . Hypertension   . Presence of permanent cardiac pacemaker   .  Dysrhythmia     afib  . Depression   . GERD (gastroesophageal reflux disease)   . Coronary artery disease   . Cancer ([place])     breast,uterine  . COPD (chronic obstructive pulmonary disease) ([place])   . Arthritis   . Anemia   . Anginal pain ([place])   . Cancer of upper lobe of right lung ([place]) 11/03/2014  . Breast cancer [facility])       Surgical History            Diabetes controlled with oral medication                               Previous history of congestive heart                               failure                               Coronary artery disease with stent                               placement and bypass surgery Hypertension                               Osteoarthritis                               Colon polyps                               Chronic low back pain  Past Surgical History:                               Coronary bypass surgery and stent                               placement                                Pacemaker because of atrial fibrillation                               Appendectomy                               Hemorrhoidectomy                                                              Family History:                               Family history of breast cancer and colon                               cancer. History of anemia, diabetes, heart                               disease, and hypertension in the family.                                                              Social History:                               Does not smoke now. Does not drink. Used                               to smoke in the pa ADVANCED DIRECTIVES:  No flowsheet data found.  HEALTH MAINTENANCE: Social History  Substance Use Topics  . Smoking status: Former Smoker    Quit date: 10/21/1997  . Smokeless tobacco: Never Used  . Alcohol Use: No      Allergies  Allergen Reactions  . Amoxicillin-Pot  Clavulanate Hives and Diarrhea  . Fentanyl Itching  . Codeine Nausea And Vomiting and Other (See Comments)    Other Reaction: "very ill"  . Latex Rash  . Other Rash    Current Outpatient Prescriptions  Medication Sig Dispense Refill  . albuterol (PROAIR HFA) 108 (90 BASE) MCG/ACT inhaler Inhale into the lungs.    . ALPRAZolam (XANAX) 0.5 MG tablet Take 0.5 mg by mouth once  a week.     Marland Kitchen aspirin 81 MG tablet Take 81 mg by mouth daily.     Marland Kitchen bismuth subsalicylate (KAOPECTATE) 262 MG/15ML suspension Take 30 mLs by mouth 4 (four) times daily. 360 mL 0  . cyclobenzaprine (FLEXERIL) 5 MG tablet Take 5 mg by mouth daily as needed for muscle spasms.     . dabigatran (PRADAXA) 150 MG CAPS capsule TAKE ONE CAPSULE BY MOUTH TWICE DAILY    . dicyclomine (BENTYL) 10 MG capsule Take 10 mg by mouth 3 (three) times daily as needed (abdominal pain).     . fentaNYL (DURAGESIC - DOSED MCG/HR) 12 MCG/HR Place 1 patch (12.5 mcg total) onto the skin every 3 (three) days. 10 patch 0  . furosemide (LASIX) 40 MG tablet Take 40 mg by mouth 2 (two) times daily.     Marland Kitchen gabapentin (NEURONTIN) 100 MG capsule Take 100 mg by mouth every morning.     Marland Kitchen glipiZIDE (GLUCOTROL) 10 MG tablet Take 10 mg by mouth 2 (two) times daily before a meal.   0  . insulin lispro (HUMALOG) 100 UNIT/ML KiwkPen as per sliding scale< than 150-- No insulin150-200---4 units 201-250---6 units251-300--- 8 units301-350----10 units.351-400---12 units    . isosorbide mononitrate (IMDUR) 60 MG 24 hr tablet Take 60 mg by mouth daily.     Marland Kitchen levofloxacin (LEVAQUIN) 500 MG tablet Take 1 tablet (500 mg total) by mouth daily. 7 tablet 0  . levothyroxine (SYNTHROID, LEVOTHROID) 25 MCG tablet Take 25 mcg by mouth daily before breakfast.     . lidocaine-prilocaine (EMLA) cream Apply 1 application topically as needed. 30 g 3  . Liraglutide 18 MG/3ML SOPN Inject 3 mLs into the skin daily.     Marland Kitchen losartan (COZAAR) 100 MG tablet Take 100 mg by mouth daily.     .  magic mouthwash w/lidocaine SOLN Take 5 mLs by mouth 4 (four) times daily as needed for mouth pain. 480 mL 3  . Magnesium-Calcium-Folic Acid 993-716-9 MG TABS Take 1 tablet by mouth daily.     . metFORMIN (GLUCOPHAGE) 1000 MG tablet Take 1,000 mg by mouth 2 (two) times daily.     . metoprolol-hydrochlorothiazide (LOPRESSOR HCT) 50-25 MG per tablet Take 1 tablet by mouth daily.     . metroNIDAZOLE (FLAGYL) 500 MG tablet Take 1 tablet (500 mg total) by mouth 3 (three) times daily. 21 tablet 0  . nitroGLYCERIN (NITROSTAT) 0.4 MG SL tablet Place 0.4 mg under the tongue every 5 (five) minutes as needed for chest pain.     Marland Kitchen ondansetron (ZOFRAN) 8 MG tablet Take 1 tablet (8 mg total) by mouth 2 (two) times daily. Start the day after chemo for 3 days. Then take as needed for nausea or vomiting. 30 tablet 2  . oxyCODONE (OXY IR/ROXICODONE) 5 MG immediate release tablet Take 1 tablet (5 mg total) by mouth every 4 (four) hours as needed for severe pain. 30 tablet 0  . potassium chloride (K-DUR) 10 MEQ tablet Take 2 tablets (20 mEq total) by mouth daily. 60 tablet 0  . promethazine (PHENERGAN) 25 MG tablet TAKE ONE TABLET BY MOUTH EVERY SIX HOURSAS NEEDED FOR NAUSEA OR VOMITING 30 tablet 0  . RABEprazole (ACIPHEX) 20 MG tablet Take 20 mg by mouth daily.     . simvastatin (ZOCOR) 20 MG tablet Take 20 mg by mouth every evening.      No current facility-administered medications for this visit.   Facility-Administered Medications Ordered in Other Visits  Medication  Dose Route Frequency Provider Last Rate Last Dose  . heparin lock flush 100 unit/mL  500 Units Intravenous Once Forest Gleason, MD   500 Units at 11/20/14 1116  . sodium chloride 0.9 % injection 10 mL  10 mL Intravenous PRN Forest Gleason, MD   10 mL at 11/20/14 0916  . sodium chloride 0.9 % injection 10 mL  10 mL Intravenous PRN Forest Gleason, MD   10 mL at 01/21/15 0915    OBJECTIVE:  Filed Vitals:   04/17/15 0945  BP: 163/93  Pulse: 90  Temp:  97.5 F (36.4 C)  Resp: 18     Body mass index is 27.7 kg/(m^2).    ECOG FS:1 - Symptomatic but completely ambulatory  PHYSICAL EXAM: General  status: Performance status is good.  Patient said clinical signs of dehydration  Skin turgor is poor.  Mucous membranes are dry. HEENT: No evidence of stomatitis. Sclera and conjunctivae :: No jaundice.   pale looking. Lungs: Air  entry equal on both sides.  No rhonchi.  No rales.  Cardiac: Heart sounds are normal.  No pericardial rub.  No murmur. There is some swelling above port.  Soft.  No fluid.  No redness or tenderness. Lymphatic system: Cervical, axillary, inguinal, lymph nodes not palpable GI: Abdomen is soft.  No ascites.  Liver liver is palpable.  Tenderness in left upper quadrant   No tenderness.  Bowel sounds are within normal limit Lower extremity: No edema Neurological system: Higher functions, cranial nerves intact no evidence of peripheral neuropathy.  Examination of both breasts.  Patient had down previous history of carcinoma of left breast no palpable masses axillary lymph nodes are normal.  Right breast free of masses.   All lab data has been reviewed  MEDICAL DECISION MAKING:    Because of myelosuppression and chemotherapy was held last weeks Will proceed with only gemcitabine today and starting from next cycle of Taxotere and gemcitabine on day 1 and day 15 would be given depending on patient's home blood count.  Restaging with CT scan of the chest in 3 weeks after next cycle of chemotherapy to decide whether to continue chemotherapy or not.       Forest Gleason, MD   04/17/2015 10:29 AM

## 2015-04-17 NOTE — Progress Notes (Signed)
Patient states she continues to have diarrhea, medication not helping.  Also c/o dry cough. BP 163/93.  Patient states she took her BP medication this morning.

## 2015-04-18 ENCOUNTER — Encounter: Payer: Self-pay | Admitting: Oncology

## 2015-05-01 ENCOUNTER — Inpatient Hospital Stay: Payer: Medicare Other

## 2015-05-01 ENCOUNTER — Inpatient Hospital Stay (HOSPITAL_BASED_OUTPATIENT_CLINIC_OR_DEPARTMENT_OTHER): Payer: Medicare Other | Admitting: Oncology

## 2015-05-01 ENCOUNTER — Other Ambulatory Visit: Payer: Self-pay | Admitting: *Deleted

## 2015-05-01 ENCOUNTER — Encounter: Payer: Self-pay | Admitting: Oncology

## 2015-05-01 VITALS — BP 164/92 | HR 92 | Temp 97.2°F | Resp 18 | Wt 157.6 lb

## 2015-05-01 DIAGNOSIS — C3490 Malignant neoplasm of unspecified part of unspecified bronchus or lung: Secondary | ICD-10-CM

## 2015-05-01 DIAGNOSIS — Z853 Personal history of malignant neoplasm of breast: Secondary | ICD-10-CM

## 2015-05-01 DIAGNOSIS — Z87891 Personal history of nicotine dependence: Secondary | ICD-10-CM

## 2015-05-01 DIAGNOSIS — Z8542 Personal history of malignant neoplasm of other parts of uterus: Secondary | ICD-10-CM

## 2015-05-01 DIAGNOSIS — R197 Diarrhea, unspecified: Secondary | ICD-10-CM

## 2015-05-01 DIAGNOSIS — E119 Type 2 diabetes mellitus without complications: Secondary | ICD-10-CM

## 2015-05-01 DIAGNOSIS — I10 Essential (primary) hypertension: Secondary | ICD-10-CM

## 2015-05-01 DIAGNOSIS — M545 Low back pain: Secondary | ICD-10-CM

## 2015-05-01 DIAGNOSIS — M1990 Unspecified osteoarthritis, unspecified site: Secondary | ICD-10-CM

## 2015-05-01 DIAGNOSIS — R5383 Other fatigue: Secondary | ICD-10-CM

## 2015-05-01 DIAGNOSIS — G8929 Other chronic pain: Secondary | ICD-10-CM

## 2015-05-01 DIAGNOSIS — Z950 Presence of cardiac pacemaker: Secondary | ICD-10-CM

## 2015-05-01 DIAGNOSIS — C787 Secondary malignant neoplasm of liver and intrahepatic bile duct: Secondary | ICD-10-CM | POA: Diagnosis not present

## 2015-05-01 DIAGNOSIS — Z794 Long term (current) use of insulin: Secondary | ICD-10-CM

## 2015-05-01 DIAGNOSIS — C3411 Malignant neoplasm of upper lobe, right bronchus or lung: Secondary | ICD-10-CM

## 2015-05-01 DIAGNOSIS — Z7984 Long term (current) use of oral hypoglycemic drugs: Secondary | ICD-10-CM

## 2015-05-01 DIAGNOSIS — I4891 Unspecified atrial fibrillation: Secondary | ICD-10-CM

## 2015-05-01 DIAGNOSIS — Z86010 Personal history of colonic polyps: Secondary | ICD-10-CM

## 2015-05-01 DIAGNOSIS — Z8679 Personal history of other diseases of the circulatory system: Secondary | ICD-10-CM

## 2015-05-01 DIAGNOSIS — M129 Arthropathy, unspecified: Secondary | ICD-10-CM

## 2015-05-01 DIAGNOSIS — J449 Chronic obstructive pulmonary disease, unspecified: Secondary | ICD-10-CM

## 2015-05-01 DIAGNOSIS — Z7982 Long term (current) use of aspirin: Secondary | ICD-10-CM | POA: Diagnosis not present

## 2015-05-01 DIAGNOSIS — Z800 Family history of malignant neoplasm of digestive organs: Secondary | ICD-10-CM

## 2015-05-01 DIAGNOSIS — R531 Weakness: Secondary | ICD-10-CM

## 2015-05-01 DIAGNOSIS — F329 Major depressive disorder, single episode, unspecified: Secondary | ICD-10-CM

## 2015-05-01 DIAGNOSIS — Z862 Personal history of diseases of the blood and blood-forming organs and certain disorders involving the immune mechanism: Secondary | ICD-10-CM

## 2015-05-01 DIAGNOSIS — Z79899 Other long term (current) drug therapy: Secondary | ICD-10-CM

## 2015-05-01 DIAGNOSIS — Z803 Family history of malignant neoplasm of breast: Secondary | ICD-10-CM

## 2015-05-01 DIAGNOSIS — I2510 Atherosclerotic heart disease of native coronary artery without angina pectoris: Secondary | ICD-10-CM

## 2015-05-01 DIAGNOSIS — K219 Gastro-esophageal reflux disease without esophagitis: Secondary | ICD-10-CM

## 2015-05-01 DIAGNOSIS — R0602 Shortness of breath: Secondary | ICD-10-CM

## 2015-05-01 LAB — CBC WITH DIFFERENTIAL/PLATELET
BASOS ABS: 0.1 10*3/uL (ref 0–0.1)
BASOS PCT: 1 %
EOS ABS: 0 10*3/uL (ref 0–0.7)
Eosinophils Relative: 1 %
HEMATOCRIT: 32.8 % — AB (ref 35.0–47.0)
HEMOGLOBIN: 10.5 g/dL — AB (ref 12.0–16.0)
Lymphocytes Relative: 10 %
Lymphs Abs: 0.9 10*3/uL — ABNORMAL LOW (ref 1.0–3.6)
MCH: 29.1 pg (ref 26.0–34.0)
MCHC: 31.9 g/dL — AB (ref 32.0–36.0)
MCV: 91.4 fL (ref 80.0–100.0)
MONOS PCT: 7 %
Monocytes Absolute: 0.6 10*3/uL (ref 0.2–0.9)
NEUTROS ABS: 7.3 10*3/uL — AB (ref 1.4–6.5)
NEUTROS PCT: 81 %
Platelets: 219 10*3/uL (ref 150–440)
RBC: 3.59 MIL/uL — AB (ref 3.80–5.20)
RDW: 18.8 % — ABNORMAL HIGH (ref 11.5–14.5)
WBC: 8.9 10*3/uL (ref 3.6–11.0)

## 2015-05-01 LAB — COMPREHENSIVE METABOLIC PANEL
ALBUMIN: 3.5 g/dL (ref 3.5–5.0)
ALK PHOS: 293 U/L — AB (ref 38–126)
ALT: 15 U/L (ref 14–54)
AST: 28 U/L (ref 15–41)
Anion gap: 10 (ref 5–15)
BILIRUBIN TOTAL: 0.8 mg/dL (ref 0.3–1.2)
BUN: 18 mg/dL (ref 6–20)
CALCIUM: 8.5 mg/dL — AB (ref 8.9–10.3)
CO2: 26 mmol/L (ref 22–32)
CREATININE: 0.68 mg/dL (ref 0.44–1.00)
Chloride: 99 mmol/L — ABNORMAL LOW (ref 101–111)
GFR calc Af Amer: >60 mL/min (ref >60)
GFR calc non Af Amer: >60 mL/min (ref >60)
GLUCOSE: 178 mg/dL — AB (ref 65–99)
Potassium: 3.3 mmol/L — ABNORMAL LOW (ref 3.5–5.1)
SODIUM: 135 mmol/L (ref 135–145)
TOTAL PROTEIN: 6.5 g/dL (ref 6.5–8.1)

## 2015-05-01 MED ORDER — FENTANYL 12 MCG/HR TD PT72: 12.5000 ug | MEDICATED_PATCH | TRANSDERMAL | Status: DC

## 2015-05-01 MED ORDER — HEPARIN SOD (PORK) LOCK FLUSH 100 UNIT/ML IV SOLN
INTRAVENOUS | Status: AC
  Filled 2015-05-01: qty 5

## 2015-05-01 MED ORDER — DIPHENOXYLATE-ATROPINE 2.5-0.025 MG PO TABS
1.0000 | ORAL_TABLET | Freq: Four times a day (QID) | ORAL | Status: AC | PRN
Start: 2015-05-01 — End: ?

## 2015-05-01 MED ORDER — HEPARIN SOD (PORK) LOCK FLUSH 100 UNIT/ML IV SOLN
500.0000 [IU] | Freq: Once | INTRAVENOUS | Status: AC
  Administered 2015-05-01: 500 [IU] via INTRAVENOUS

## 2015-05-01 MED ORDER — OXYCODONE HCL 5 MG PO TABS: 5.0000 mg | ORAL_TABLET | ORAL | Status: DC | PRN

## 2015-05-01 NOTE — Progress Notes (Signed)
[place] @ [facility] [phone]) [phone]  [phone]     Wendy Giles OB: [birth year]  MR#: [id number]  AJO#:[id number]  Patient Care Team: Tracie Harrier, MD as PCP - General (Internal Medicine) Isaias Cowman, MD as Consulting Physician (Cardiology)  CHIEF COMPLAINT:  Chief Complaint  Patient presents with  . Lung Cancer       Carcinoma of breast                                AJCC Staging:  pT1N0M_                               Stage Grouping: I                               Cancer Status: Evidence of disease.                               Estrogen receptor positive and                               progesterone receptor positive. HER- 2                               Negative. 2.  Abnormal CT scan of the chest (June, 2016) Oncology History   1.  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  Previous history of carcinoma breast stage I disease 3.  Started on chemotherapy with carboplatinum and VP-16 from November 20, 2014 MRI scan of brain was negative (July, 2016)   Patient was started on carboplatinum and VP-16 which has been discontinued in September of 2016 (received total of 3 cycles of chemotherapy) CT scan did not see any significant response 4, patient will be started on carboplatinum and CPT-11 from January 28, 2015  5.  Patient had severe diarrhea so carboplatinum CPT-11 has been discontinued patient has been started on Taxotere and gemcitabine combination from November of 2016  Oncology Flowsheet 02/10/2015 02/12/2015 02/13/2015 03/06/2015 03/13/2015 04/03/2015 04/17/2015  Day, Cycle - - - Day 1, Cycle 1 Day 8, Cycle 1 Day 1, Cycle 2 Day 8, Cycle 2  CARBOplatin (PARAPLATIN) IV - - - - - - -  dexamethasone (DECADRON) IV - - - [ 12 mg ] [ 10 mg ] [ 12 mg ] [ 20 mg ]  DOCEtaxel  (TAXOTERE) IV - - - 40 mg/m2 - 60 mg/m2 -  etoposide (VEPESID) IV - - - - - - -  fosaprepitant (EMEND) IV - - - [ 150 mg ] - [ 150 mg ] -  gemcitabine (GEMZAR) IV - - - 1,800 mg 1,800 mg 1,800 mg 1,800 mg  irinotecan (CAMPTOSAR) IV - - - - - - -  octreotide (SANDOSTATIN LAR) IM - - 20 mg - - - -  octreotide (SANDOSTATIN) [place] - 200 mcg - - - - -  ondansetron (ZOFRAN) IV 4 mg - - - [ 8 mg ] - [ 8 mg ]  palonosetron (ALOXI) IV - - -  0.25 mg - 0.25 mg -  pegfilgrastim (NEULASTA ONPRO KIT) [place] - - - - - - -    INTERVAL HISTORY: 81 year old lady with small cell undifferentiated carcinoma of lung metastases to the liver stage IV disease came today for ongoing evaluation and treatment However patient started having back pain and pain radiating in the right upper quadrant of abdomen.  Constant dull aching.  Patient and out of pain medication.  Also has diarrhea Imodium did not help but Lomotil was helping.  Patient is feeling somewhat weak and tired.  No chills.  No fever. She  feels like having flu.  REVIEW OF SYSTEMS:    general status: Patient is feeling weak and tired.  No change in a performance status.  No chills.  No fever. HEENT: Alopecia.  No evidence of stomatitis Chest: Dry hacking cough no chills or fever Lungs:  shortness of breath and exertion Had thoracentesis and fluid was positive for small cell undifferentiated carcinoma of lung Cardiac: No chest pain or paroxysmal nocturnal dyspnea GI: Diarrhea has improved Skin: Poor skin turgor Lower extremity welling 1+ Neurological system: No tingling.  No numbness.  No other focal signs Musculoskeletal system no bony pains  Genitourinary system: No dysuria.  No hematuria.  No frequency.  No flank pain. Neurological system: No dizziness.  No tingling.  His was.  no tingling numbness.  no focal weakness or any focal signs. Depression has improved ve.  PAST MEDICAL HISTORY: Past Medical History  Diagnosis Date  . CHF (congestive heart  failure) ([place])   . Diabetes mellitus without complication ([place])   . Hypertension   . Presence of permanent cardiac pacemaker   . Dysrhythmia     afib  . Depression   . GERD (gastroesophageal reflux disease)   . Coronary artery disease   . Cancer ([place])     breast,uterine  . COPD (chronic obstructive pulmonary disease) ([place])   . Arthritis   . Anemia   . Anginal pain ([place])   . Cancer of upper lobe of right lung ([place]) 11/03/2014  . Breast cancer [facility])       Surgical History            Diabetes controlled with oral medication                               Previous history of congestive heart                               failure                               Coronary artery disease with stent                               placement and bypass surgery Hypertension                               Osteoarthritis                               Colon polyps  Chronic low back pain                                                              Past Surgical History:                               Coronary bypass surgery and stent                               placement                                Pacemaker because of atrial fibrillation                               Appendectomy                               Hemorrhoidectomy                                                              Family History:                               Family history of breast cancer and colon                               cancer. History of anemia, diabetes, heart                               disease, and hypertension in the family.                                                              Social History:                               Does not smoke now. Does not drink. Used                               to smoke in the pa ADVANCED DIRECTIVES:  No flowsheet data found.  HEALTH MAINTENANCE: Social History  Substance Use Topics  . Smoking status: Former Smoker    Quit date:  10/21/1997  . Smokeless tobacco: Never Used  . Alcohol Use: No      Allergies  Allergen Reactions  . Amoxicillin-Pot Clavulanate Hives and Diarrhea  . Fentanyl Itching  .  Codeine Nausea And Vomiting and Other (See Comments)    Other Reaction: "very ill"  . Latex Rash  . Other Rash    Current Outpatient Prescriptions  Medication Sig Dispense Refill  . albuterol (PROAIR HFA) 108 (90 BASE) MCG/ACT inhaler Inhale into the lungs.    . ALPRAZolam (XANAX) 0.5 MG tablet Take 0.5 mg by mouth once a week.     Marland Kitchen aspirin 81 MG tablet Take 81 mg by mouth daily.     Marland Kitchen bismuth subsalicylate (KAOPECTATE) 262 MG/15ML suspension Take 30 mLs by mouth 4 (four) times daily. 360 mL 0  . cyclobenzaprine (FLEXERIL) 5 MG tablet Take 5 mg by mouth daily as needed for muscle spasms.     . dabigatran (PRADAXA) 150 MG CAPS capsule TAKE ONE CAPSULE BY MOUTH TWICE DAILY    . dicyclomine (BENTYL) 10 MG capsule Take 10 mg by mouth 3 (three) times daily as needed (abdominal pain).     . fentaNYL (DURAGESIC - DOSED MCG/HR) 12 MCG/HR Place 1 patch (12.5 mcg total) onto the skin every 3 (three) days. 10 patch 0  . furosemide (LASIX) 40 MG tablet Take 40 mg by mouth 2 (two) times daily.     Marland Kitchen gabapentin (NEURONTIN) 100 MG capsule Take 100 mg by mouth every morning.     Marland Kitchen glipiZIDE (GLUCOTROL) 10 MG tablet Take 10 mg by mouth 2 (two) times daily before a meal.   0  . insulin lispro (HUMALOG) 100 UNIT/ML KiwkPen as per sliding scale< than 150-- No insulin150-200---4 units 201-250---6 units251-300--- 8 units301-350----10 units.351-400---12 units    . isosorbide mononitrate (IMDUR) 60 MG 24 hr tablet Take 60 mg by mouth daily.     Marland Kitchen levofloxacin (LEVAQUIN) 500 MG tablet Take 1 tablet (500 mg total) by mouth daily. 7 tablet 0  . levothyroxine (SYNTHROID, LEVOTHROID) 25 MCG tablet Take 25 mcg by mouth daily before breakfast.     . lidocaine-prilocaine (EMLA) cream Apply 1 application topically as needed. 30 g 3  . Liraglutide  18 MG/3ML SOPN Inject 3 mLs into the skin daily.     Marland Kitchen losartan (COZAAR) 100 MG tablet Take 100 mg by mouth daily.     . magic mouthwash w/lidocaine SOLN Take 5 mLs by mouth 4 (four) times daily as needed for mouth pain. 480 mL 3  . Magnesium-Calcium-Folic Acid 694-854-6 MG TABS Take 1 tablet by mouth daily.     . metFORMIN (GLUCOPHAGE) 1000 MG tablet Take 1,000 mg by mouth 2 (two) times daily.     . metoprolol-hydrochlorothiazide (LOPRESSOR HCT) 50-25 MG per tablet Take 1 tablet by mouth daily.     . metroNIDAZOLE (FLAGYL) 500 MG tablet Take 1 tablet (500 mg total) by mouth 3 (three) times daily. 21 tablet 0  . nitroGLYCERIN (NITROSTAT) 0.4 MG SL tablet Place 0.4 mg under the tongue every 5 (five) minutes as needed for chest pain.     Marland Kitchen ondansetron (ZOFRAN) 8 MG tablet Take 1 tablet (8 mg total) by mouth 2 (two) times daily. Start the day after chemo for 3 days. Then take as needed for nausea or vomiting. 30 tablet 2  . oxyCODONE (OXY IR/ROXICODONE) 5 MG immediate release tablet Take 1 tablet (5 mg total) by mouth every 4 (four) hours as needed for severe pain. 60 tablet 0  . potassium chloride (K-DUR) 10 MEQ tablet Take 2 tablets (20 mEq total) by mouth daily. 60 tablet 0  . promethazine (PHENERGAN) 25 MG tablet TAKE ONE TABLET BY MOUTH  EVERY SIX HOURSAS NEEDED FOR NAUSEA OR VOMITING 30 tablet 0  . RABEprazole (ACIPHEX) 20 MG tablet Take 20 mg by mouth daily.     . simvastatin (ZOCOR) 20 MG tablet Take 20 mg by mouth every evening.     . diphenoxylate-atropine (LOMOTIL) 2.5-0.025 MG tablet Take 1 tablet by mouth 4 (four) times daily as needed for diarrhea or loose stools. 30 tablet 0   No current facility-administered medications for this visit.   Facility-Administered Medications Ordered in Other Visits  Medication Dose Route Frequency Provider Last Rate Last Dose  . heparin lock flush 100 unit/mL  500 Units Intravenous Once Forest Gleason, MD   500 Units at 11/20/14 1116  . sodium chloride 0.9  % injection 10 mL  10 mL Intravenous PRN Forest Gleason, MD   10 mL at 11/20/14 0916  . sodium chloride 0.9 % injection 10 mL  10 mL Intravenous PRN Forest Gleason, MD   10 mL at 01/21/15 0915    OBJECTIVE:  Filed Vitals:   05/01/15 0848  BP: 164/92  Pulse: 92  Temp: 97.2 F (36.2 C)  Resp: 18     Body mass index is 27.93 kg/(m^2).    ECOG FS:1 - Symptomatic but completely ambulatory  PHYSICAL EXAM: General  status: Performance status is good.  Patient said clinical signs of dehydration  Skin turgor is poor.  Mucous membranes are dry. HEENT: No evidence of stomatitis. Sclera and conjunctivae :: No jaundice.   pale looking. Lungs: Air  entry equal on both sides.  No rhonchi.  No rales.  Cardiac: Heart sounds are normal.  No pericardial rub.  No murmur. There is some swelling above port.  Soft.  No fluid.  No redness or tenderness. Lymphatic system: Cervical, axillary, inguinal, lymph nodes not palpable GI: Abdomen is soft.  No ascites.  Liver liver is palpable.  Tenderness in left upper quadrant   No tenderness.  Bowel sounds are within normal limit Lower extremity: No edema Neurological system: Higher functions, cranial nerves intact no evidence of peripheral neuropathy.  Examination of both breasts.  Patient had down previous history of carcinoma of left breast no palpable masses axillary lymph nodes are normal.  Right breast free of masses.   All lab data has been reviewed  MEDICAL DECISION MAKING:  Patient had number of complaints including diarrhea back pain which is new in onset. We would like to hold off further chemotherapy Continue Lomotil for diarrhea Patient was advised to drink a lot of fluid Fentanyl patch and oxycodone for pain control Proceed with CT scan next week to evaluate response to the chemotherapy The patient is not responding to the treatment possibility of hospice and palliative care can be considered. If patient is responding to the chemotherapy then does  adjustment to avoid diarrhea by splitting Taxotere dose to 30 mg on day 1 and day 15 along with gemcitabine can be considered.  Total duration of visit was 25 minutes.  50% or more time was spent in counseling patient and family regarding prognosis and options of treatment and available resources      Forest Gleason, MD   05/01/2015 9:06 AM

## 2015-05-01 NOTE — Progress Notes (Signed)
Patient is having a lot of pan in her right rib area.  She states she feels like she has broken her ribs.  Requesting refill for Oxycodone and Fentanyl patches and lomotil.

## 2015-05-07 ENCOUNTER — Ambulatory Visit
Admission: RE | Admit: 2015-05-07 | Discharge: 2015-05-07 | Disposition: A | Discharge status: Home / Self Care | Payer: Medicare Other | Source: Ambulatory Visit | Attending: Oncology | Admitting: Oncology

## 2015-05-07 DIAGNOSIS — C3411 Malignant neoplasm of upper lobe, right bronchus or lung: Secondary | ICD-10-CM | POA: Diagnosis present

## 2015-05-07 DIAGNOSIS — C7801 Secondary malignant neoplasm of right lung: Secondary | ICD-10-CM | POA: Diagnosis not present

## 2015-05-07 DIAGNOSIS — R590 Localized enlarged lymph nodes: Secondary | ICD-10-CM | POA: Diagnosis not present

## 2015-05-07 DIAGNOSIS — C7971 Secondary malignant neoplasm of right adrenal gland: Secondary | ICD-10-CM | POA: Insufficient documentation

## 2015-05-07 DIAGNOSIS — C7972 Secondary malignant neoplasm of left adrenal gland: Secondary | ICD-10-CM | POA: Diagnosis not present

## 2015-05-07 DIAGNOSIS — C787 Secondary malignant neoplasm of liver and intrahepatic bile duct: Secondary | ICD-10-CM | POA: Insufficient documentation

## 2015-05-07 DIAGNOSIS — C7802 Secondary malignant neoplasm of left lung: Secondary | ICD-10-CM | POA: Diagnosis not present

## 2015-05-07 DIAGNOSIS — K449 Diaphragmatic hernia without obstruction or gangrene: Secondary | ICD-10-CM | POA: Diagnosis not present

## 2015-05-07 MED ORDER — IOHEXOL 300 MG/ML  SOLN
100.0000 mL | Freq: Once | INTRAMUSCULAR | Status: AC | PRN
  Administered 2015-05-07: 100 mL via INTRAVENOUS

## 2015-05-14 ENCOUNTER — Telehealth: Payer: Self-pay | Admitting: *Deleted

## 2015-05-14 MED ORDER — OXYCODONE HCL 5 MG PO TABS: 5.0000 mg | ORAL_TABLET | ORAL | Status: DC | PRN

## 2015-05-14 NOTE — Telephone Encounter (Signed)
Informed that prescription is ready to pick up  

## 2015-05-15 ENCOUNTER — Ambulatory Visit: Payer: Medicare Other | Admitting: Oncology

## 2015-05-15 ENCOUNTER — Other Ambulatory Visit: Payer: Medicare Other

## 2015-05-16 ENCOUNTER — Inpatient Hospital Stay: Payer: Medicare Other

## 2015-05-16 ENCOUNTER — Inpatient Hospital Stay (HOSPITAL_BASED_OUTPATIENT_CLINIC_OR_DEPARTMENT_OTHER): Payer: Medicare Other | Admitting: Oncology

## 2015-05-16 ENCOUNTER — Ambulatory Visit: Payer: Medicare Other

## 2015-05-16 ENCOUNTER — Inpatient Hospital Stay: Payer: Medicare Other | Attending: Oncology

## 2015-05-16 DIAGNOSIS — Z79899 Other long term (current) drug therapy: Secondary | ICD-10-CM | POA: Insufficient documentation

## 2015-05-16 DIAGNOSIS — Z170 Estrogen receptor positive status [ER+]: Secondary | ICD-10-CM | POA: Diagnosis not present

## 2015-05-16 DIAGNOSIS — J90 Pleural effusion, not elsewhere classified: Secondary | ICD-10-CM | POA: Diagnosis not present

## 2015-05-16 DIAGNOSIS — Z7984 Long term (current) use of oral hypoglycemic drugs: Secondary | ICD-10-CM | POA: Diagnosis not present

## 2015-05-16 DIAGNOSIS — I509 Heart failure, unspecified: Secondary | ICD-10-CM | POA: Diagnosis not present

## 2015-05-16 DIAGNOSIS — C7802 Secondary malignant neoplasm of left lung: Secondary | ICD-10-CM | POA: Diagnosis not present

## 2015-05-16 DIAGNOSIS — G8929 Other chronic pain: Secondary | ICD-10-CM

## 2015-05-16 DIAGNOSIS — I10 Essential (primary) hypertension: Secondary | ICD-10-CM

## 2015-05-16 DIAGNOSIS — Z800 Family history of malignant neoplasm of digestive organs: Secondary | ICD-10-CM

## 2015-05-16 DIAGNOSIS — R590 Localized enlarged lymph nodes: Secondary | ICD-10-CM | POA: Insufficient documentation

## 2015-05-16 DIAGNOSIS — J449 Chronic obstructive pulmonary disease, unspecified: Secondary | ICD-10-CM | POA: Insufficient documentation

## 2015-05-16 DIAGNOSIS — E119 Type 2 diabetes mellitus without complications: Secondary | ICD-10-CM

## 2015-05-16 DIAGNOSIS — H9319 Tinnitus, unspecified ear: Secondary | ICD-10-CM | POA: Diagnosis not present

## 2015-05-16 DIAGNOSIS — R531 Weakness: Secondary | ICD-10-CM | POA: Insufficient documentation

## 2015-05-16 DIAGNOSIS — M545 Low back pain: Secondary | ICD-10-CM | POA: Insufficient documentation

## 2015-05-16 DIAGNOSIS — C7971 Secondary malignant neoplasm of right adrenal gland: Secondary | ICD-10-CM | POA: Diagnosis not present

## 2015-05-16 DIAGNOSIS — Z87891 Personal history of nicotine dependence: Secondary | ICD-10-CM | POA: Insufficient documentation

## 2015-05-16 DIAGNOSIS — M129 Arthropathy, unspecified: Secondary | ICD-10-CM

## 2015-05-16 DIAGNOSIS — C787 Secondary malignant neoplasm of liver and intrahepatic bile duct: Secondary | ICD-10-CM | POA: Insufficient documentation

## 2015-05-16 DIAGNOSIS — I4891 Unspecified atrial fibrillation: Secondary | ICD-10-CM

## 2015-05-16 DIAGNOSIS — I2510 Atherosclerotic heart disease of native coronary artery without angina pectoris: Secondary | ICD-10-CM

## 2015-05-16 DIAGNOSIS — D649 Anemia, unspecified: Secondary | ICD-10-CM | POA: Insufficient documentation

## 2015-05-16 DIAGNOSIS — R5383 Other fatigue: Secondary | ICD-10-CM | POA: Diagnosis not present

## 2015-05-16 DIAGNOSIS — Z803 Family history of malignant neoplasm of breast: Secondary | ICD-10-CM | POA: Insufficient documentation

## 2015-05-16 DIAGNOSIS — K219 Gastro-esophageal reflux disease without esophagitis: Secondary | ICD-10-CM | POA: Diagnosis not present

## 2015-05-16 DIAGNOSIS — Z9221 Personal history of antineoplastic chemotherapy: Secondary | ICD-10-CM | POA: Diagnosis not present

## 2015-05-16 DIAGNOSIS — C7972 Secondary malignant neoplasm of left adrenal gland: Secondary | ICD-10-CM | POA: Insufficient documentation

## 2015-05-16 DIAGNOSIS — F329 Major depressive disorder, single episode, unspecified: Secondary | ICD-10-CM

## 2015-05-16 DIAGNOSIS — R630 Anorexia: Secondary | ICD-10-CM | POA: Diagnosis not present

## 2015-05-16 DIAGNOSIS — R58 Hemorrhage, not elsewhere classified: Secondary | ICD-10-CM | POA: Insufficient documentation

## 2015-05-16 DIAGNOSIS — M1990 Unspecified osteoarthritis, unspecified site: Secondary | ICD-10-CM | POA: Insufficient documentation

## 2015-05-16 DIAGNOSIS — C3411 Malignant neoplasm of upper lobe, right bronchus or lung: Secondary | ICD-10-CM

## 2015-05-16 DIAGNOSIS — C50919 Malignant neoplasm of unspecified site of unspecified female breast: Secondary | ICD-10-CM | POA: Insufficient documentation

## 2015-05-16 DIAGNOSIS — Z7982 Long term (current) use of aspirin: Secondary | ICD-10-CM

## 2015-05-16 DIAGNOSIS — Z86010 Personal history of colonic polyps: Secondary | ICD-10-CM

## 2015-05-16 DIAGNOSIS — K449 Diaphragmatic hernia without obstruction or gangrene: Secondary | ICD-10-CM

## 2015-05-16 DIAGNOSIS — R1011 Right upper quadrant pain: Secondary | ICD-10-CM | POA: Insufficient documentation

## 2015-05-16 DIAGNOSIS — Z794 Long term (current) use of insulin: Secondary | ICD-10-CM

## 2015-05-16 LAB — COMPREHENSIVE METABOLIC PANEL
ALBUMIN: 3.4 g/dL — AB (ref 3.5–5.0)
ALK PHOS: 385 U/L — AB (ref 38–126)
ALT: 20 U/L (ref 14–54)
ANION GAP: 8 (ref 5–15)
AST: 36 U/L (ref 15–41)
BUN: 21 mg/dL — AB (ref 6–20)
CALCIUM: 8.3 mg/dL — AB (ref 8.9–10.3)
CO2: 25 mmol/L (ref 22–32)
Chloride: 98 mmol/L — ABNORMAL LOW (ref 101–111)
Creatinine, Ser: 0.92 mg/dL (ref 0.44–1.00)
GFR calc non Af Amer: 57 mL/min — ABNORMAL LOW (ref >60)
GLUCOSE: 353 mg/dL — AB (ref 65–99)
POTASSIUM: 3.2 mmol/L — AB (ref 3.5–5.1)
SODIUM: 131 mmol/L — AB (ref 135–145)
TOTAL PROTEIN: 6.7 g/dL (ref 6.5–8.1)
Total Bilirubin: 1.1 mg/dL (ref 0.3–1.2)

## 2015-05-16 LAB — CBC WITH DIFFERENTIAL/PLATELET
BASOS PCT: 1 %
Basophils Absolute: 0.1 10*3/uL (ref 0–0.1)
EOS ABS: 0 10*3/uL (ref 0–0.7)
EOS PCT: 0 %
HCT: 35.7 % (ref 35.0–47.0)
Hemoglobin: 11.5 g/dL — ABNORMAL LOW (ref 12.0–16.0)
LYMPHS ABS: 0.6 10*3/uL — AB (ref 1.0–3.6)
Lymphocytes Relative: 7 %
MCH: 29.1 pg (ref 26.0–34.0)
MCHC: 32.2 g/dL (ref 32.0–36.0)
MCV: 90.4 fL (ref 80.0–100.0)
MONO ABS: 0.6 10*3/uL (ref 0.2–0.9)
MONOS PCT: 7 %
Neutro Abs: 7.8 10*3/uL — ABNORMAL HIGH (ref 1.4–6.5)
Neutrophils Relative %: 85 %
Platelets: 215 10*3/uL (ref 150–440)
RBC: 3.95 MIL/uL (ref 3.80–5.20)
RDW: 19.4 % — AB (ref 11.5–14.5)
WBC: 9.1 10*3/uL (ref 3.6–11.0)

## 2015-05-16 LAB — MAGNESIUM: Magnesium: 1.6 mg/dL — ABNORMAL LOW (ref 1.7–2.4)

## 2015-05-16 MED ORDER — SENNOSIDES-DOCUSATE SODIUM 8.6-50 MG PO TABS: 1.0000 | ORAL_TABLET | Freq: Two times a day (BID) | ORAL | Status: AC

## 2015-05-16 MED ORDER — HEPARIN SOD (PORK) LOCK FLUSH 100 UNIT/ML IV SOLN
INTRAVENOUS | Status: AC
  Filled 2015-05-16: qty 5

## 2015-05-16 MED ORDER — HEPARIN SOD (PORK) LOCK FLUSH 100 UNIT/ML IV SOLN
500.0000 [IU] | Freq: Once | INTRAVENOUS | Status: AC
Start: 2015-05-16 — End: 2015-05-16
  Administered 2015-05-16: 500 [IU] via INTRAVENOUS

## 2015-05-16 MED ORDER — SODIUM CHLORIDE 0.9 % IJ SOLN
10.0000 mL | INTRAMUSCULAR | Status: DC | PRN
  Administered 2015-05-16: 10 mL via INTRAVENOUS
  Filled 2015-05-16: qty 10

## 2015-05-16 NOTE — Progress Notes (Signed)
Patient here today for CT scan results.

## 2015-05-18 ENCOUNTER — Encounter: Payer: Self-pay | Admitting: Oncology

## 2015-05-18 NOTE — Progress Notes (Signed)
[place] @ [facility] Telephone:(336) [phone]  Fax:(336) [phone]     Wendy Giles OB: 22-Sep-1932  MR#: [id number]  JOI#:[id number]  Patient Care Team: Tracie Harrier, MD as PCP - General (Internal Medicine) Isaias Cowman, MD as Consulting Physician (Cardiology)  CHIEF COMPLAINT:  Chief Complaint  Patient presents with  . Lung Cancer       Carcinoma of breast                                AJCC Staging:  pT1N0M_                               Stage Grouping: I                               Cancer Status: Evidence of disease.                               Estrogen receptor positive and                               progesterone receptor positive. HER- 2                               Negative. 2.  Abnormal CT scan of the chest (June, 2016) Oncology History   1.  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  Previous history of carcinoma breast stage I disease 3.  Started on chemotherapy with carboplatinum and VP-16 from November 20, 2014 MRI scan of brain was negative (July, 2016)   Patient was started on carboplatinum and VP-16 which has been discontinued in September of 2016 (received total of 3 cycles of chemotherapy) CT scan did not see any significant response 4, patient will be started on carboplatinum and CPT-11 from January 28, 2015  5.  Patient had severe diarrhea so carboplatinum CPT-11 has been discontinued patient has been started on Taxotere and gemcitabine combination from November of 2016 6.  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  Had a CT scan done patient is here for ongoing evaluation and treatment consideration Poor appetite.  REVIEW OF SYSTEMS:    general status: Patient is feeling weak and tired.  No change in a performance status.  No chills.  No fever. HEENT: Alopecia.  No evidence of stomatitis Chest: Dry hacking cough no chills or fever Lungs:  shortness of breath and exertion Had thoracentesis and fluid was positive for small cell undifferentiated carcinoma of lung Cardiac: No chest pain or paroxysmal nocturnal dyspnea GI: Diarrhea has improved Skin: Poor skin turgor Lower extremity welling 1+ Neurological system: No tingling.  No numbness.  No other focal signs Musculoskeletal system no bony pains  Genitourinary system: No  dysuria.  No hematuria.  No frequency.  No flank pain. Neurological system: No dizziness.  No tingling.  His was.  no tingling numbness.  no focal weakness or any focal signs. Depression has improved ve.  PAST MEDICAL HISTORY: Past Medical History  Diagnosis Date  . CHF (congestive heart failure) ([place])   . Diabetes mellitus without complication ([place])   . Hypertension   . Presence of permanent cardiac pacemaker   . Dysrhythmia     afib  . Depression   . GERD (gastroesophageal reflux disease)   . Coronary artery disease   . Cancer ([place])     breast,uterine  . COPD (chronic obstructive pulmonary disease) ([place])   . Arthritis   . Anemia   . Anginal pain ([place])   . Cancer of upper lobe of right lung ([place]) 11/03/2014  . Breast cancer [facility])       Surgical History            Diabetes controlled with oral medication                               Previous history of congestive heart                               failure                               Coronary artery disease with stent                               placement and bypass surgery Hypertension                               Osteoarthritis                                Colon polyps                               Chronic low back pain                                                              Past Surgical History:                               Coronary bypass surgery and stent                               placement                                Pacemaker because of atrial fibrillation  Appendectomy                               Hemorrhoidectomy                                                              Family History:                               Family history of breast cancer and colon                               cancer. History of anemia, diabetes, heart                               disease, and hypertension in the family.                                                              Social History:                               Does not smoke now. Does not drink. Used                               to smoke in the pa ADVANCED DIRECTIVES:  No flowsheet data found.  HEALTH MAINTENANCE: Social History  Substance Use Topics  . Smoking status: Former Smoker    Quit date: 10/21/1997  . Smokeless tobacco: Never Used  . Alcohol Use: No      Allergies  Allergen Reactions  . Amoxicillin-Pot Clavulanate Hives and Diarrhea  . Fentanyl Itching  . Codeine Nausea And Vomiting and Other (See Comments)    Other Reaction: "very ill"  . Latex Rash  . Other Rash    Current Outpatient Prescriptions  Medication Sig Dispense Refill  . albuterol (PROAIR HFA) 108 (90 BASE) MCG/ACT inhaler Inhale into the lungs.    . ALPRAZolam (XANAX) 0.5 MG tablet Take 0.5 mg by mouth once a week.     Marland Kitchen aspirin 81 MG tablet Take 81 mg by mouth daily.     Marland Kitchen bismuth subsalicylate (KAOPECTATE) 262 MG/15ML suspension Take 30 mLs by mouth 4 (four) times daily. 360 mL 0  . cyclobenzaprine (FLEXERIL) 5 MG tablet Take 5 mg by mouth daily as needed for muscle spasms.     . dabigatran (PRADAXA) 150 MG CAPS capsule TAKE ONE CAPSULE BY MOUTH  TWICE DAILY    . dicyclomine (BENTYL) 10 MG capsule Take 10 mg by mouth 3 (three) times daily as needed (abdominal pain).     Marland Kitchen diphenoxylate-atropine (LOMOTIL) 2.5-0.025 MG tablet Take 1 tablet by mouth 4 (four) times daily as needed for diarrhea or loose stools. 30 tablet 0  . fentaNYL (DURAGESIC - DOSED MCG/HR) 12  MCG/HR Place 1 patch (12.5 mcg total) onto the skin every 3 (three) days. 10 patch 0  . furosemide (LASIX) 40 MG tablet Take 40 mg by mouth 2 (two) times daily.     Marland Kitchen gabapentin (NEURONTIN) 100 MG capsule Take 100 mg by mouth every morning.     Marland Kitchen glipiZIDE (GLUCOTROL) 10 MG tablet Take 10 mg by mouth 2 (two) times daily before a meal.   0  . insulin lispro (HUMALOG) 100 UNIT/ML KiwkPen as per sliding scale< than 150-- No insulin150-200---4 units 201-250---6 units251-300--- 8 units301-350----10 units.351-400---12 units    . isosorbide mononitrate (IMDUR) 60 MG 24 hr tablet Take 60 mg by mouth daily.     Marland Kitchen levofloxacin (LEVAQUIN) 500 MG tablet Take 1 tablet (500 mg total) by mouth daily. 7 tablet 0  . levothyroxine (SYNTHROID, LEVOTHROID) 25 MCG tablet Take 25 mcg by mouth daily before breakfast.     . lidocaine-prilocaine (EMLA) cream Apply 1 application topically as needed. 30 g 3  . Liraglutide 18 MG/3ML SOPN Inject 3 mLs into the skin daily.     Marland Kitchen losartan (COZAAR) 100 MG tablet Take 100 mg by mouth daily.     . magic mouthwash w/lidocaine SOLN Take 5 mLs by mouth 4 (four) times daily as needed for mouth pain. 480 mL 3  . Magnesium-Calcium-Folic Acid 992-426-8 MG TABS Take 1 tablet by mouth daily.     . metFORMIN (GLUCOPHAGE) 1000 MG tablet Take 1,000 mg by mouth 2 (two) times daily.     . metoprolol-hydrochlorothiazide (LOPRESSOR HCT) 50-25 MG per tablet Take 1 tablet by mouth daily.     . metroNIDAZOLE (FLAGYL) 500 MG tablet Take 1 tablet (500 mg total) by mouth 3 (three) times daily. 21 tablet 0  . nitroGLYCERIN (NITROSTAT) 0.4 MG SL tablet Place 0.4 mg under the tongue every 5  (five) minutes as needed for chest pain.     Marland Kitchen ondansetron (ZOFRAN) 8 MG tablet Take 1 tablet (8 mg total) by mouth 2 (two) times daily. Start the day after chemo for 3 days. Then take as needed for nausea or vomiting. 30 tablet 2  . oxyCODONE (OXY IR/ROXICODONE) 5 MG immediate release tablet Take 1 tablet (5 mg total) by mouth every 4 (four) hours as needed for severe pain. 60 tablet 0  . potassium chloride (K-DUR) 10 MEQ tablet Take 2 tablets (20 mEq total) by mouth daily. 60 tablet 0  . promethazine (PHENERGAN) 25 MG tablet TAKE ONE TABLET BY MOUTH EVERY SIX HOURSAS NEEDED FOR NAUSEA OR VOMITING 30 tablet 0  . RABEprazole (ACIPHEX) 20 MG tablet Take 20 mg by mouth daily.     . simvastatin (ZOCOR) 20 MG tablet Take 20 mg by mouth every evening.     . senna-docusate (SENNA S) 8.6-50 MG tablet Take 1 tablet by mouth 2 (two) times daily. 60 tablet 3   No current facility-administered medications for this visit.   Facility-Administered Medications Ordered in Other Visits  Medication Dose Route Frequency Provider Last Rate Last Dose  . heparin lock flush 100 unit/mL  500 Units Intravenous Once Forest Gleason, MD   500 Units at 11/20/14 1116  . sodium chloride 0.9 % injection 10 mL  10 mL Intravenous PRN Forest Gleason, MD   10 mL at 11/20/14 0916  . sodium chloride 0.9 % injection 10 mL  10 mL Intravenous PRN Forest Gleason, MD   10 mL at 01/21/15 0915    OBJECTIVE:  There were no vitals filed for  this visit.   There is no weight on file to calculate BMI.    ECOG FS:1 - Symptomatic but completely ambulatory  PHYSICAL EXAM: General  status: Performance status is good.  Patient said clinical signs of dehydration  Skin turgor is poor.  Mucous membranes are dry. HEENT: No evidence of stomatitis. Sclera and conjunctivae :: No jaundice.   pale looking. Lungs: Air  entry equal on both sides.  No rhonchi.  No rales.  Cardiac: Heart sounds are normal.  No pericardial rub.  No murmur. There is some swelling  above port.  Soft.  No fluid.  No redness or tenderness. Lymphatic system: Cervical, axillary, inguinal, lymph nodes not palpable GI: Abdomen is soft.  No ascites.  Liver liver is palpable.  Tenderness in left upper quadrant   No tenderness.  Bowel sounds are within normal limit Lower extremity: No edema Neurological system: Higher functions, cranial nerves intact no evidence of peripheral neuropathy.  Examination of both breasts.  Patient had down previous history of carcinoma of left breast no palpable masses axillary lymph nodes are normal.  Right breast free of masses. December 27th, 2016 IMPRESSION: 1. Interval growth of the primary right upper lobe neoplasm. 2. Interval growth of right hilar, right paratracheal mediastinal and upper retroperitoneal lymphadenopathy. 3. Increased size and number of bilateral pulmonary metastases. 4. Increased size and number of liver metastases. 5. New bilateral adrenal metastases. 6. Trace right pleural effusion is decreased. 7. Moderate hiatal hernia.  All lab data has been reviewed  MEDICAL DECISION MAKING:  Small cell undifferentiated carcinoma of lung stage IV.  CT scan has been reviewed independently and reviewed with the patient.  There is progressive disease.  Taxotere and gemcitabine will be discontinued.  We discussed possibility of hospice and palliative care with pain control management  Possibility of using anti-PDL  drug if it is approved on compassionate basis Total duration of visit was 25 minutes.  50% or more time was spent in counseling patient and family regarding prognosis and options of treatment and available resources      Forest Gleason, MD   05/18/2015 3:50 PM

## 2015-05-21 ENCOUNTER — Other Ambulatory Visit: Payer: Self-pay | Admitting: *Deleted

## 2015-05-21 ENCOUNTER — Telehealth: Payer: Self-pay | Admitting: *Deleted

## 2015-05-21 DIAGNOSIS — C3411 Malignant neoplasm of upper lobe, right bronchus or lung: Secondary | ICD-10-CM

## 2015-05-21 NOTE — Telephone Encounter (Signed)
Patient called regarding new treatment and it being approved by insurance.  Patient would like [place] to call her back with the status.

## 2015-05-21 NOTE — Telephone Encounter (Signed)
Pt will be schedule to get new treatment on Monday 1/16. Scheduling will call pt with appt details.

## 2015-05-22 ENCOUNTER — Telehealth: Payer: Self-pay | Admitting: *Deleted

## 2015-05-22 NOTE — Telephone Encounter (Signed)
Per Dr Oliva Bustard OK to have them cleaned, left message on VM for pt

## 2015-05-22 NOTE — Telephone Encounter (Signed)
Asking if it is all right to have her teeth cleaned

## 2015-05-23 ENCOUNTER — Telehealth: Payer: Self-pay | Admitting: *Deleted

## 2015-05-23 ENCOUNTER — Emergency Department
Admission: EM | Admit: 2015-05-23 | Discharge: 2015-05-23 | Disposition: A | Discharge status: Home / Self Care | Payer: Medicare Other | Attending: Emergency Medicine | Admitting: Emergency Medicine

## 2015-05-23 ENCOUNTER — Emergency Department: Payer: Medicare Other

## 2015-05-23 ENCOUNTER — Encounter: Payer: Self-pay | Admitting: Medical Oncology

## 2015-05-23 DIAGNOSIS — S60212A Contusion of left wrist, initial encounter: Secondary | ICD-10-CM | POA: Diagnosis not present

## 2015-05-23 DIAGNOSIS — W01198A Fall on same level from slipping, tripping and stumbling with subsequent striking against other object, initial encounter: Secondary | ICD-10-CM | POA: Diagnosis not present

## 2015-05-23 DIAGNOSIS — Z91040 Latex allergy status: Secondary | ICD-10-CM | POA: Diagnosis not present

## 2015-05-23 DIAGNOSIS — Z7982 Long term (current) use of aspirin: Secondary | ICD-10-CM | POA: Diagnosis not present

## 2015-05-23 DIAGNOSIS — Z87891 Personal history of nicotine dependence: Secondary | ICD-10-CM | POA: Diagnosis not present

## 2015-05-23 DIAGNOSIS — I10 Essential (primary) hypertension: Secondary | ICD-10-CM | POA: Insufficient documentation

## 2015-05-23 DIAGNOSIS — Z79891 Long term (current) use of opiate analgesic: Secondary | ICD-10-CM | POA: Insufficient documentation

## 2015-05-23 DIAGNOSIS — S0990XA Unspecified injury of head, initial encounter: Secondary | ICD-10-CM | POA: Diagnosis not present

## 2015-05-23 DIAGNOSIS — Y998 Other external cause status: Secondary | ICD-10-CM | POA: Insufficient documentation

## 2015-05-23 DIAGNOSIS — Z79899 Other long term (current) drug therapy: Secondary | ICD-10-CM | POA: Insufficient documentation

## 2015-05-23 DIAGNOSIS — Z880 Allergy status to penicillin: Secondary | ICD-10-CM | POA: Diagnosis not present

## 2015-05-23 DIAGNOSIS — Y9301 Activity, walking, marching and hiking: Secondary | ICD-10-CM | POA: Diagnosis not present

## 2015-05-23 DIAGNOSIS — Y9289 Other specified places as the place of occurrence of the external cause: Secondary | ICD-10-CM | POA: Insufficient documentation

## 2015-05-23 DIAGNOSIS — E119 Type 2 diabetes mellitus without complications: Secondary | ICD-10-CM | POA: Insufficient documentation

## 2015-05-23 DIAGNOSIS — S0083XA Contusion of other part of head, initial encounter: Secondary | ICD-10-CM

## 2015-05-23 DIAGNOSIS — Z7951 Long term (current) use of inhaled steroids: Secondary | ICD-10-CM | POA: Diagnosis not present

## 2015-05-23 DIAGNOSIS — W19XXXA Unspecified fall, initial encounter: Secondary | ICD-10-CM

## 2015-05-23 DIAGNOSIS — S01111A Laceration without foreign body of right eyelid and periocular area, initial encounter: Secondary | ICD-10-CM | POA: Insufficient documentation

## 2015-05-23 DIAGNOSIS — S0181XA Laceration without foreign body of other part of head, initial encounter: Secondary | ICD-10-CM

## 2015-05-23 MED ORDER — ONDANSETRON 4 MG PO TBDP: 4.0000 mg | ORAL_TABLET | Freq: Three times a day (TID) | ORAL | Status: DC | PRN

## 2015-05-23 MED ORDER — OXYCODONE HCL 30 MG PO TABS: 30.0000 mg | ORAL_TABLET | Freq: Four times a day (QID) | ORAL | Status: DC | PRN

## 2015-05-23 MED ORDER — LIDOCAINE-EPINEPHRINE (PF) 1 %-1:200000 IJ SOLN
INTRAMUSCULAR | Status: AC
  Filled 2015-05-23: qty 30

## 2015-05-23 NOTE — Discharge Instructions (Signed)
Contusion A contusion is a deep bruise. Contusions are the result of a blunt injury to tissues and muscle fibers under the skin. The injury causes bleeding under the skin. The skin overlying the contusion may turn blue, purple, or yellow. Minor injuries will give you a painless contusion, but more severe contusions may stay painful and swollen for a few weeks.  CAUSES  This condition is usually caused by a blow, trauma, or direct force to an area of the body. SYMPTOMS  Symptoms of this condition include:  Swelling of the injured area.  Pain and tenderness in the injured area.  Discoloration. The area may have redness and then turn blue, purple, or yellow. DIAGNOSIS  This condition is diagnosed based on a physical exam and medical history. An X-ray, CT scan, or MRI may be needed to determine if there are any associated injuries, such as broken bones (fractures). TREATMENT  Specific treatment for this condition depends on what area of the body was injured. In general, the best treatment for a contusion is resting, icing, applying pressure to (compression), and elevating the injured area. This is often called the RICE strategy. Over-the-counter anti-inflammatory medicines may also be recommended for pain control.  HOME CARE INSTRUCTIONS   Rest the injured area.  If directed, apply ice to the injured area:  Put ice in a plastic bag.  Place a towel between your skin and the bag.  Leave the ice on for 20 minutes, 2-3 times per day.  If directed, apply light compression to the injured area using an elastic bandage. Make sure the bandage is not wrapped too tightly. Remove and reapply the bandage as directed by your health care provider.  If possible, raise (elevate) the injured area above the level of your heart while you are sitting or lying down.  Take over-the-counter and prescription medicines only as told by your health care provider. SEEK MEDICAL CARE IF:  Your symptoms do not  improve after several days of treatment.  Your symptoms get worse.  You have difficulty moving the injured area. SEEK IMMEDIATE MEDICAL CARE IF:   You have severe pain.  You have numbness in a hand or foot.  Your hand or foot turns pale or cold.   This information is not intended to replace advice given to you by your health care provider. Make sure you discuss any questions you have with your health care provider.   Document Released: 02/04/2005 Document Revised: 01/16/2015 Document Reviewed: 09/12/2014 Elsevier Interactive Patient Education 2016 [employer]  Facial Laceration  A facial laceration is a cut on the face. These injuries can be painful and cause bleeding. Lacerations usually heal quickly, but they need special care to reduce scarring. DIAGNOSIS  Your health care provider will take a medical history, ask for details about how the injury occurred, and examine the wound to determine how deep the cut is. TREATMENT  Some facial lacerations may not require closure. Others may not be able to be closed because of an increased risk of infection. The risk of infection and the chance for successful closure will depend on various factors, including the amount of time since the injury occurred. The wound may be cleaned to help prevent infection. If closure is appropriate, pain medicines may be given if needed. Your health care provider will use stitches (sutures), wound glue (adhesive), or skin adhesive strips to repair the laceration. These tools bring the skin edges together to allow for faster healing and a better cosmetic outcome. If  needed, you may also be given a tetanus shot. HOME CARE INSTRUCTIONS  Only take over-the-counter or prescription medicines as directed by your health care provider.  Follow your health care provider's instructions for wound care. These instructions will vary depending on the technique used for closing the wound. For Sutures:  Keep the wound clean  and dry.   If you were given a bandage (dressing), you should change it at least once a day. Also change the dressing if it becomes wet or dirty, or as directed by your health care provider.   Wash the wound with soap and water 2 times a day. Rinse the wound off with water to remove all soap. Pat the wound dry with a clean towel.   After cleaning, apply a thin layer of the antibiotic ointment recommended by your health care provider. This will help prevent infection and keep the dressing from sticking.   You may shower as usual after the first 24 hours. Do not soak the wound in water until the sutures are removed.   Get your sutures removed as directed by your health care provider. With facial lacerations, sutures should usually be taken out after 4-5 days to avoid stitch marks.   Wait a few days after your sutures are removed before applying any makeup. For Skin Adhesive Strips:  Keep the wound clean and dry.   Do not get the skin adhesive strips wet. You may bathe carefully, using caution to keep the wound dry.   If the wound gets wet, pat it dry with a clean towel.   Skin adhesive strips will fall off on their own. You may trim the strips as the wound heals. Do not remove skin adhesive strips that are still stuck to the wound. They will fall off in time.  For Wound Adhesive:  You may briefly wet your wound in the shower or bath. Do not soak or scrub the wound. Do not swim. Avoid periods of heavy sweating until the skin adhesive has fallen off on its own. After showering or bathing, gently pat the wound dry with a clean towel.   Do not apply liquid medicine, cream medicine, ointment medicine, or makeup to your wound while the skin adhesive is in place. This may loosen the film before your wound is healed.   If a dressing is placed over the wound, be careful not to apply tape directly over the skin adhesive. This may cause the adhesive to be pulled off before the wound is  healed.   Avoid prolonged exposure to sunlight or tanning lamps while the skin adhesive is in place.  The skin adhesive will usually remain in place for 5-10 days, then naturally fall off the skin. Do not pick at the adhesive film.  After Healing: Once the wound has healed, cover the wound with sunscreen during the day for 1 full year. This can help minimize scarring. Exposure to ultraviolet light in the first year will darken the scar. It can take 1-2 years for the scar to lose its redness and to heal completely.  SEEK MEDICAL CARE IF:  You have a fever. SEEK IMMEDIATE MEDICAL CARE IF:  You have redness, pain, or swelling around the wound.   You see ayellowish-white fluid (pus) coming from the wound.    This information is not intended to replace advice given to you by your health care provider. Make sure you discuss any questions you have with your health care provider.   Document Released: 06/04/2004 Document  Revised: 05/18/2014 Document Reviewed: 12/08/2012 Elsevier Interactive Patient Education 2016 [employer].  Cryotherapy Cryotherapy means treatment with cold. Ice or gel packs can be used to reduce both pain and swelling. Ice is the most helpful within the first 24 to 48 hours after an injury or flare-up from overusing a muscle or joint. Sprains, strains, spasms, burning pain, shooting pain, and aches can all be eased with ice. Ice can also be used when recovering from surgery. Ice is effective, has very few side effects, and is safe for most people to use. PRECAUTIONS  Ice is not a safe treatment option for people with:  Raynaud phenomenon. This is a condition affecting small blood vessels in the extremities. Exposure to cold may cause your problems to return.  Cold hypersensitivity. There are many forms of cold hypersensitivity, including:  Cold urticaria. Red, itchy hives appear on the skin when the tissues begin to warm after being iced.  Cold erythema. This is a  red, itchy rash caused by exposure to cold.  Cold hemoglobinuria. Red blood cells break down when the tissues begin to warm after being iced. The hemoglobin that carry oxygen are passed into the urine because they cannot combine with blood proteins fast enough.  Numbness or altered sensitivity in the area being iced. If you have any of the following conditions, do not use ice until you have discussed cryotherapy with your caregiver:  Heart conditions, such as arrhythmia, angina, or chronic heart disease.  High blood pressure.  Healing wounds or open skin in the area being iced.  Current infections.  Rheumatoid arthritis.  Poor circulation.  Diabetes. Ice slows the blood flow in the region it is applied. This is beneficial when trying to stop inflamed tissues from spreading irritating chemicals to surrounding tissues. However, if you expose your skin to cold temperatures for too long or without the proper protection, you can damage your skin or nerves. Watch for signs of skin damage due to cold. HOME CARE INSTRUCTIONS Follow these tips to use ice and cold packs safely.  Place a dry or damp towel between the ice and skin. A damp towel will cool the skin more quickly, so you may need to shorten the time that the ice is used.  For a more rapid response, add gentle compression to the ice.  Ice for no more than 10 to 20 minutes at a time. The bonier the area you are icing, the less time it will take to get the benefits of ice.  Check your skin after 5 minutes to make sure there are no signs of a poor response to cold or skin damage.  Rest 20 minutes or more between uses.  Once your skin is numb, you can end your treatment. You can test numbness by very lightly touching your skin. The touch should be so light that you do not see the skin dimple from the pressure of your fingertip. When using ice, most people will feel these normal sensations in this order: cold, burning, aching, and  numbness.  Do not use ice on someone who cannot communicate their responses to pain, such as small children or people with dementia. HOW TO MAKE AN ICE PACK Ice packs are the most common way to use ice therapy. Other methods include ice massage, ice baths, and cryosprays. Muscle creams that cause a cold, tingly feeling do not offer the same benefits that ice offers and should not be used as a substitute unless recommended by your caregiver. To make  an ice pack, do one of the following:  Place crushed ice or a bag of frozen vegetables in a sealable plastic bag. Squeeze out the excess air. Place this bag inside another plastic bag. Slide the bag into a pillowcase or place a damp towel between your skin and the bag.  Mix 3 parts water with 1 part rubbing alcohol. Freeze the mixture in a sealable plastic bag. When you remove the mixture from the freezer, it will be slushy. Squeeze out the excess air. Place this bag inside another plastic bag. Slide the bag into a pillowcase or place a damp towel between your skin and the bag. SEEK MEDICAL CARE IF:  You develop white spots on your skin. This may give the skin a blotchy (mottled) appearance.  Your skin turns blue or pale.  Your skin becomes waxy or hard.  Your swelling gets worse. MAKE SURE YOU:   Understand these instructions.  Will watch your condition.  Will get help right away if you are not doing well or get worse.   This information is not intended to replace advice given to you by your health care provider. Make sure you discuss any questions you have with your health care provider.   Document Released: 12/22/2010 Document Revised: 05/18/2014 Document Reviewed: 12/22/2010 Elsevier Interactive Patient Education 2016 [employer]  Hematoma A hematoma is a collection of blood under the skin, in an organ, in a body space, in a joint space, or in other tissue. The blood can clot to form a lump that you can see and feel. The lump is  often firm and may sometimes become sore and tender. Most hematomas get better in a few days to weeks. However, some hematomas may be serious and require medical care. Hematomas can range in size from very small to very large. CAUSES  A hematoma can be caused by a blunt or penetrating injury. It can also be caused by spontaneous leakage from a blood vessel under the skin. Spontaneous leakage from a blood vessel is more likely to occur in older people, especially those taking blood thinners. Sometimes, a hematoma can develop after certain medical procedures. SIGNS AND SYMPTOMS   A firm lump on the body.  Possible pain and tenderness in the area.  Bruising.Blue, dark blue, purple-red, or yellowish skin may appear at the site of the hematoma if the hematoma is close to the surface of the skin. For hematomas in deeper tissues or body spaces, the signs and symptoms may be subtle. For example, an intra-abdominal hematoma may cause abdominal pain, weakness, fainting, and shortness of breath. An intracranial hematoma may cause a headache or symptoms such as weakness, trouble speaking, or a change in consciousness. DIAGNOSIS  A hematoma can usually be diagnosed based on your medical history and a physical exam. Imaging tests may be needed if your health care provider suspects a hematoma in deeper tissues or body spaces, such as the abdomen, head, or chest. These tests may include ultrasonography or a CT scan.  TREATMENT  Hematomas usually go away on their own over time. Rarely does the blood need to be drained out of the body. Large hematomas or those that may affect vital organs will sometimes need surgical drainage or monitoring. HOME CARE INSTRUCTIONS   Apply ice to the injured area:   Put ice in a plastic bag.   Place a towel between your skin and the bag.   Leave the ice on for 20 minutes, 2-3 times a day for  the first 1 to 2 days.   After the first 2 days, switch to using warm compresses  on the hematoma.   Elevate the injured area to help decrease pain and swelling. Wrapping the area with an elastic bandage may also be helpful. Compression helps to reduce swelling and promotes shrinking of the hematoma. Make sure the bandage is not wrapped too tight.   If your hematoma is on a lower extremity and is painful, crutches may be helpful for a couple days.   Only take over-the-counter or prescription medicines as directed by your health care provider. SEEK IMMEDIATE MEDICAL CARE IF:   You have increasing pain, or your pain is not controlled with medicine.   You have a fever.   You have worsening swelling or discoloration.   Your skin over the hematoma breaks or starts bleeding.   Your hematoma is in your chest or abdomen and you have weakness, shortness of breath, or a change in consciousness.  Your hematoma is on your scalp (caused by a fall or injury) and you have a worsening headache or a change in alertness or consciousness. MAKE SURE YOU:   Understand these instructions.  Will watch your condition.  Will get help right away if you are not doing well or get worse.   This information is not intended to replace advice given to you by your health care provider. Make sure you discuss any questions you have with your health care provider.   Document Released: 12/10/2003 Document Revised: 12/28/2012 Document Reviewed: 10/05/2012 Elsevier Interactive Patient Education [employer].

## 2015-05-23 NOTE — ED Notes (Signed)
Pt presents with laceration to right eyebrow area. Swelling noted to right forehead. Bruising noted to left forearm and thumb. Bruising noted to right wrist. Pt states tripped over her shoe. Pt states fell forward and tried to catch herself with her hands. Pt denies LOC. Pt alert and oriented x4. Pt reports pain in her left arm and shoulder.

## 2015-05-23 NOTE — Telephone Encounter (Signed)
Patient was at dentist office this morning and fell going out, busting up her face.  Patient on her way to ED for stitches and possible scan.

## 2015-05-23 NOTE — ED Notes (Signed)
Pt was walking out of her dentist office when she tripped over her shoe that is falling apart. Pt has lac to rt forehead and left wrist bruising. Pt denies LOC or use of blood thinner. A/O x 4.

## 2015-05-23 NOTE — ED Provider Notes (Signed)
[facility] Emergency Department Provider Note  ____________________________________________  Time seen: Approximately 1:08 PM  I have reviewed the triage vital signs and the nursing notes.   HISTORY  Chief Complaint Fall and Head Laceration    HPI Wendy Giles is a 82 y.o. female who presents to the emergency department complaining of a fall with laceration above her right eye and left wrist pain. Patient states that she was walking out of the dentist office when she tripped over her shoes causing her to fall forward onto both hands. She states that she is having pain to the lateral aspect of the left wrist. She denies any pain to the right wrist. Patient did hit her head causing a laceration superior to right eyebrow. Bleeding is controlled prior to arrival. Patient does endorse using Plavix. Patient reports bruising to left wrist as well as above right eye. She denies any loss of consciousness. She denies any dizziness. She denies any neck pain, chest pain, shortness of breath, abdominal pain, nausea or vomiting.   Past Medical History  Diagnosis Date  . CHF (congestive heart failure) ([place])   . Diabetes mellitus without complication ([place])   . Hypertension   . Presence of permanent cardiac pacemaker   . Dysrhythmia     afib  . Depression   . GERD (gastroesophageal reflux disease)   . Coronary artery disease   . Cancer ([place])     breast,uterine  . COPD (chronic obstructive pulmonary disease) ([place])   . Arthritis   . Anemia   . Anginal pain ([place])   . Cancer of upper lobe of right lung ([place]) 11/03/2014  . Breast cancer [facility])     Patient Active Problem List   Diagnosis Date Noted  . Cancer of upper lobe of right lung ([place]) 11/03/2014  . History of biliary T-tube placement 10/29/2014  . Mass of lung   . Breath shortness 01/02/2014  . Artificial cardiac pacemaker 01/01/2014  . Diabetes ([place]) 01/01/2014  . H/O cardiac catheterization 01/01/2014   . History of cardioversion 01/01/2014  . S/P coronary artery balloon dilation 01/01/2014  . Paroxysmal atrial fibrillation ([place]) 01/01/2014    Past Surgical History  Procedure Laterality Date  . Cholecystectomy    . Coronary angioplasty    . Coronary artery bypass graft    . Appendectomy    . Mastectomy    . Ablation of dysrhythmic focus    . Cardioversion    . Joint replacement      tkr  . Insert / replace / remove pacemaker    . Peripheral vascular catheterization N/A 11/05/2014    Procedure: Glori Luis Cath Insertion;  Surgeon: Algernon Huxley, MD;  Location: [facility];  Service: Cardiovascular;  Laterality: N/A;  . Endobronchial ultrasound N/A 10/24/2014    Procedure: ENDOBRONCHIAL ULTRASOUND;  Surgeon: Vilinda Boehringer, MD;  Location: [facility] ORS;  Service: Cardiopulmonary;  Laterality: N/A;    Current Outpatient Rx  Name  Route  Sig  Dispense  Refill  . albuterol (PROAIR HFA) 108 (90 BASE) MCG/ACT inhaler   Inhalation   Inhale into the lungs.         . ALPRAZolam (XANAX) 0.5 MG tablet   Oral   Take 0.5 mg by mouth once a week.          Marland Kitchen aspirin 81 MG tablet   Oral   Take 81 mg by mouth daily.          Marland Kitchen bismuth subsalicylate (KAOPECTATE) 262  MG/15ML suspension   Oral   Take 30 mLs by mouth 4 (four) times daily.   360 mL   0   . cyclobenzaprine (FLEXERIL) 5 MG tablet   Oral   Take 5 mg by mouth daily as needed for muscle spasms.          . dabigatran (PRADAXA) 150 MG CAPS capsule      TAKE ONE CAPSULE BY MOUTH TWICE DAILY         . dicyclomine (BENTYL) 10 MG capsule   Oral   Take 10 mg by mouth 3 (three) times daily as needed (abdominal pain).          Marland Kitchen diphenoxylate-atropine (LOMOTIL) 2.5-0.025 MG tablet   Oral   Take 1 tablet by mouth 4 (four) times daily as needed for diarrhea or loose stools.   30 tablet   0   . fentaNYL (DURAGESIC - DOSED MCG/HR) 12 MCG/HR   Transdermal   Place 1 patch (12.5 mcg total) onto the skin every 3 (three)  days.   10 patch   0   . furosemide (LASIX) 40 MG tablet   Oral   Take 40 mg by mouth 2 (two) times daily.          Marland Kitchen gabapentin (NEURONTIN) 100 MG capsule   Oral   Take 100 mg by mouth every morning.          Marland Kitchen glipiZIDE (GLUCOTROL) 10 MG tablet   Oral   Take 10 mg by mouth 2 (two) times daily before a meal.       0   . insulin lispro (HUMALOG) 100 UNIT/ML KiwkPen      as per sliding scale< than 150-- No insulin150-200---4 units 201-250---6 units251-300--- 8 units301-350----10 units.351-400---12 units         . isosorbide mononitrate (IMDUR) 60 MG 24 hr tablet   Oral   Take 60 mg by mouth daily.          Marland Kitchen levofloxacin (LEVAQUIN) 500 MG tablet   Oral   Take 1 tablet (500 mg total) by mouth daily.   7 tablet   0   . levothyroxine (SYNTHROID, LEVOTHROID) 25 MCG tablet   Oral   Take 25 mcg by mouth daily before breakfast.          . lidocaine-prilocaine (EMLA) cream   Topical   Apply 1 application topically as needed.   30 g   3   . Liraglutide 18 MG/3ML SOPN   Subcutaneous   Inject 3 mLs into the skin daily.          Marland Kitchen losartan (COZAAR) 100 MG tablet   Oral   Take 100 mg by mouth daily.          . magic mouthwash w/lidocaine SOLN   Oral   Take 5 mLs by mouth 4 (four) times daily as needed for mouth pain.   480 mL   3   . Magnesium-Calcium-Folic Acid 379-024-0 MG TABS   Oral   Take 1 tablet by mouth daily.          . metFORMIN (GLUCOPHAGE) 1000 MG tablet   Oral   Take 1,000 mg by mouth 2 (two) times daily.          . metoprolol-hydrochlorothiazide (LOPRESSOR HCT) 50-25 MG per tablet   Oral   Take 1 tablet by mouth daily.          . metroNIDAZOLE (FLAGYL) 500 MG tablet   Oral  Take 1 tablet (500 mg total) by mouth 3 (three) times daily.   21 tablet   0   . nitroGLYCERIN (NITROSTAT) 0.4 MG SL tablet   Sublingual   Place 0.4 mg under the tongue every 5 (five) minutes as needed for chest pain.          Marland Kitchen ondansetron  (ZOFRAN) 8 MG tablet   Oral   Take 1 tablet (8 mg total) by mouth 2 (two) times daily. Start the day after chemo for 3 days. Then take as needed for nausea or vomiting.   30 tablet   2   . ondansetron (ZOFRAN-ODT) 4 MG disintegrating tablet   Oral   Take 1 tablet (4 mg total) by mouth every 8 (eight) hours as needed for nausea or vomiting.   20 tablet   0   . oxyCODONE (OXY IR/ROXICODONE) 5 MG immediate release tablet   Oral   Take 1 tablet (5 mg total) by mouth every 4 (four) hours as needed for severe pain.   60 tablet   0   . oxycodone (ROXICODONE) 30 MG immediate release tablet   Oral   Take 1 tablet (30 mg total) by mouth every 6 (six) hours as needed for severe pain.   20 tablet   0   . potassium chloride (K-DUR) 10 MEQ tablet   Oral   Take 2 tablets (20 mEq total) by mouth daily.   60 tablet   0   . promethazine (PHENERGAN) 25 MG tablet      TAKE ONE TABLET BY MOUTH EVERY SIX HOURSAS NEEDED FOR NAUSEA OR VOMITING   30 tablet   0   . RABEprazole (ACIPHEX) 20 MG tablet   Oral   Take 20 mg by mouth daily.          Marland Kitchen senna-docusate (SENNA S) 8.6-50 MG tablet   Oral   Take 1 tablet by mouth 2 (two) times daily.   60 tablet   3   . simvastatin (ZOCOR) 20 MG tablet   Oral   Take 20 mg by mouth every evening.            Allergies Amoxicillin-pot clavulanate; Fentanyl; Codeine; Latex; and Other  No family history on file.  Social History Social History  Substance Use Topics  . Smoking status: Former Smoker    Quit date: 10/21/1997  . Smokeless tobacco: Never Used  . Alcohol Use: No     Review of Systems  Constitutional: No fever/chills Eyes: No visual changes. No discharge ENT: No sore throat. Cardiovascular: no chest pain. Respiratory: no cough. No SOB. Gastrointestinal: No abdominal pain.  No nausea, no vomiting.  No diarrhea.  No constipation. Genitourinary: Negative for dysuria. No hematuria Musculoskeletal: Negative for back pain.  Nurse's left wrist pain. Skin: Negative for rash. Endorses laceration above right eye. Neurological: Positive for headache but denies, focal weakness or numbness. 10-point ROS otherwise negative.  ____________________________________________   PHYSICAL EXAM:  VITAL SIGNS: ED Triage Vitals  Enc Vitals Group     BP 05/23/15 1133 149/81 mmHg     Pulse Rate 05/23/15 1133 64     Resp 05/23/15 1133 18     Temp 05/23/15 1133 98 F (36.7 C)     Temp Source 05/23/15 1133 Oral     SpO2 05/23/15 1133 96 %     Weight 05/23/15 1133 146 lb (66.225 kg)     Height 05/23/15 1133 '5\' 3"'$  (1.6 m)     Head  Cir --      Peak Flow --      Pain Score --      Pain Loc --      Pain Edu? --      Excl. in [place]? --      Constitutional: Alert and oriented. Well appearing and in no acute distress. Eyes: Conjunctivae are normal. PERRL. EOMI. Head: Large hematoma noted to right frontal bone. Laceration is noted in this area as well. No crepitus to joint lines. ENT:      Ears:       Nose: No congestion/rhinnorhea.      Mouth/Throat: Mucous membranes are moist.  Neck: No stridor.  No cervical spine tenderness to palpation. Hematological/Lymphatic/Immunilogical: No cervical lymphadenopathy. Cardiovascular: Normal rate, regular rhythm. Normal S1 and S2.  Good peripheral circulation. Respiratory: Normal respiratory effort without tachypnea or retractions. Lungs CTAB. Gastrointestinal: Soft and nontender. No distention. No CVA tenderness. Musculoskeletal: No lower extremity tenderness nor edema.  No joint effusions. Ecchymosis noted to anterior left wrist. No visible deformity to her wrist. On her edema noted. Full range of motion to wrist. Radial pulse intact. Sensation intact all 5 digits and equal to unaffected extremity. Neurologic:  Normal speech and language. No gross focal neurologic deficits are appreciated. Radial nerves II through XII grossly intact. Sensation intact 4 extremities. Skin:  Skin is warm,  dry and intact. No rash noted. Linear laceration noted superior right eyebrow. Edges are straight and clean. No visible foreign body. Laceration is approximately 1.5 cm in length Psychiatric: Mood and affect are normal. Speech and behavior are normal. Patient exhibits appropriate insight and judgement.   ____________________________________________   LABS (all labs ordered are listed, but only abnormal results are displayed)  Labs Reviewed - No data to display ____________________________________________  EKG   ____________________________________________  RADIOLOGY Diamantina Providence Gor Vestal, personally viewed and evaluated these images (plain radiographs) as part of my medical decision making, as well as reviewing the written report by the radiologist.  Dg Wrist Complete Left  05/23/2015  CLINICAL DATA:  [place] on outstretched hand.  Injury and bruising EXAM: LEFT WRIST - COMPLETE 3+ VIEW COMPARISON:  None. FINDINGS: Negative for acute fracture. Chronic appearing fracture of the ulnar styloid. Moderate to advanced degenerative change to the base of thumb with joint space narrowing subluxation and bone fragmentation. IMPRESSION: Negative for acute fracture. Electronically Signed   By: Franchot Gallo M.D.   On: 05/23/2015 14:32   Ct Head Wo Contrast  05/23/2015  CLINICAL DATA:  Fall with head trauma, with right frontal hematoma. Anticoagulated on Plavix. Small cell lung cancer. EXAM: CT HEAD WITHOUT CONTRAST TECHNIQUE: Contiguous axial images were obtained from the base of the skull through the vertex without intravenous contrast. COMPARISON:  11/06/2014 head CT . FINDINGS: There is a right frontal scalp/right periorbital contusion. No soft tissue abnormality in the visualized intraconal region. No evidence of parenchymal hemorrhage or extra-axial fluid collection. No mass lesion, mass effect, or midline shift. No CT evidence of acute infarction. Intracranial atherosclerosis. Nonspecific stable  subcortical and periventricular white matter hypodensity, most in keeping with chronic small vessel ischemic change. Diffuse cerebral volume loss. Stable small lacunar infarcts in the left basal ganglia. No ventriculomegaly. The visualized paranasal sinuses are essentially clear. The mastoid air cells are unopacified. No evidence of calvarial fracture. IMPRESSION: 1. Right frontal scalp/right periorbital contusion. 2. No evidence of acute intracranial abnormality. No calvarial fracture. 3. Intracranial atherosclerosis, cerebral atrophy, small left basal ganglia lacunar infarcts and chronic  small vessel ischemic white matter change, all stable. Electronically Signed   By: Ilona Sorrel M.D.   On: 05/23/2015 13:35    ____________________________________________    PROCEDURES  Procedure(s) performed:    LACERATION REPAIR Performed by: Darletta Moll Authorized by: Charline Bills Aaliyah Cancro Consent: Verbal consent obtained. Risks and benefits: risks, benefits and alternatives were discussed Consent given by: patient Patient identity confirmed: provided demographic data Prepped and Draped in normal sterile fashion Wound explored  Laceration Location: Superior right eyebrow  Laceration Length: 1.5 cm  No Foreign Bodies seen or palpated  Anesthesia: local infiltration  Local anesthetic: lidocaine 1 % with epinephrine  Anesthetic total: 5 ml  Irrigation method: syringe Amount of cleaning: standard  Skin closure: 6-0 Ethilon sutures   Number of sutures: 3   Technique: Area was anesthetized using infiltration. Wound was primarily closed using simple interrupted sutures. Patient tolerated procedure well.   Patient tolerance: Patient tolerated the procedure well with no immediate complications.    Medications  lidocaine-EPINEPHrine (XYLOCAINE-EPINEPHrine) 1 %-1:200000 (PF) injection (not administered)     ____________________________________________   INITIAL IMPRESSION /  ASSESSMENT AND PLAN / ED COURSE  Pertinent labs & imaging results that were available during my care of the patient were reviewed by me and considered in my medical decision making (see chart for details).  Patient's diagnosis is consistent with a fall causing left wrist contusion and laceration above right eye.. Patient will be discharged home with prescriptions for pain medication. Patient is taking oxycodone 30 mg by mouth as needed for pain. She does have a prescription that she is almost out of from the cancer center. Patient is given prescription for pain medication and same dosing for symptom control. Patient is to follow up with primary care provider in one week for suture removal or follow-up with them sooner if symptoms persist past this treatment course. Patient is given ED precautions to return to the ED for any worsening or new symptoms.     ____________________________________________  FINAL CLINICAL IMPRESSION(S) / ED DIAGNOSES  Final diagnoses:  Fall, initial encounter  Wrist contusion, left, initial encounter  Facial contusion, initial encounter  Laceration of face, initial encounter      NEW MEDICATIONS STARTED DURING THIS VISIT:  Discharge Medication List as of 05/23/2015  2:47 PM    START taking these medications   Details  ondansetron (ZOFRAN-ODT) 4 MG disintegrating tablet Take 1 tablet (4 mg total) by mouth every 8 (eight) hours as needed for nausea or vomiting., Starting 05/23/2015, Until Discontinued, Print    !! oxycodone (ROXICODONE) 30 MG immediate release tablet Take 1 tablet (30 mg total) by mouth every 6 (six) hours as needed for severe pain., Starting 05/23/2015, Until Discontinued, Print     !! - Potential duplicate medications found. Please discuss with provider.          Charline Bills Melyna Huron, PA-C 05/23/15 1519  Carrie Mew, MD 05/23/15 1537

## 2015-05-27 ENCOUNTER — Inpatient Hospital Stay: Payer: Medicare Other

## 2015-05-27 ENCOUNTER — Inpatient Hospital Stay: Payer: Medicare Other | Admitting: Oncology

## 2015-05-29 ENCOUNTER — Telehealth: Payer: Self-pay | Admitting: *Deleted

## 2015-05-29 ENCOUNTER — Other Ambulatory Visit: Payer: Self-pay | Admitting: *Deleted

## 2015-05-29 DIAGNOSIS — C3411 Malignant neoplasm of upper lobe, right bronchus or lung: Secondary | ICD-10-CM

## 2015-05-29 MED ORDER — FENTANYL 12 MCG/HR TD PT72: 12.5000 ug | MEDICATED_PATCH | TRANSDERMAL | Status: DC

## 2015-05-29 NOTE — Telephone Encounter (Signed)
-----   Message from Cephus Richer sent at 05/29/2015  4:34 PM EST ----- Contact: [phone] Pt need refill on pain patches.

## 2015-05-29 NOTE — Telephone Encounter (Signed)
States that they were told SW would call to discuss supportive care for her and her husband at home, but has not heard form anyone regarding this as of yet

## 2015-05-29 NOTE — Telephone Encounter (Signed)
I spoke with Wendy Giles regarding this matter and she will see if she can call pt, she has no Cancer Center hours right now and [employer] is out of the office at this time. Patient informed that someone will be calling her  As soon as possible

## 2015-05-31 ENCOUNTER — Telehealth: Payer: Self-pay | Admitting: *Deleted

## 2015-05-31 NOTE — Telephone Encounter (Signed)
Contacted patient regarding anticipating social worker contact and assistance needed. Patient says she is doing better now and does not need any help at this time. She may contact the patient services navigator in the future and knows how to do so.

## 2015-06-03 ENCOUNTER — Telehealth: Payer: Self-pay | Admitting: *Deleted

## 2015-06-03 MED ORDER — OXYCODONE HCL 5 MG PO TABS: 5.0000 mg | ORAL_TABLET | ORAL | Status: DC | PRN

## 2015-06-03 NOTE — Telephone Encounter (Signed)
Informed that prescription is ready to pick up  

## 2015-06-04 ENCOUNTER — Encounter: Payer: Self-pay | Admitting: Oncology

## 2015-06-04 ENCOUNTER — Telehealth: Payer: Self-pay | Admitting: *Deleted

## 2015-06-04 ENCOUNTER — Inpatient Hospital Stay: Payer: Medicare Other

## 2015-06-04 ENCOUNTER — Inpatient Hospital Stay (HOSPITAL_BASED_OUTPATIENT_CLINIC_OR_DEPARTMENT_OTHER): Payer: Medicare Other | Admitting: Oncology

## 2015-06-04 VITALS — BP 124/83 | HR 65 | Temp 96.6°F | Resp 18 | Wt 140.9 lb

## 2015-06-04 DIAGNOSIS — C50919 Malignant neoplasm of unspecified site of unspecified female breast: Secondary | ICD-10-CM

## 2015-06-04 DIAGNOSIS — M545 Low back pain: Secondary | ICD-10-CM

## 2015-06-04 DIAGNOSIS — C7971 Secondary malignant neoplasm of right adrenal gland: Secondary | ICD-10-CM

## 2015-06-04 DIAGNOSIS — Z800 Family history of malignant neoplasm of digestive organs: Secondary | ICD-10-CM

## 2015-06-04 DIAGNOSIS — E119 Type 2 diabetes mellitus without complications: Secondary | ICD-10-CM

## 2015-06-04 DIAGNOSIS — C7972 Secondary malignant neoplasm of left adrenal gland: Secondary | ICD-10-CM

## 2015-06-04 DIAGNOSIS — Z803 Family history of malignant neoplasm of breast: Secondary | ICD-10-CM

## 2015-06-04 DIAGNOSIS — R630 Anorexia: Secondary | ICD-10-CM

## 2015-06-04 DIAGNOSIS — C787 Secondary malignant neoplasm of liver and intrahepatic bile duct: Secondary | ICD-10-CM

## 2015-06-04 DIAGNOSIS — C3411 Malignant neoplasm of upper lobe, right bronchus or lung: Secondary | ICD-10-CM

## 2015-06-04 DIAGNOSIS — Z794 Long term (current) use of insulin: Secondary | ICD-10-CM

## 2015-06-04 DIAGNOSIS — J90 Pleural effusion, not elsewhere classified: Secondary | ICD-10-CM

## 2015-06-04 DIAGNOSIS — K449 Diaphragmatic hernia without obstruction or gangrene: Secondary | ICD-10-CM

## 2015-06-04 DIAGNOSIS — C7802 Secondary malignant neoplasm of left lung: Secondary | ICD-10-CM

## 2015-06-04 DIAGNOSIS — I10 Essential (primary) hypertension: Secondary | ICD-10-CM

## 2015-06-04 DIAGNOSIS — D649 Anemia, unspecified: Secondary | ICD-10-CM

## 2015-06-04 DIAGNOSIS — Z79899 Other long term (current) drug therapy: Secondary | ICD-10-CM

## 2015-06-04 DIAGNOSIS — Z86010 Personal history of colonic polyps: Secondary | ICD-10-CM

## 2015-06-04 DIAGNOSIS — Z9221 Personal history of antineoplastic chemotherapy: Secondary | ICD-10-CM

## 2015-06-04 DIAGNOSIS — K219 Gastro-esophageal reflux disease without esophagitis: Secondary | ICD-10-CM

## 2015-06-04 DIAGNOSIS — Z87891 Personal history of nicotine dependence: Secondary | ICD-10-CM

## 2015-06-04 DIAGNOSIS — I2510 Atherosclerotic heart disease of native coronary artery without angina pectoris: Secondary | ICD-10-CM

## 2015-06-04 DIAGNOSIS — R1011 Right upper quadrant pain: Secondary | ICD-10-CM

## 2015-06-04 DIAGNOSIS — R5383 Other fatigue: Secondary | ICD-10-CM

## 2015-06-04 DIAGNOSIS — J449 Chronic obstructive pulmonary disease, unspecified: Secondary | ICD-10-CM

## 2015-06-04 DIAGNOSIS — Z7982 Long term (current) use of aspirin: Secondary | ICD-10-CM

## 2015-06-04 DIAGNOSIS — R531 Weakness: Secondary | ICD-10-CM

## 2015-06-04 DIAGNOSIS — F329 Major depressive disorder, single episode, unspecified: Secondary | ICD-10-CM

## 2015-06-04 DIAGNOSIS — Z7984 Long term (current) use of oral hypoglycemic drugs: Secondary | ICD-10-CM

## 2015-06-04 DIAGNOSIS — R58 Hemorrhage, not elsewhere classified: Secondary | ICD-10-CM

## 2015-06-04 DIAGNOSIS — Z170 Estrogen receptor positive status [ER+]: Secondary | ICD-10-CM

## 2015-06-04 DIAGNOSIS — I4891 Unspecified atrial fibrillation: Secondary | ICD-10-CM

## 2015-06-04 DIAGNOSIS — H9319 Tinnitus, unspecified ear: Secondary | ICD-10-CM

## 2015-06-04 DIAGNOSIS — I509 Heart failure, unspecified: Secondary | ICD-10-CM

## 2015-06-04 DIAGNOSIS — M1990 Unspecified osteoarthritis, unspecified site: Secondary | ICD-10-CM

## 2015-06-04 DIAGNOSIS — M129 Arthropathy, unspecified: Secondary | ICD-10-CM

## 2015-06-04 DIAGNOSIS — R590 Localized enlarged lymph nodes: Secondary | ICD-10-CM

## 2015-06-04 DIAGNOSIS — G8929 Other chronic pain: Secondary | ICD-10-CM

## 2015-06-04 LAB — COMPREHENSIVE METABOLIC PANEL
ALBUMIN: 3.2 g/dL — AB (ref 3.5–5.0)
ALT: 19 U/L (ref 14–54)
ANION GAP: 7 (ref 5–15)
AST: 39 U/L (ref 15–41)
Alkaline Phosphatase: 379 U/L — ABNORMAL HIGH (ref 38–126)
BUN: 19 mg/dL (ref 6–20)
CO2: 25 mmol/L (ref 22–32)
Calcium: 8.6 mg/dL — ABNORMAL LOW (ref 8.9–10.3)
Chloride: 99 mmol/L — ABNORMAL LOW (ref 101–111)
Creatinine, Ser: 0.82 mg/dL (ref 0.44–1.00)
GFR calc Af Amer: >60 mL/min (ref >60)
GFR calc non Af Amer: >60 mL/min (ref >60)
GLUCOSE: 217 mg/dL — AB (ref 65–99)
POTASSIUM: 3.9 mmol/L (ref 3.5–5.1)
SODIUM: 131 mmol/L — AB (ref 135–145)
Total Bilirubin: 1.2 mg/dL (ref 0.3–1.2)
Total Protein: 6.5 g/dL (ref 6.5–8.1)

## 2015-06-04 LAB — CBC WITH DIFFERENTIAL/PLATELET
Basophils Absolute: 0.1 10*3/uL (ref 0–0.1)
Basophils Relative: 1 %
EOS PCT: 1 %
Eosinophils Absolute: 0.1 10*3/uL (ref 0–0.7)
HEMATOCRIT: 36.3 % (ref 35.0–47.0)
Hemoglobin: 11.7 g/dL — ABNORMAL LOW (ref 12.0–16.0)
LYMPHS ABS: 0.7 10*3/uL — AB (ref 1.0–3.6)
LYMPHS PCT: 8 %
MCH: 28.6 pg (ref 26.0–34.0)
MCHC: 32.1 g/dL (ref 32.0–36.0)
MCV: 89 fL (ref 80.0–100.0)
MONO ABS: 0.7 10*3/uL (ref 0.2–0.9)
MONOS PCT: 8 %
NEUTROS ABS: 7.4 10*3/uL — AB (ref 1.4–6.5)
Neutrophils Relative %: 82 %
Platelets: 219 10*3/uL (ref 150–440)
RBC: 4.08 MIL/uL (ref 3.80–5.20)
RDW: 18.8 % — AB (ref 11.5–14.5)
WBC: 9 10*3/uL (ref 3.6–11.0)

## 2015-06-04 MED ORDER — OXYCODONE HCL 5 MG PO TABS: 5.0000 mg | ORAL_TABLET | Freq: Once | ORAL | Status: DC

## 2015-06-04 MED ORDER — ONDANSETRON HCL 4 MG PO TABS: 4.0000 mg | ORAL_TABLET | Freq: Four times a day (QID) | ORAL | Status: AC | PRN

## 2015-06-04 MED ORDER — OXYCODONE HCL 5 MG PO TABS
5.0000 mg | ORAL_TABLET | Freq: Once | ORAL | Status: AC
  Administered 2015-06-04: 5 mg via ORAL
  Filled 2015-06-04: qty 1

## 2015-06-04 MED ORDER — SODIUM CHLORIDE 0.9 % IJ SOLN
10.0000 mL | INTRAMUSCULAR | Status: DC | PRN
  Administered 2015-06-04: 10 mL via INTRAVENOUS
  Filled 2015-06-04: qty 10

## 2015-06-04 MED ORDER — SODIUM CHLORIDE 0.9 % IV SOLN
240.0000 mg | Freq: Once | INTRAVENOUS | Status: AC
  Administered 2015-06-04: 240 mg via INTRAVENOUS
  Filled 2015-06-04: qty 8

## 2015-06-04 MED ORDER — HEPARIN SOD (PORK) LOCK FLUSH 100 UNIT/ML IV SOLN
500.0000 [IU] | Freq: Once | INTRAVENOUS | Status: AC
  Administered 2015-06-04: 500 [IU] via INTRAVENOUS
  Filled 2015-06-04: qty 5

## 2015-06-04 MED ORDER — SODIUM CHLORIDE 0.9 % IV SOLN
Freq: Once | INTRAVENOUS | Status: AC
  Administered 2015-06-04: 11:00:00 via INTRAVENOUS
  Filled 2015-06-04: qty 1000

## 2015-06-04 NOTE — Telephone Encounter (Signed)
Called to inform us that ondansetron has been approved for one year.

## 2015-06-04 NOTE — Progress Notes (Signed)
Patient requesting refill for Zofran

## 2015-06-05 ENCOUNTER — Encounter: Payer: Self-pay | Admitting: Oncology

## 2015-06-05 NOTE — Progress Notes (Signed)
[place] @ [facility] [phone]  [phone]     Wendy Giles OB: Feb 22, 1933  MR#: [id number]  NKN#:[id number]  Patient Care Team: Tracie Harrier, MD as PCP - General (Internal Medicine) Isaias Cowman, MD as Consulting Physician (Cardiology)  CHIEF COMPLAINT:  Chief Complaint  Patient presents with  . Lung Cancer       Carcinoma of breast                                AJCC Staging:  [id number]                               Stage Grouping: I                               Cancer Status: Evidence of disease.                               Estrogen receptor positive and                               progesterone receptor positive. HER- 2                               Negative. 2.  Abnormal CT scan of the chest (June, 2016) Oncology History   1.  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  Previous history of carcinoma breast stage I disease 3.  Started on chemotherapy with carboplatinum and VP-16 from November 20, 2014 MRI scan of brain was negative (July, 2016)   Patient was started on carboplatinum and VP-16 which has been discontinued in September of 2016 (received total of 3 cycles of chemotherapy) CT scan did not see any significant response 4, patient will be started on carboplatinum and CPT-11 from January 28, 2015  5.  Patient had severe diarrhea so carboplatinum CPT-11 has been discontinued patient has been started on Taxotere and gemcitabine combination from November of 2016 6.  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  Had a CT scan done patient is here for ongoing evaluation and treatment consideration Poor appetite. June 04, 2015 Patient fell and had injury to the right side of the face.  Patient fell in dental office.  Did not pass out did not have any seizure.  Slipped on the carpet. And tinnitus to right upper quadrant pain.  Patient is here to initiate chemotherapy with   Nivolulamab.  Patient's condition is somewhat declining.  REVIEW OF SYSTEMS:    general status: Patient is feeling weak and tired.  No change in a performance status.  No chills.  No fever. HEENT: Alopecia.  No evidence of stomatitis Chest: Dry hacking cough no chills or fever Lungs:  shortness of breath and exertion Had thoracentesis and fluid was positive for small cell undifferentiated carcinoma of lung Cardiac: No chest pain or paroxysmal nocturnal dyspnea GI: Diarrhea has improved Skin: Poor skin turgor Lower extremity welling 1+ Neurological system: No tingling.  No numbness.  No other focal signs Musculoskeletal system no bony pains  Genitourinary system: No dysuria.  No hematuria.  No frequency.  No flank pain. Neurological system: No dizziness.  No tingling.  His was.  no tingling numbness.  no focal weakness or any focal signs. Depression has improved ve.  PAST MEDICAL HISTORY: Past Medical History  Diagnosis Date  . CHF (congestive heart failure) ([place])   . Diabetes mellitus without complication ([place])   . Hypertension   . Presence of permanent cardiac pacemaker   . Dysrhythmia     afib  . Depression   . GERD (gastroesophageal reflux disease)   . Coronary artery disease   . Cancer ([place])     breast,uterine  . COPD (chronic obstructive pulmonary disease) ([place])   . Arthritis   . Anemia   . Anginal pain ([place])   . Cancer of upper lobe of right lung ([place]) 11/03/2014  . Breast cancer [facility])       Surgical History            Diabetes controlled with oral  medication                               Previous history of congestive heart                               failure                               Coronary artery disease with stent                               placement and bypass surgery Hypertension                               Osteoarthritis                               Colon polyps                               Chronic low back pain                                                              Past Surgical History:                               Coronary bypass surgery and stent                               placement  Pacemaker because of atrial fibrillation                               Appendectomy                               Hemorrhoidectomy                                                              Family History:                               Family history of breast cancer and colon                               cancer. History of anemia, diabetes, heart                               disease, and hypertension in the family.                                                              Social History:                               Does not smoke now. Does not drink. Used                               to smoke in the pa ADVANCED DIRECTIVES:  No flowsheet data found.  HEALTH MAINTENANCE: Social History  Substance Use Topics  . Smoking status: Former Smoker    Quit date: 10/21/1997  . Smokeless tobacco: Never Used  . Alcohol Use: No      Allergies  Allergen Reactions  . Amoxicillin-Pot Clavulanate Hives and Diarrhea  . Fentanyl Itching  . Codeine Nausea And Vomiting and Other (See Comments)    Other Reaction: "very ill"  . Latex Rash  . Other Rash    Current Outpatient Prescriptions  Medication Sig Dispense Refill  . albuterol (PROAIR HFA) 108 (90 BASE) MCG/ACT inhaler Inhale into the lungs.    . ALPRAZolam (XANAX) 0.5 MG tablet Take 0.5 mg by mouth once a week.     Marland Kitchen  aspirin 81 MG tablet Take 81 mg by mouth daily.     Marland Kitchen bismuth subsalicylate (KAOPECTATE) 262 MG/15ML suspension Take 30 mLs by mouth 4 (four) times daily. 360 mL 0  . cyclobenzaprine (FLEXERIL) 5 MG tablet Take 5 mg by mouth daily as needed for muscle spasms.     . dabigatran (PRADAXA) 150 MG CAPS capsule TAKE ONE CAPSULE BY MOUTH TWICE DAILY    . dicyclomine (BENTYL) 10 MG capsule Take 10 mg by mouth 3 (three) times daily as needed (abdominal pain).     Marland Kitchen  diphenoxylate-atropine (LOMOTIL) 2.5-0.025 MG tablet Take 1 tablet by mouth 4 (four) times daily as needed for diarrhea or loose stools. 30 tablet 0  . fentaNYL (DURAGESIC - DOSED MCG/HR) 12 MCG/HR Place 1 patch (12.5 mcg total) onto the skin every 3 (three) days. 10 patch 0  . furosemide (LASIX) 40 MG tablet Take 40 mg by mouth 2 (two) times daily.     Marland Kitchen gabapentin (NEURONTIN) 100 MG capsule Take 100 mg by mouth every morning.     Marland Kitchen glipiZIDE (GLUCOTROL) 10 MG tablet Take 10 mg by mouth 2 (two) times daily before a meal.   0  . insulin lispro (HUMALOG) 100 UNIT/ML KiwkPen as per sliding scale< than 150-- No insulin150-200---4 units 201-250---6 units251-300--- 8 units301-350----10 units.351-400---12 units    . isosorbide mononitrate (IMDUR) 60 MG 24 hr tablet Take 60 mg by mouth daily.     Marland Kitchen levothyroxine (SYNTHROID, LEVOTHROID) 25 MCG tablet Take 25 mcg by mouth daily before breakfast.     . lidocaine-prilocaine (EMLA) cream Apply 1 application topically as needed. 30 g 3  . Liraglutide 18 MG/3ML SOPN Inject 3 mLs into the skin daily.     Marland Kitchen losartan (COZAAR) 100 MG tablet Take 100 mg by mouth daily.     . magic mouthwash w/lidocaine SOLN Take 5 mLs by mouth 4 (four) times daily as needed for mouth pain. 480 mL 3  . Magnesium-Calcium-Folic Acid 742-595-6 MG TABS Take 1 tablet by mouth daily.     . metFORMIN (GLUCOPHAGE) 1000 MG tablet Take 1,000 mg by mouth 2 (two) times daily.     . metoprolol-hydrochlorothiazide (LOPRESSOR HCT) 50-25 MG per  tablet Take 1 tablet by mouth daily.     . metroNIDAZOLE (FLAGYL) 500 MG tablet Take 1 tablet (500 mg total) by mouth 3 (three) times daily. 21 tablet 0  . oxyCODONE (OXY IR/ROXICODONE) 5 MG immediate release tablet Take 1 tablet (5 mg total) by mouth every 4 (four) hours as needed for severe pain. 60 tablet 0  . oxycodone (ROXICODONE) 30 MG immediate release tablet Take 1 tablet (30 mg total) by mouth every 6 (six) hours as needed for severe pain. 20 tablet 0  . potassium chloride (K-DUR) 10 MEQ tablet Take 2 tablets (20 mEq total) by mouth daily. 60 tablet 0  . promethazine (PHENERGAN) 25 MG tablet TAKE ONE TABLET BY MOUTH EVERY SIX HOURSAS NEEDED FOR NAUSEA OR VOMITING 30 tablet 0  . RABEprazole (ACIPHEX) 20 MG tablet Take 20 mg by mouth daily.     Marland Kitchen senna-docusate (SENNA S) 8.6-50 MG tablet Take 1 tablet by mouth 2 (two) times daily. 60 tablet 3  . simvastatin (ZOCOR) 20 MG tablet Take 20 mg by mouth every evening.     . nitroGLYCERIN (NITROSTAT) 0.4 MG SL tablet Place 0.4 mg under the tongue every 5 (five) minutes as needed for chest pain.     Marland Kitchen ondansetron (ZOFRAN) 4 MG tablet Take 1 tablet (4 mg total) by mouth every 6 (six) hours as needed. 30 tablet 3   No current facility-administered medications for this visit.   Facility-Administered Medications Ordered in Other Visits  Medication Dose Route Frequency Provider Last Rate Last Dose  . heparin lock flush 100 unit/mL  500 Units Intravenous Once Forest Gleason, MD   500 Units at 11/20/14 1116  . sodium chloride 0.9 % injection 10 mL  10 mL Intravenous PRN Forest Gleason, MD   10 mL at 11/20/14 0916  . sodium chloride 0.9 % injection  10 mL  10 mL Intravenous PRN Forest Gleason, MD   10 mL at 01/21/15 0915    OBJECTIVE:  Filed Vitals:   06/04/15 0938  BP: 124/83  Pulse: 65  Temp: 96.6 F (35.9 C)  Resp: 18     Body mass index is 24.96 kg/(m^2).    ECOG FS:1 - Symptomatic but completely ambulatory  PHYSICAL EXAM: General  status:  Performance status is good.  Patient said clinical signs of dehydration  Skin turgor is poor.  Mucous membranes are dry. HEENT: No evidence of stomatitis. Sclera and conjunctivae :: No jaundice.   pale looking. Lungs: Air  entry equal on both sides.  No rhonchi.  No rales.  Cardiac: Heart sounds are normal.  No pericardial rub.  No murmur. There is some swelling above port.  Soft.  No fluid.  No redness or tenderness. Lymphatic system: Cervical, axillary, inguinal, lymph nodes not palpable GI: Abdomen is soft.  No ascites.  Liver liver is palpable.  Tenderness in left upper quadrant   No tenderness.  Bowel sounds are within normal limit Lower extremity: No edema Neurological system: Higher functions, cranial nerves intact no evidence of peripheral neuropathy.  Examination of both breasts.  Patient had down previous history of carcinoma of left breast no palpable masses axillary lymph nodes are normal.  Right breast free of masses. December 27th, 2016 IMPRESSION: 1. Interval growth of the primary right upper lobe neoplasm. 2. Interval growth of right hilar, right paratracheal mediastinal and upper retroperitoneal lymphadenopathy. 3. Increased size and number of bilateral pulmonary metastases. 4. Increased size and number of liver metastases. 5. New bilateral adrenal metastases. 6. Trace right pleural effusion is decreased. 7. Moderate hiatal hernia.  All lab data has been reviewed  MEDICAL DECISION MAKING:  Small cell undifferentiated carcinoma of lung stage IV.  CT scan has been reviewed independently and reviewed with the patient.  There is progressive disease.  Taxotere and gemcitabine will be discontinued.  We discussed possibility of hospice and palliative care with pain control management Patient desires to continue chemotherapy certainly melena map has been started.  Palliative care has been discussed.  Patient has [place] issues with her husband having dementia.  We have our  social worker to contact her Ecchymosis in the right side of the face is resolving   Patient was explained all the side effects of chemotherapy.  And informed consent has been obtained All the side effects of chemotherapy including myelosuppression, alopecia, nausea vomiting fatigue weakness.  Secondary infection, and   peripheral neuropathy .  Has been discussed in details. Informal consent has been obtained and will be documented by nurses in the chartIntent of chemotherapy is palliation and relief in symptoms and extending survival  Forest Gleason, MD   06/05/2015 8:19 AM

## 2015-06-06 NOTE — Progress Notes (Unsigned)
PSN spoke with family friend, per patient's request, Reginia Forts.  PSN answered questions and offered advise about placement of husband, and needs of the patient.

## 2015-06-14 ENCOUNTER — Telehealth: Payer: Self-pay | Admitting: *Deleted

## 2015-06-14 MED ORDER — FENTANYL 25 MCG/HR TD PT72: 25.0000 ug | MEDICATED_PATCH | TRANSDERMAL | Status: DC

## 2015-06-14 NOTE — Telephone Encounter (Signed)
Having more pain and would to know if her pain medication can be increased. Pt currently on fentanyl 28mg and oxycodone '5mg'$  q6hrs PRN.

## 2015-06-14 NOTE — Telephone Encounter (Signed)
Per leslie, pt can increase pain patch to 65mg. Instructed pt to put 2 patches of 126m on at this time and a new prescription for 2565mwill be given to patient. Instructed pt's brother, AndJonni Sangerhat when pt puts on 75m21match that she only needs to put one patch on at that time. Pt's brother verbalized understanding.

## 2015-06-17 ENCOUNTER — Other Ambulatory Visit: Payer: Self-pay | Admitting: *Deleted

## 2015-06-17 MED ORDER — OXYCODONE HCL 5 MG PO TABS: 5.0000 mg | ORAL_TABLET | ORAL | Status: DC | PRN

## 2015-06-18 ENCOUNTER — Inpatient Hospital Stay: Payer: Medicare Other | Attending: Oncology

## 2015-06-18 ENCOUNTER — Inpatient Hospital Stay (HOSPITAL_BASED_OUTPATIENT_CLINIC_OR_DEPARTMENT_OTHER): Payer: Medicare Other | Admitting: Oncology

## 2015-06-18 ENCOUNTER — Telehealth: Payer: Self-pay | Admitting: Pharmacist

## 2015-06-18 ENCOUNTER — Inpatient Hospital Stay: Payer: Medicare Other

## 2015-06-18 ENCOUNTER — Encounter: Payer: Self-pay | Admitting: Oncology

## 2015-06-18 VITALS — BP 108/67 | HR 65 | Resp 18 | Wt 135.6 lb

## 2015-06-18 DIAGNOSIS — M1990 Unspecified osteoarthritis, unspecified site: Secondary | ICD-10-CM | POA: Insufficient documentation

## 2015-06-18 DIAGNOSIS — Z79899 Other long term (current) drug therapy: Secondary | ICD-10-CM | POA: Diagnosis not present

## 2015-06-18 DIAGNOSIS — R5383 Other fatigue: Secondary | ICD-10-CM | POA: Insufficient documentation

## 2015-06-18 DIAGNOSIS — J449 Chronic obstructive pulmonary disease, unspecified: Secondary | ICD-10-CM | POA: Insufficient documentation

## 2015-06-18 DIAGNOSIS — M545 Low back pain: Secondary | ICD-10-CM

## 2015-06-18 DIAGNOSIS — K219 Gastro-esophageal reflux disease without esophagitis: Secondary | ICD-10-CM | POA: Diagnosis not present

## 2015-06-18 DIAGNOSIS — Z9221 Personal history of antineoplastic chemotherapy: Secondary | ICD-10-CM

## 2015-06-18 DIAGNOSIS — C3411 Malignant neoplasm of upper lobe, right bronchus or lung: Secondary | ICD-10-CM

## 2015-06-18 DIAGNOSIS — C7802 Secondary malignant neoplasm of left lung: Secondary | ICD-10-CM | POA: Diagnosis not present

## 2015-06-18 DIAGNOSIS — Z7982 Long term (current) use of aspirin: Secondary | ICD-10-CM | POA: Diagnosis not present

## 2015-06-18 DIAGNOSIS — Z8711 Personal history of peptic ulcer disease: Secondary | ICD-10-CM | POA: Insufficient documentation

## 2015-06-18 DIAGNOSIS — R059 Cough, unspecified: Secondary | ICD-10-CM

## 2015-06-18 DIAGNOSIS — E119 Type 2 diabetes mellitus without complications: Secondary | ICD-10-CM | POA: Diagnosis not present

## 2015-06-18 DIAGNOSIS — I10 Essential (primary) hypertension: Secondary | ICD-10-CM

## 2015-06-18 DIAGNOSIS — Z794 Long term (current) use of insulin: Secondary | ICD-10-CM | POA: Insufficient documentation

## 2015-06-18 DIAGNOSIS — C3490 Malignant neoplasm of unspecified part of unspecified bronchus or lung: Secondary | ICD-10-CM

## 2015-06-18 DIAGNOSIS — G8929 Other chronic pain: Secondary | ICD-10-CM | POA: Diagnosis not present

## 2015-06-18 DIAGNOSIS — K449 Diaphragmatic hernia without obstruction or gangrene: Secondary | ICD-10-CM | POA: Diagnosis not present

## 2015-06-18 DIAGNOSIS — C7971 Secondary malignant neoplasm of right adrenal gland: Secondary | ICD-10-CM

## 2015-06-18 DIAGNOSIS — M129 Arthropathy, unspecified: Secondary | ICD-10-CM | POA: Diagnosis not present

## 2015-06-18 DIAGNOSIS — C787 Secondary malignant neoplasm of liver and intrahepatic bile duct: Secondary | ICD-10-CM

## 2015-06-18 DIAGNOSIS — Z853 Personal history of malignant neoplasm of breast: Secondary | ICD-10-CM | POA: Insufficient documentation

## 2015-06-18 DIAGNOSIS — I509 Heart failure, unspecified: Secondary | ICD-10-CM | POA: Insufficient documentation

## 2015-06-18 DIAGNOSIS — I4891 Unspecified atrial fibrillation: Secondary | ICD-10-CM | POA: Insufficient documentation

## 2015-06-18 DIAGNOSIS — F329 Major depressive disorder, single episode, unspecified: Secondary | ICD-10-CM

## 2015-06-18 DIAGNOSIS — C7801 Secondary malignant neoplasm of right lung: Secondary | ICD-10-CM | POA: Insufficient documentation

## 2015-06-18 DIAGNOSIS — C7972 Secondary malignant neoplasm of left adrenal gland: Secondary | ICD-10-CM | POA: Diagnosis not present

## 2015-06-18 DIAGNOSIS — Z8542 Personal history of malignant neoplasm of other parts of uterus: Secondary | ICD-10-CM | POA: Diagnosis not present

## 2015-06-18 DIAGNOSIS — R531 Weakness: Secondary | ICD-10-CM | POA: Diagnosis not present

## 2015-06-18 DIAGNOSIS — H9319 Tinnitus, unspecified ear: Secondary | ICD-10-CM

## 2015-06-18 DIAGNOSIS — Z87891 Personal history of nicotine dependence: Secondary | ICD-10-CM | POA: Insufficient documentation

## 2015-06-18 DIAGNOSIS — R05 Cough: Secondary | ICD-10-CM

## 2015-06-18 DIAGNOSIS — I2510 Atherosclerotic heart disease of native coronary artery without angina pectoris: Secondary | ICD-10-CM | POA: Insufficient documentation

## 2015-06-18 DIAGNOSIS — R1011 Right upper quadrant pain: Secondary | ICD-10-CM

## 2015-06-18 LAB — COMPREHENSIVE METABOLIC PANEL
ALBUMIN: 3.1 g/dL — AB (ref 3.5–5.0)
ALK PHOS: 422 U/L — AB (ref 38–126)
ALT: 26 U/L (ref 14–54)
ANION GAP: 7 (ref 5–15)
AST: 50 U/L — AB (ref 15–41)
BILIRUBIN TOTAL: 1 mg/dL (ref 0.3–1.2)
BUN: 22 mg/dL — ABNORMAL HIGH (ref 6–20)
CALCIUM: 8.4 mg/dL — AB (ref 8.9–10.3)
CO2: 24 mmol/L (ref 22–32)
Chloride: 97 mmol/L — ABNORMAL LOW (ref 101–111)
Creatinine, Ser: 0.82 mg/dL (ref 0.44–1.00)
GLUCOSE: 310 mg/dL — AB (ref 65–99)
Potassium: 3.7 mmol/L (ref 3.5–5.1)
Sodium: 128 mmol/L — ABNORMAL LOW (ref 135–145)
TOTAL PROTEIN: 6.2 g/dL — AB (ref 6.5–8.1)

## 2015-06-18 LAB — CBC WITH DIFFERENTIAL/PLATELET
BASOS PCT: 1 %
Basophils Absolute: 0.1 10*3/uL (ref 0–0.1)
Eosinophils Absolute: 0.1 10*3/uL (ref 0–0.7)
Eosinophils Relative: 1 %
HEMATOCRIT: 34.2 % — AB (ref 35.0–47.0)
HEMOGLOBIN: 11.2 g/dL — AB (ref 12.0–16.0)
LYMPHS PCT: 10 %
Lymphs Abs: 0.8 10*3/uL — ABNORMAL LOW (ref 1.0–3.6)
MCH: 28.9 pg (ref 26.0–34.0)
MCHC: 32.6 g/dL (ref 32.0–36.0)
MCV: 88.8 fL (ref 80.0–100.0)
MONOS PCT: 5 %
Monocytes Absolute: 0.5 10*3/uL (ref 0.2–0.9)
NEUTROS ABS: 7.1 10*3/uL — AB (ref 1.4–6.5)
NEUTROS PCT: 83 %
Platelets: 239 10*3/uL (ref 150–440)
RBC: 3.85 MIL/uL (ref 3.80–5.20)
RDW: 18.2 % — ABNORMAL HIGH (ref 11.5–14.5)
WBC: 8.5 10*3/uL (ref 3.6–11.0)

## 2015-06-18 LAB — MAGNESIUM: MAGNESIUM: 1.9 mg/dL (ref 1.7–2.4)

## 2015-06-18 MED ORDER — KETOROLAC TROMETHAMINE 30 MG/ML IJ SOLN
30.0000 mg | Freq: Once | INTRAMUSCULAR | Status: AC
  Administered 2015-06-18: 30 mg via INTRAVENOUS
  Filled 2015-06-18: qty 1

## 2015-06-18 MED ORDER — SODIUM CHLORIDE 0.9 % IV SOLN
10.0000 mg | Freq: Once | INTRAVENOUS | Status: AC
  Administered 2015-06-18: 10 mg via INTRAVENOUS
  Filled 2015-06-18: qty 1

## 2015-06-18 MED ORDER — SODIUM CHLORIDE 0.9 % IV SOLN
INTRAVENOUS | Status: DC
  Administered 2015-06-18: 11:00:00 via INTRAVENOUS
  Filled 2015-06-18: qty 1000

## 2015-06-18 MED ORDER — OXYCODONE HCL 10 MG PO TABS: 10.0000 mg | ORAL_TABLET | Freq: Four times a day (QID) | ORAL | Status: DC | PRN

## 2015-06-18 MED ORDER — SODIUM CHLORIDE 0.9% FLUSH
10.0000 mL | INTRAVENOUS | Status: DC | PRN
  Administered 2015-06-18: 10 mL via INTRAVENOUS
  Filled 2015-06-18: qty 10

## 2015-06-18 MED ORDER — HEPARIN SOD (PORK) LOCK FLUSH 100 UNIT/ML IV SOLN
500.0000 [IU] | Freq: Once | INTRAVENOUS | Status: AC
  Administered 2015-06-18: 500 [IU] via INTRAVENOUS
  Filled 2015-06-18: qty 5

## 2015-06-18 MED ORDER — FENTANYL 12 MCG/HR TD PT72: 12.5000 ug | MEDICATED_PATCH | TRANSDERMAL | Status: DC

## 2015-06-18 NOTE — Progress Notes (Signed)
Patient c/o having fallen twice in the past week.  States she is coughing up blood.  Sputum is pink.  States she is in a lot of pain this morning but has not taken any medication.  Wants to know if she can get something here when she goes back for treatment?  Further states that her pain is uncontrolled and wants to know if her oxycodone can be increased.  She is taking 2 at a time to cover pain. Patient is becoming more confused.  Also wants to speak to MD regarding Hospice - if she needs Hospice at this time or just supportive care in the home?

## 2015-06-18 NOTE — Progress Notes (Signed)
[place] @ [facility] Telephone:([phone]) [phone]  [phone]     Wendy Giles OB: 1932-12-12  MR#: [id number]  [id number]#:[id number]  Patient Care Team: Tracie Harrier, MD as PCP - General (Internal Medicine) Isaias Cowman, MD as Consulting Physician (Cardiology)  CHIEF COMPLAINT:  Chief Complaint  Patient presents with  . Lung Cancer       Carcinoma of breast                                AJCC Staging:  [id number]                               Stage Grouping: I                               Cancer Status: Evidence of disease.                               Estrogen receptor positive and                               progesterone receptor positive. HER- 2                               Negative. 2.  Abnormal CT scan of the chest (June, 2016) Oncology History   1.  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  Previous history of carcinoma breast stage I disease 3.  Started on chemotherapy with carboplatinum and VP-16 from November 20, 2014 MRI scan of brain was negative (July, 2016)   Patient was started on carboplatinum and VP-16 which has been discontinued in September of 2016 (received total of 3 cycles of chemotherapy) CT scan did not see any significant response 4, patient will be started on carboplatinum and CPT-11 from January 28, 2015  5.  Patient had severe diarrhea so carboplatinum CPT-11 has been discontinued patient has been started on Taxotere and gemcitabine combination from November of 2016 6.  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  Had a CT scan done patient is here for ongoing evaluation and treatment consideration Poor appetite. June 04, 2015 Patient fell and had injury to the right side of the face.  Patient fell in dental office.  Did not pass out did not have any seizure.  Slipped on the carpet. And tinnitus to right upper quadrant pain.  Patient is here to initiate chemotherapy with   Nivolulamab.  Patient's condition is somewhat declining.   Wendy Kelly, RN Registered Nurse Sign at close encounter  Service date: 06/18/2015 9:54 AM    Expand All Collapse All    Patient c/o having fallen twice in the  past week. States she is coughing up blood. Sputum is pink. States she is in a lot of pain this morning but has not taken any medication. Wants to know if she can get something here when she goes back for treatment? Further states that her pain is uncontrolled and wants to know if her oxycodone can be increased. She is taking 2 at a time to cover pain. Patient is becoming more confused. Also wants to speak to MD regarding Hospice - if she needs Hospice at this time or just supportive care in the home    Since condition has been declining.  Family is present.  Increasing pain even after increasing fentanyl patch to 25 g.  Patient is falling frequently at home.  Patient has very poor social structure at home REVIEW OF SYSTEMS:    general status: Patient is feeling weak and tired.  No change in a performance status.  No chills.  No fever. HEENT: Alopecia.  No evidence of stomatitis Chest: Dry hacking cough no chills or fever Lungs:  shortness of breath and exertion Had thoracentesis and fluid was positive for small cell undifferentiated carcinoma of lung Cardiac: No chest pain or paroxysmal nocturnal dyspnea GI: Diarrhea has improved Significant pain in the right upper quadrant Skin: Poor skin turgor Lower  extremity welling 1+ Neurological system: No tingling.  No numbness.  No other focal signs Musculoskeletal system no bony pains  Genitourinary system: No dysuria.  No hematuria.  No frequency.  No flank pain. Neurological system: No dizziness.  No tingling.  His was.  no tingling numbness.  no focal weakness or any focal signs.   PAST MEDICAL HISTORY: Past Medical History  Diagnosis Date  . CHF (congestive heart failure) ([place])   . Diabetes mellitus without complication ([place])   . Hypertension   . Presence of permanent cardiac pacemaker   . Dysrhythmia     afib  . Depression   . GERD (gastroesophageal reflux disease)   . Coronary artery disease   . Cancer ([place])     breast,uterine  . COPD (chronic obstructive pulmonary disease) ([employer])   . Arthritis   . Anemia   . Anginal pain ([place])   . Cancer of upper lobe of right lung ([place]) 11/03/2014  . Breast cancer [facility])       Surgical History            Diabetes controlled with oral medication                               Previous history of congestive heart                               failure                               Coronary artery disease with stent                               placement and bypass surgery Hypertension                               Osteoarthritis  Colon polyps                               Chronic low back pain                                                              Past Surgical History:                               Coronary bypass surgery and stent                               placement                                Pacemaker because of atrial fibrillation                               Appendectomy                               Hemorrhoidectomy                                                              Family History:                               Family history of breast cancer and colon                               cancer. History of anemia, diabetes, heart                                disease, and hypertension in the family.                                                              Social History:                               Does not smoke now. Does not drink. Used                               to smoke in the pa ADVANCED DIRECTIVES:  No flowsheet data found.  HEALTH MAINTENANCE: Social History  Substance Use Topics  . Smoking status: Former Smoker    Quit date:  10/21/1997  . Smokeless tobacco: Never Used  . Alcohol Use: No      Allergies  Allergen Reactions  . Amoxicillin-Pot Clavulanate Hives and Diarrhea  . Fentanyl Itching  . Codeine Nausea And Vomiting and Other (See Comments)    Other Reaction: "very ill"  . Latex Rash  . Other Rash    Current Outpatient Prescriptions  Medication Sig Dispense Refill  . albuterol (PROAIR HFA) 108 (90 BASE) MCG/ACT inhaler Inhale into the lungs.    . ALPRAZolam (XANAX) 0.5 MG tablet Take 0.5 mg by mouth once a week.     Marland Kitchen aspirin 81 MG tablet Take 81 mg by mouth daily.     Marland Kitchen bismuth subsalicylate (KAOPECTATE) 262 MG/15ML suspension Take 30 mLs by mouth 4 (four) times daily. 360 mL 0  . cyclobenzaprine (FLEXERIL) 5 MG tablet Take 5 mg by mouth daily as needed for muscle spasms.     . dabigatran (PRADAXA) 150 MG CAPS capsule TAKE ONE CAPSULE BY MOUTH TWICE DAILY    . dicyclomine (BENTYL) 10 MG capsule Take 10 mg by mouth 3 (three) times daily as needed (abdominal pain).     Marland Kitchen diphenoxylate-atropine (LOMOTIL) 2.5-0.025 MG tablet Take 1 tablet by mouth 4 (four) times daily as needed for diarrhea or loose stools. 30 tablet 0  . fentaNYL (DURAGESIC - DOSED MCG/HR) 25 MCG/HR patch Place 1 patch (25 mcg total) onto the skin every 3 (three) days. 10 patch 0  . furosemide (LASIX) 40 MG tablet Take 40 mg by mouth 2 (two) times daily.     Marland Kitchen gabapentin (NEURONTIN) 100 MG capsule Take 100 mg by mouth every morning.     Marland Kitchen glipiZIDE (GLUCOTROL) 10 MG tablet Take 10 mg by mouth 2 (two) times daily before a  meal.   0  . insulin lispro (HUMALOG) 100 UNIT/ML KiwkPen as per sliding scale< than 150-- No insulin150-200---4 units 201-250---6 units251-300--- 8 units301-350----10 units.351-400---12 units    . isosorbide mononitrate (IMDUR) 60 MG 24 hr tablet Take 60 mg by mouth daily.     Marland Kitchen levothyroxine (SYNTHROID, LEVOTHROID) 25 MCG tablet Take 25 mcg by mouth daily before breakfast.     . lidocaine-prilocaine (EMLA) cream Apply 1 application topically as needed. 30 g 3  . Liraglutide 18 MG/3ML SOPN Inject 3 mLs into the skin daily.     Marland Kitchen losartan (COZAAR) 100 MG tablet Take 100 mg by mouth daily.     . magic mouthwash w/lidocaine SOLN Take 5 mLs by mouth 4 (four) times daily as needed for mouth pain. 480 mL 3  . Magnesium-Calcium-Folic Acid 254-982-6 MG TABS Take 1 tablet by mouth daily.     . metFORMIN (GLUCOPHAGE) 1000 MG tablet Take 1,000 mg by mouth 2 (two) times daily.     . metoprolol-hydrochlorothiazide (LOPRESSOR HCT) 50-25 MG per tablet Take 1 tablet by mouth daily.     . metroNIDAZOLE (FLAGYL) 500 MG tablet Take 1 tablet (500 mg total) by mouth 3 (three) times daily. 21 tablet 0  . ondansetron (ZOFRAN) 4 MG tablet Take 1 tablet (4 mg total) by mouth every 6 (six) hours as needed. 30 tablet 3  . potassium chloride (K-DUR) 10 MEQ tablet Take 2 tablets (20 mEq total) by mouth daily. 60 tablet 0  . promethazine (PHENERGAN) 25 MG tablet TAKE ONE TABLET BY MOUTH EVERY SIX HOURSAS NEEDED FOR NAUSEA OR VOMITING 30 tablet 0  . RABEprazole (ACIPHEX) 20 MG tablet Take 20 mg by mouth daily.     Marland Kitchen  senna-docusate (SENNA S) 8.6-50 MG tablet Take 1 tablet by mouth 2 (two) times daily. 60 tablet 3  . simvastatin (ZOCOR) 20 MG tablet Take 20 mg by mouth every evening.     . nitroGLYCERIN (NITROSTAT) 0.4 MG SL tablet Place 0.4 mg under the tongue every 5 (five) minutes as needed for chest pain.      No current facility-administered medications for this visit.   Facility-Administered Medications Ordered in Other  Visits  Medication Dose Route Frequency Provider Last Rate Last Dose  . heparin lock flush 100 unit/mL  500 Units Intravenous Once Forest Gleason, MD   500 Units at 11/20/14 1116  . sodium chloride 0.9 % injection 10 mL  10 mL Intravenous PRN Forest Gleason, MD   10 mL at 11/20/14 0916  . sodium chloride 0.9 % injection 10 mL  10 mL Intravenous PRN Forest Gleason, MD   10 mL at 01/21/15 0915    OBJECTIVE:  Filed Vitals:   06/18/15 0952  BP: 108/67  Pulse: 65  Resp: 18     Body mass index is 24.02 kg/(m^2).    ECOG FS:1 - Symptomatic but completely ambulatory  PHYSICAL EXAM: General  status: Performance status is good.  Patient said clinical signs of dehydration  Skin turgor is poor.  Mucous membranes are dry. HEENT: No evidence of stomatitis. Sclera and conjunctivae :: No jaundice.   pale looking. Lungs: Air  entry equal on both sides.  No rhonchi.  No rales.  Cardiac: Heart sounds are normal.  No pericardial rub.  No murmur. There is some swelling above port.  Soft.  No fluid.  No redness or tenderness. Lymphatic system: Cervical, axillary, inguinal, lymph nodes not palpable GI: Abdomen is soft.  No ascites.  Liver liver is palpable.  Tenderness in left upper quadrant   No tenderness.  Bowel sounds are within normal limit Lower extremity: No edema Neurological system: Higher functions, cranial nerves intact no evidence of peripheral neuropathy.  Examination of both breasts.  Patient had down previous history of carcinoma of left breast no palpable masses axillary lymph nodes are normal.  Right breast free of masses. December 27th, 2016 IMPRESSION: 1. Interval growth of the primary right upper lobe neoplasm. 2. Interval growth of right hilar, right paratracheal mediastinal and upper retroperitoneal lymphadenopathy. 3. Increased size and number of bilateral pulmonary metastases. 4. Increased size and number of liver metastases. 5. New bilateral adrenal metastases. 6. Trace right  pleural effusion is decreased. 7. Moderate hiatal hernia.  All lab data has been reviewed  MEDICAL DECISION MAKING:  Small cell undifferentiated carcinoma of lung stage IV.  CT scan has been reviewed independently and reviewed with the patient.  There is progressive disease.   Patient's condition has declined rapidly.  Increasing pain in the right upper quadrant. Patient's brother and niece is here.  Patient has a very poor social structure.  Husband has dementia and his condition has been declining. I had prolonged discussion with family and patient will be discussed Prognosis which is very limited. Controlling pain is very important.  Will give intravenous Toradol and steroid today we discontinue all chemotherapy as I do not expect a significant advantage of continuing treatment To control pain immediately as patient is in mild distress will give Toradol 30 mg intravenously and Decadron 10 mg intravenously And discuss possibility of hospice and palliative care patient and family is agreeable Increase fentanyl patch to 25 and 12 g total 37 g Increase oxycodone breakthrough pain to 10  mg Along with the hospice will gradually increase fentanyl patch to 50 g E there is no significant relief Considering overall patient's poor social situation patient may have to be transferred to hospice home for palliative care and control of pain and care of end-of-life Patient wanted to discuss overall prognosis Total duration of visit was 45 minutes.  50% or more time was spent in counseling patient and family regarding prognosis and options of treatment and available resources    Forest Gleason, MD   06/18/2015 10:34 AM

## 2015-06-18 NOTE — Telephone Encounter (Signed)
Metronidazole was listed on home medication list. Wendy Giles confirmed that she is not taking this and removed it from medication list since there is a reaction with Toradol. Md confirmed the '30mg'$  dose of Toradol. Patient needs pain control and is at end of life.

## 2015-06-18 NOTE — Addendum Note (Signed)
Addended by: Telford Nab on: 06/18/2015 10:58 AM   Modules accepted: Orders, Medications

## 2015-06-21 ENCOUNTER — Telehealth: Payer: Self-pay | Admitting: *Deleted

## 2015-06-21 MED ORDER — FENTANYL 25 MCG/HR TD PT72: 50.0000 ug | MEDICATED_PATCH | TRANSDERMAL | Status: DC

## 2015-06-21 NOTE — Telephone Encounter (Signed)
Current Fentanyl dose is 37 mcg, She is still having to use Oxycodone every 4 hours. Her path is due to be changed today and asking if her fentanyl can be increased to 50 mcg. Per L Herring, AGNP OK to increase patch to 50 mcg, Otila Kluver informed and will notify patietn of dose change

## 2015-06-22 ENCOUNTER — Inpatient Hospital Stay
Admission: EM | Admit: 2015-06-22 | Discharge: 2015-06-26 | DRG: 871 | Disposition: A | Discharge status: Home / Self Care | Attending: Specialist | Admitting: Specialist

## 2015-06-22 ENCOUNTER — Emergency Department

## 2015-06-22 ENCOUNTER — Encounter: Payer: Self-pay | Admitting: Emergency Medicine

## 2015-06-22 DIAGNOSIS — C787 Secondary malignant neoplasm of liver and intrahepatic bile duct: Secondary | ICD-10-CM | POA: Diagnosis present

## 2015-06-22 DIAGNOSIS — Z6823 Body mass index (BMI) 23.0-23.9, adult: Secondary | ICD-10-CM

## 2015-06-22 DIAGNOSIS — G9341 Metabolic encephalopathy: Secondary | ICD-10-CM | POA: Diagnosis present

## 2015-06-22 DIAGNOSIS — E876 Hypokalemia: Secondary | ICD-10-CM | POA: Diagnosis present

## 2015-06-22 DIAGNOSIS — Z66 Do not resuscitate: Secondary | ICD-10-CM | POA: Diagnosis present

## 2015-06-22 DIAGNOSIS — A419 Sepsis, unspecified organism: Secondary | ICD-10-CM | POA: Diagnosis not present

## 2015-06-22 DIAGNOSIS — T68XXXA Hypothermia, initial encounter: Secondary | ICD-10-CM | POA: Diagnosis present

## 2015-06-22 DIAGNOSIS — I4891 Unspecified atrial fibrillation: Secondary | ICD-10-CM | POA: Diagnosis present

## 2015-06-22 DIAGNOSIS — Z794 Long term (current) use of insulin: Secondary | ICD-10-CM

## 2015-06-22 DIAGNOSIS — C3410 Malignant neoplasm of upper lobe, unspecified bronchus or lung: Secondary | ICD-10-CM | POA: Diagnosis present

## 2015-06-22 DIAGNOSIS — I10 Essential (primary) hypertension: Secondary | ICD-10-CM | POA: Diagnosis present

## 2015-06-22 DIAGNOSIS — Z515 Encounter for palliative care: Secondary | ICD-10-CM | POA: Diagnosis not present

## 2015-06-22 DIAGNOSIS — K219 Gastro-esophageal reflux disease without esophagitis: Secondary | ICD-10-CM | POA: Diagnosis present

## 2015-06-22 DIAGNOSIS — C3411 Malignant neoplasm of upper lobe, right bronchus or lung: Secondary | ICD-10-CM | POA: Diagnosis not present

## 2015-06-22 DIAGNOSIS — J690 Pneumonitis due to inhalation of food and vomit: Secondary | ICD-10-CM | POA: Diagnosis present

## 2015-06-22 DIAGNOSIS — J440 Chronic obstructive pulmonary disease with acute lower respiratory infection: Secondary | ICD-10-CM | POA: Diagnosis present

## 2015-06-22 DIAGNOSIS — Z950 Presence of cardiac pacemaker: Secondary | ICD-10-CM | POA: Diagnosis not present

## 2015-06-22 DIAGNOSIS — E119 Type 2 diabetes mellitus without complications: Secondary | ICD-10-CM

## 2015-06-22 DIAGNOSIS — E11649 Type 2 diabetes mellitus with hypoglycemia without coma: Secondary | ICD-10-CM | POA: Diagnosis present

## 2015-06-22 DIAGNOSIS — I2510 Atherosclerotic heart disease of native coronary artery without angina pectoris: Secondary | ICD-10-CM | POA: Diagnosis present

## 2015-06-22 DIAGNOSIS — E118 Type 2 diabetes mellitus with unspecified complications: Secondary | ICD-10-CM

## 2015-06-22 DIAGNOSIS — E43 Unspecified severe protein-calorie malnutrition: Secondary | ICD-10-CM | POA: Diagnosis present

## 2015-06-22 DIAGNOSIS — Z951 Presence of aortocoronary bypass graft: Secondary | ICD-10-CM | POA: Diagnosis not present

## 2015-06-22 DIAGNOSIS — J189 Pneumonia, unspecified organism: Secondary | ICD-10-CM | POA: Diagnosis not present

## 2015-06-22 DIAGNOSIS — E162 Hypoglycemia, unspecified: Secondary | ICD-10-CM

## 2015-06-22 LAB — CBC WITH DIFFERENTIAL/PLATELET
Basophils Absolute: 0 10*3/uL (ref 0–0.1)
Basophils Relative: 0 %
EOS PCT: 1 %
Eosinophils Absolute: 0.1 10*3/uL (ref 0–0.7)
HEMATOCRIT: 36.3 % (ref 35.0–47.0)
Hemoglobin: 11.5 g/dL — ABNORMAL LOW (ref 12.0–16.0)
LYMPHS ABS: 1 10*3/uL (ref 1.0–3.6)
LYMPHS PCT: 9 %
MCH: 28.3 pg (ref 26.0–34.0)
MCHC: 31.8 g/dL — AB (ref 32.0–36.0)
MCV: 89.2 fL (ref 80.0–100.0)
MONO ABS: 0.8 10*3/uL (ref 0.2–0.9)
MONOS PCT: 6 %
NEUTROS ABS: 10 10*3/uL — AB (ref 1.4–6.5)
Neutrophils Relative %: 84 %
PLATELETS: 278 10*3/uL (ref 150–440)
RBC: 4.07 MIL/uL (ref 3.80–5.20)
RDW: 18.5 % — AB (ref 11.5–14.5)
WBC: 11.9 10*3/uL — ABNORMAL HIGH (ref 3.6–11.0)

## 2015-06-22 LAB — GASTROINTESTINAL PANEL BY PCR, STOOL (REPLACES STOOL CULTURE)
ADENOVIRUS F40/41: NOT DETECTED
ASTROVIRUS: NOT DETECTED
CYCLOSPORA CAYETANENSIS: NOT DETECTED
Campylobacter species: NOT DETECTED
Cryptosporidium: NOT DETECTED
E. coli O157: NOT DETECTED
ENTAMOEBA HISTOLYTICA: NOT DETECTED
ENTEROAGGREGATIVE E COLI (EAEC): NOT DETECTED
ENTEROPATHOGENIC E COLI (EPEC): NOT DETECTED
ENTEROTOXIGENIC E COLI (ETEC): NOT DETECTED
GIARDIA LAMBLIA: NOT DETECTED
NOROVIRUS GI/GII: NOT DETECTED
Plesimonas shigelloides: NOT DETECTED
Rotavirus A: NOT DETECTED
SAPOVIRUS (I, II, IV, AND V): NOT DETECTED
Salmonella species: NOT DETECTED
Shiga like toxin producing E coli (STEC): NOT DETECTED
Shigella/Enteroinvasive E coli (EIEC): NOT DETECTED
VIBRIO CHOLERAE: NOT DETECTED
VIBRIO SPECIES: NOT DETECTED
Yersinia enterocolitica: NOT DETECTED

## 2015-06-22 LAB — BLOOD GAS, ARTERIAL
ALLENS TEST (PASS/FAIL): POSITIVE — AB
Acid-Base Excess: 4.2 mmol/L — ABNORMAL HIGH (ref 0.0–3.0)
Bicarbonate: 26.9 mEq/L (ref 21.0–28.0)
FIO2: 0.36
O2 SAT: 98.5 %
PATIENT TEMPERATURE: 37
pCO2 arterial: 33 mmHg (ref 32.0–48.0)
pH, Arterial: 7.52 — ABNORMAL HIGH (ref 7.350–7.450)
pO2, Arterial: 102 mmHg (ref 83.0–108.0)

## 2015-06-22 LAB — COMPREHENSIVE METABOLIC PANEL
ALT: 36 U/L (ref 14–54)
AST: 81 U/L — AB (ref 15–41)
Albumin: 3.2 g/dL — ABNORMAL LOW (ref 3.5–5.0)
Alkaline Phosphatase: 359 U/L — ABNORMAL HIGH (ref 38–126)
Anion gap: 11 (ref 5–15)
BILIRUBIN TOTAL: 0.8 mg/dL (ref 0.3–1.2)
BUN: 26 mg/dL — AB (ref 6–20)
CHLORIDE: 102 mmol/L (ref 101–111)
CO2: 26 mmol/L (ref 22–32)
Calcium: 8.9 mg/dL (ref 8.9–10.3)
Creatinine, Ser: 0.75 mg/dL (ref 0.44–1.00)
Glucose, Bld: 53 mg/dL — ABNORMAL LOW (ref 65–99)
Potassium: 2.8 mmol/L — CL (ref 3.5–5.1)
Sodium: 139 mmol/L (ref 135–145)
TOTAL PROTEIN: 6.8 g/dL (ref 6.5–8.1)

## 2015-06-22 LAB — URINALYSIS COMPLETE WITH MICROSCOPIC (ARMC ONLY)
BILIRUBIN URINE: NEGATIVE
Bacteria, UA: NONE SEEN
HGB URINE DIPSTICK: NEGATIVE
KETONES UR: NEGATIVE mg/dL
LEUKOCYTES UA: NEGATIVE
Nitrite: NEGATIVE
Protein, ur: NEGATIVE mg/dL
Specific Gravity, Urine: 1.011 (ref 1.005–1.030)
WBC, UA: NONE SEEN WBC/hpf (ref 0–5)
pH: 7 (ref 5.0–8.0)

## 2015-06-22 LAB — CK: CK TOTAL: 147 U/L (ref 38–234)

## 2015-06-22 LAB — C DIFFICILE QUICK SCREEN W PCR REFLEX
C DIFFICILE (CDIFF) INTERP: NEGATIVE
C DIFFICILE (CDIFF) TOXIN: NEGATIVE
C DIFFICLE (CDIFF) ANTIGEN: NEGATIVE

## 2015-06-22 LAB — GLUCOSE, CAPILLARY
Glucose-Capillary: 59 mg/dL — ABNORMAL LOW (ref 65–99)
Glucose-Capillary: 169 mg/dL — ABNORMAL HIGH (ref 65–99)

## 2015-06-22 LAB — LACTIC ACID, PLASMA
LACTIC ACID, VENOUS: 1.4 mmol/L (ref 0.5–2.0)
Lactic Acid, Venous: 2.3 mmol/L (ref 0.5–2.0)

## 2015-06-22 LAB — TROPONIN I

## 2015-06-22 LAB — MAGNESIUM: MAGNESIUM: 1.9 mg/dL (ref 1.7–2.4)

## 2015-06-22 MED ORDER — POTASSIUM CL IN DEXTROSE 5% 20 MEQ/L IV SOLN
20.0000 meq | INTRAVENOUS | Status: DC
  Administered 2015-06-22: 20 meq via INTRAVENOUS
  Filled 2015-06-22 (×2): qty 1000

## 2015-06-22 MED ORDER — DEXTROSE 50 % IV SOLN
25.0000 g | Freq: Once | INTRAVENOUS | Status: AC
  Administered 2015-06-22: 25 g via INTRAVENOUS

## 2015-06-22 MED ORDER — OXYCODONE HCL 5 MG PO TABS
10.0000 mg | ORAL_TABLET | Freq: Four times a day (QID) | ORAL | Status: DC | PRN
  Administered 2015-06-23 – 2015-06-24 (×4): 10 mg via ORAL
  Filled 2015-06-22 (×4): qty 2

## 2015-06-22 MED ORDER — FENTANYL 50 MCG/HR TD PT72
50.0000 ug | MEDICATED_PATCH | TRANSDERMAL | Status: DC
  Administered 2015-06-22: 50 ug via TRANSDERMAL
  Filled 2015-06-22: qty 1

## 2015-06-22 MED ORDER — LEVOFLOXACIN IN D5W 750 MG/150ML IV SOLN
750.0000 mg | Freq: Once | INTRAVENOUS | Status: AC
  Administered 2015-06-22: 750 mg via INTRAVENOUS
  Filled 2015-06-22: qty 150

## 2015-06-22 MED ORDER — ACETAMINOPHEN 650 MG RE SUPP: 650.0000 mg | Freq: Four times a day (QID) | RECTAL | Status: DC | PRN

## 2015-06-22 MED ORDER — ACETAMINOPHEN 325 MG PO TABS: 650.0000 mg | ORAL_TABLET | Freq: Four times a day (QID) | ORAL | Status: DC | PRN

## 2015-06-22 MED ORDER — ALUM & MAG HYDROXIDE-SIMETH 200-200-20 MG/5ML PO SUSP
30.0000 mL | Freq: Four times a day (QID) | ORAL | Status: DC | PRN
  Administered 2015-06-22: 30 mL via ORAL
  Filled 2015-06-22: qty 30

## 2015-06-22 MED ORDER — ENOXAPARIN SODIUM 40 MG/0.4ML ~~LOC~~ SOLN
40.0000 mg | SUBCUTANEOUS | Status: DC
  Administered 2015-06-22 – 2015-06-23 (×2): 40 mg via SUBCUTANEOUS
  Filled 2015-06-22 (×2): qty 0.4

## 2015-06-22 MED ORDER — DEXTROSE 50 % IV SOLN
INTRAVENOUS | Status: AC
  Administered 2015-06-22: 25 g via INTRAVENOUS
  Filled 2015-06-22: qty 50

## 2015-06-22 MED ORDER — CETYLPYRIDINIUM CHLORIDE 0.05 % MT LIQD
7.0000 mL | Freq: Two times a day (BID) | OROMUCOSAL | Status: DC
  Administered 2015-06-22 – 2015-06-26 (×7): 7 mL via OROMUCOSAL

## 2015-06-22 MED ORDER — POTASSIUM CHLORIDE 10 MEQ/100ML IV SOLN
10.0000 meq | INTRAVENOUS | Status: AC
Start: 2015-06-22 — End: 2015-06-22
  Administered 2015-06-22 (×3): 10 meq via INTRAVENOUS
  Filled 2015-06-22 (×4): qty 100

## 2015-06-22 MED ORDER — PIPERACILLIN-TAZOBACTAM 3.375 G IVPB: 3.3750 g | Freq: Three times a day (TID) | INTRAVENOUS | Status: DC

## 2015-06-22 MED ORDER — ONDANSETRON HCL 4 MG PO TABS
4.0000 mg | ORAL_TABLET | Freq: Four times a day (QID) | ORAL | Status: DC | PRN
  Administered 2015-06-26: 11:00:00 4 mg via ORAL
  Filled 2015-06-22: qty 1

## 2015-06-22 MED ORDER — POTASSIUM CHLORIDE IN NACL 20-0.9 MEQ/L-% IV SOLN
INTRAVENOUS | Status: DC
  Administered 2015-06-22 – 2015-06-23 (×3): via INTRAVENOUS
  Filled 2015-06-22 (×4): qty 1000

## 2015-06-22 MED ORDER — ONDANSETRON HCL 4 MG/2ML IJ SOLN: 4.0000 mg | Freq: Four times a day (QID) | INTRAMUSCULAR | Status: DC | PRN

## 2015-06-22 MED ORDER — LEVOFLOXACIN IN D5W 750 MG/150ML IV SOLN
750.0000 mg | INTRAVENOUS | Status: DC
  Administered 2015-06-23 – 2015-06-24 (×2): 750 mg via INTRAVENOUS
  Filled 2015-06-22 (×2): qty 150

## 2015-06-22 MED ORDER — ALPRAZOLAM 0.25 MG PO TABS
0.2500 mg | ORAL_TABLET | Freq: Three times a day (TID) | ORAL | Status: DC | PRN
  Administered 2015-06-22 – 2015-06-26 (×3): 0.25 mg via ORAL
  Filled 2015-06-22 (×3): qty 1

## 2015-06-22 NOTE — ED Notes (Signed)
Per Dr. Verdell Carmine pt may have flexi-seal for severe diarrhea. No devices available in ED, call placed to obtain one. Pt and family are in agreement with plan.

## 2015-06-22 NOTE — ED Notes (Signed)
Patient transported to CT 

## 2015-06-22 NOTE — ED Notes (Signed)
Patient to ER via [facility] from home for c/o unresponsiveness/AMS. Patient is Hospice patient for lung and liver CA. Pfatient had on 2 25 mcg Fentanyl patches at time of EMS arrival. Patient had also gotten up at approx 0200 per family and taken Hydrocodone. Family then went in at approx 0545 and found patient laying in floor, unsure if patient fell or not. Patient's family moved patient back to bed. No signs of fall per EMS (blood, etc). Patient was verbally unresponsive upon EMS arrival to home. Fentanyl patches removed at the time and 2 mg Narcan given with no affect. Patient's blood sugar checked and reading low on meter. Patient given amp of D50. Respirations dropped to 5, patient given 2 more mg of Narcan and bagged at that time. Patient remains verbally unresponsive at this time. Upon arrival to ER, patient leaning towards right side.

## 2015-06-22 NOTE — ED Provider Notes (Signed)
[facility] Emergency Department Provider Note  ____________________________________________  Time seen: Approximately 7:03 AM  I have reviewed the triage vital signs and the nursing notes.   HISTORY  Chief Complaint Altered Mental Status  Limited by unresponsiveness  HPI Barbee Mamula is a 82 y.o. female who presents to the ED from home via EMS for unresponsiveness. Patient has a history of metastatic lung cancer on home hospice. Family confirms patient has a DO NOT RESUSCITATE which is on her refrigerator. Her son came into town last night; he states she stayed up later than usual. She recently had an increase of her oxycodone and fentanyl patch by her oncologist. Son reports she "dialed up her insulin pen" and injected herself with it. Approximately 5 AM family found patient on the floor beside her bed moaning. They do not think she fell or struck her head. She did have a recent fall 3 weeks ago with old bruising on her neck and face. Family denies recent fever, chest pain, shortness of breath, vomiting or diarrhea. On EMS arrival, patient was unresponsive. She received several doses of Narcan with minimal improvement. Her blood sugar read "low" on their glucometer and patient was given an amp of D50 which brought her sugars up to 260s.   Past Medical History  Diagnosis Date  . CHF (congestive heart failure) ([place])   . Diabetes mellitus without complication ([place])   . Hypertension   . Presence of permanent cardiac pacemaker   . Dysrhythmia     afib  . Depression   . GERD (gastroesophageal reflux disease)   . Coronary artery disease   . Cancer ([place])     breast,uterine  . COPD (chronic obstructive pulmonary disease) ([place])   . Arthritis   . Anemia   . Anginal pain ([place])   . Cancer of upper lobe of right lung ([place]) 11/03/2014  . Breast cancer [facility])     Patient Active Problem List   Diagnosis Date Noted  . Cancer of upper lobe of right lung ([place])  11/03/2014  . History of biliary T-tube placement 10/29/2014  . Mass of lung   . Breath shortness 01/02/2014  . Artificial cardiac pacemaker 01/01/2014  . Diabetes ([place]) 01/01/2014  . H/O cardiac catheterization 01/01/2014  . History of cardioversion 01/01/2014  . S/P coronary artery balloon dilation 01/01/2014  . Paroxysmal atrial fibrillation ([place]) 01/01/2014  . Diabetes mellitus ([place]) 01/01/2014    Past Surgical History  Procedure Laterality Date  . Cholecystectomy    . Coronary angioplasty    . Coronary artery bypass graft    . Appendectomy    . Mastectomy    . Ablation of dysrhythmic focus    . Cardioversion    . Joint replacement      tkr  . Insert / replace / remove pacemaker    . Peripheral vascular catheterization N/A 11/05/2014    Procedure: Glori Luis Cath Insertion;  Surgeon: Algernon Huxley, MD;  Location: [facility] [place] CV LAB;  Service: Cardiovascular;  Laterality: N/A;  . Endobronchial ultrasound N/A 10/24/2014    Procedure: ENDOBRONCHIAL ULTRASOUND;  Surgeon: Vilinda Boehringer, MD;  Location: [facility] ORS;  Service: Cardiopulmonary;  Laterality: N/A;    Current Outpatient Rx  Name  Route  Sig  Dispense  Refill  . acetaminophen (TYLENOL) 325 MG tablet   Oral   Take 650 mg by mouth every 6 (six) hours as needed for moderate pain, fever or headache.         . fentaNYL (  DURAGESIC - DOSED MCG/HR) 25 MCG/HR patch   Transdermal   Place 2 patches (50 mcg total) onto the skin every 3 (three) days.   10 patch   0   . Oxycodone HCl 10 MG TABS   Oral   Take 1 tablet (10 mg total) by mouth every 6 (six) hours as needed.   60 tablet   0   . albuterol (PROAIR HFA) 108 (90 BASE) MCG/ACT inhaler   Inhalation   Inhale into the lungs.         . ALPRAZolam (XANAX) 0.5 MG tablet   Oral   Take 0.5 mg by mouth once a week.          Marland Kitchen aspirin 81 MG tablet   Oral   Take 81 mg by mouth daily.          . cyclobenzaprine (FLEXERIL) 5 MG tablet   Oral   Take 5 mg by mouth daily  as needed for muscle spasms.          . dabigatran (PRADAXA) 150 MG CAPS capsule      TAKE ONE CAPSULE BY MOUTH TWICE DAILY         . dicyclomine (BENTYL) 10 MG capsule   Oral   Take 10 mg by mouth 3 (three) times daily as needed (abdominal pain).          Marland Kitchen diphenoxylate-atropine (LOMOTIL) 2.5-0.025 MG tablet   Oral   Take 1 tablet by mouth 4 (four) times daily as needed for diarrhea or loose stools.   30 tablet   0   . furosemide (LASIX) 40 MG tablet   Oral   Take 40 mg by mouth 2 (two) times daily.          Marland Kitchen gabapentin (NEURONTIN) 100 MG capsule   Oral   Take 100 mg by mouth every morning.          Marland Kitchen glipiZIDE (GLUCOTROL) 10 MG tablet   Oral   Take 10 mg by mouth 2 (two) times daily before a meal.       0   . insulin lispro (HUMALOG) 100 UNIT/ML KiwkPen      as per sliding scale< than 150-- No insulin150-200---4 units 201-250---6 units251-300--- 8 units301-350----10 units.351-400---12 units         . isosorbide mononitrate (IMDUR) 60 MG 24 hr tablet   Oral   Take 60 mg by mouth daily.          Marland Kitchen levothyroxine (SYNTHROID, LEVOTHROID) 25 MCG tablet   Oral   Take 25 mcg by mouth daily before breakfast.          . lidocaine-prilocaine (EMLA) cream   Topical   Apply 1 application topically as needed.   30 g   3   . Liraglutide 18 MG/3ML SOPN   Subcutaneous   Inject 3 mLs into the skin daily.          Marland Kitchen losartan (COZAAR) 100 MG tablet   Oral   Take 100 mg by mouth daily.          . magic mouthwash w/lidocaine SOLN   Oral   Take 5 mLs by mouth 4 (four) times daily as needed for mouth pain.   480 mL   3   . Magnesium-Calcium-Folic Acid 703-500-9 MG TABS   Oral   Take 1 tablet by mouth daily.          . metFORMIN (GLUCOPHAGE) 1000 MG tablet   Oral  Take 1,000 mg by mouth 2 (two) times daily.          . metoprolol-hydrochlorothiazide (LOPRESSOR HCT) 50-25 MG per tablet   Oral   Take 1 tablet by mouth daily.          Marland Kitchen  EXPIRED: nitroGLYCERIN (NITROSTAT) 0.4 MG SL tablet   Sublingual   Place 0.4 mg under the tongue every 5 (five) minutes as needed for chest pain.          Marland Kitchen ondansetron (ZOFRAN) 4 MG tablet   Oral   Take 1 tablet (4 mg total) by mouth every 6 (six) hours as needed.   30 tablet   3   . potassium chloride (K-DUR) 10 MEQ tablet   Oral   Take 2 tablets (20 mEq total) by mouth daily.   60 tablet   0   . promethazine (PHENERGAN) 25 MG tablet      TAKE ONE TABLET BY MOUTH EVERY SIX HOURSAS NEEDED FOR NAUSEA OR VOMITING   30 tablet   0   . RABEprazole (ACIPHEX) 20 MG tablet   Oral   Take 20 mg by mouth daily.          Marland Kitchen senna-docusate (SENNA S) 8.6-50 MG tablet   Oral   Take 1 tablet by mouth 2 (two) times daily.   60 tablet   3   . simvastatin (ZOCOR) 20 MG tablet   Oral   Take 20 mg by mouth every evening.            Allergies Amoxicillin-pot clavulanate; Fentanyl; Codeine; Latex; and Other  No family history on file.  Social History Social History  Substance Use Topics  . Smoking status: Former Smoker    Quit date: 10/21/1997  . Smokeless tobacco: Never Used  . Alcohol Use: No    Review of Systems Constitutional: No fever/chills Eyes: No visual changes. ENT: No sore throat. Cardiovascular: Denies chest pain. Respiratory: Denies shortness of breath. Gastrointestinal: No abdominal pain.  No nausea, no vomiting.  No diarrhea.  No constipation. Genitourinary: Negative for dysuria. Musculoskeletal: Negative for back pain. Skin: Negative for rash. Neurological: Positive for unresponsiveness. Negative for headaches, focal weakness or numbness.  10-point ROS otherwise negative.  ____________________________________________   PHYSICAL EXAM:  VITAL SIGNS: ED Triage Vitals  Enc Vitals Group     BP 06/22/15 0623 179/87 mmHg     Pulse Rate 06/22/15 0623 65     Resp 06/22/15 0623 33     Temp 06/22/15 0623 93.7 F (34.3 C)     Temp Source 06/22/15 0623  Rectal     SpO2 06/22/15 0623 100 %     Weight 06/22/15 0623 147 lb 7.8 oz (66.9 kg)     Height 06/22/15 0623 '5\' 6"'$  (1.676 m)     Head Cir --      Peak Flow --      Pain Score --      Pain Loc --      Pain Edu? --      Excl. in [place]? --     Constitutional: Unresponsive. Occasionally opens eyes to voice.  Eyes: Conjunctivae are normal. PERRL. EOMI. Head: Atraumatic. Nose: No congestion/rhinnorhea. Mouth/Throat: Mucous membranes are moist.  Oropharynx non-erythematous. Neck: No stridor.  No step-offs or deformities. Cardiovascular: Normal rate, regular rhythm. Grossly normal heart sounds.  Good peripheral circulation. Respiratory: Diminished respiratory effort.  No retractions. Lungs diminished bilaterally. Gastrointestinal: Soft and nontender. No distention. No abdominal bruits. No CVA tenderness.  Musculoskeletal: No lower extremity tenderness nor edema.  No joint effusions. Neurologic:  Unresponsive; occasionally opens eyes to voice.  Skin:  Skin is warm, dry and intact. No rash noted. Specifically, no petechiae. Psychiatric: Unable to assess.  ____________________________________________   LABS (all labs ordered are listed, but only abnormal results are displayed)  Labs Reviewed  BLOOD GAS, ARTERIAL - Abnormal; Notable for the following:    pH, Arterial 7.52 (*)    Acid-Base Excess 4.2 (*)    Allens test (pass/fail) POSITIVE (*)    All other components within normal limits  CULTURE, BLOOD (ROUTINE X 2)  CULTURE, BLOOD (ROUTINE X 2)  CBC WITH DIFFERENTIAL/PLATELET  COMPREHENSIVE METABOLIC PANEL  TROPONIN I  URINALYSIS COMPLETEWITH MICROSCOPIC ([facility] ONLY)  LACTIC ACID, PLASMA  LACTIC ACID, PLASMA   ____________________________________________  EKG  ED ECG REPORT I, SUNG,JADE J, the attending physician, personally viewed and interpreted this ECG.   Date: 06/22/2015  EKG Time: 0621  Rate: 65  Rhythm: normal EKG, normal sinus rhythm  Axis: LAD  Intervals:left  bundle branch block  ST&T Change: Nonspecific  ____________________________________________  RADIOLOGY  Portable chest x-ray (viewed by me, interpreted per Dr. Radene Knee): Lungs mildly hypoexpanded. Patchy right-sided airspace opacification is concerning for pneumonia, though asymmetric interstitial edema could have a similar appearance. Vascular congestion and Cardiomegaly.  CT Head & Cervical Spine interpreted per Dr. Jasmine December: CT head: Stable atrophy with supratentorial small vessel disease. Prior lacunar infarct in the left globus pallidum, stable. No acute infarct. No mass, hemorrhage, or extra-axial fluid collection. Evidence of resolving right frontal scalp hematoma compared to prior study.  CT cervical spine: No fracture. Slight spondylolisthesis at C7-T1 is felt to be due to underlying spondylosis.  Extensive multilevel spondylosis/osteoarthritis. Moderate spinal stenosis at C4-5 is due to diffuse disc protrusion, asymmetric toward the left. This disc protrusion is calcified. Borderline stenosis is also present at C5-6.  Foci of air superiorly as described above. Question iatrogenic introduction of air into vessels causing this problem. A traumatic source for this air is not apparent on current examination.  There is calcification in both carotid arteries. ____________________________________________   PROCEDURES  Procedure(s) performed: None  Critical Care performed:   CRITICAL CARE Performed by: Paulette Blanch   Total critical care time: 30 minutes  Critical care time was exclusive of separately billable procedures and treating other patients.  Critical care was necessary to treat or prevent imminent or life-threatening deterioration.  Critical care was time spent personally by me on the following activities: development of treatment plan with patient and/or surrogate as well as nursing, discussions with consultants, evaluation of patient's response to  treatment, examination of patient, obtaining history from patient or surrogate, ordering and performing treatments and interventions, ordering and review of laboratory studies, ordering and review of radiographic studies, pulse oximetry and re-evaluation of patient's condition.  ____________________________________________   INITIAL IMPRESSION / ASSESSMENT AND PLAN / ED COURSE  Pertinent labs & imaging results that were available during my care of the patient were reviewed by me and considered in my medical decision making (see chart for details).  82 year old female with metastatic lung cancer who presents unresponsive. She is hypothermic and hypoglycemic. Cervical collar applied for questionable fall; will obtain screening lab work including lactate, CT head and C-spine.  ----------------------------------------- 7:14 AM on 06/22/2015 -----------------------------------------  Discussed case with hospitalist who will evaluate patient in the emergency department for admission. CT head and cervical spine negative for acute traumatic injuries. Laboratory & urinalysis results pending. IV antibiotic  ordered for CAP seen on CXR. Bair hugger applied for hypothermia.  ____________________________________________   FINAL CLINICAL IMPRESSION(S) / ED DIAGNOSES  Final diagnoses:  Hypothermia, initial encounter  Hypoglycemia  Community acquired pneumonia  Cancer of upper lobe of right lung ([place])  Type 2 diabetes mellitus with complication, with long-term current use of insulin (HCC)      Paulette Blanch, MD 06/22/15 [id number]

## 2015-06-22 NOTE — H&P (Signed)
[facility] NAME: Wendy Giles    MR#:  [id number]  DATE OF BIRTH:  April 15, 1933  DATE OF ADMISSION:  06/22/2015  PRIMARY CARE PHYSICIAN: Tracie Harrier, MD   REQUESTING/REFERRING PHYSICIAN: Dr. Lurline Hare  CHIEF COMPLAINT:   Chief Complaint  Patient presents with  . Altered Mental Status    HISTORY OF PRESENT ILLNESS:  Wendy Giles  is a 82 y.o. female with a known history of breast cancer, lung cancer with metastases to the liver, COPD, hypertension, diabetes, history of pacemaker, chronic anemia, osteoarthritis who presents to the hospital due to altered mental status. Patient herself is a very poor historian therefore most of the history obtained from the family at bedside and over the phone. Patient apparently saw her oncologist is a few days back due to her worsening pain from her malignancy and he increased her fentanyl dose. The oncologist also recommended hospice and palliative care follow-up as her malignancy was not treatable. Patient this morning was found by her family member collapsed on the floor near her bed and was not responsive. They called EMS and she was brought to the emergency room. Patient was noted to be hypothermic, hypotensive and also hypoglycemic. Patient was also noted to have increasing diarrhea while in the emergency room. She was given some Narcan without much improvement in her mental status. Hospitalist services were contacted further treatment and evaluation.  PAST MEDICAL HISTORY:   Past Medical History  Diagnosis Date  . CHF (congestive heart failure) ([place])   . Diabetes mellitus without complication ([place])   . Hypertension   . Presence of permanent cardiac pacemaker   . Dysrhythmia     afib  . Depression   . GERD (gastroesophageal reflux disease)   . Coronary artery disease   . Cancer ([place])     breast,uterine  . COPD (chronic obstructive pulmonary disease) ([place])   .  Arthritis   . Anemia   . Anginal pain ([place])   . Cancer of upper lobe of right lung ([place]) 11/03/2014  . Breast cancer ([place])     PAST SURGICAL HISTORY:   Past Surgical History  Procedure Laterality Date  . Cholecystectomy    . Coronary angioplasty    . Coronary artery bypass graft    . Appendectomy    . Mastectomy    . Ablation of dysrhythmic focus    . Cardioversion    . Joint replacement      tkr  . Insert / replace / remove pacemaker    . Peripheral vascular catheterization N/A 11/05/2014    Procedure: Glori Luis Cath Insertion;  Surgeon: Algernon Huxley, MD;  Location: [facility];  Service: Cardiovascular;  Laterality: N/A;  . Endobronchial ultrasound N/A 10/24/2014    Procedure: ENDOBRONCHIAL ULTRASOUND;  Surgeon: Vilinda Boehringer, MD;  Location: [facility];  Service: Cardiopulmonary;  Laterality: N/A;    SOCIAL HISTORY:   Social History  Substance Use Topics  . Smoking status: Former Smoker -- 1.00 packs/day for 18 years    Types: Cigarettes    Quit date: 10/21/1997  . Smokeless tobacco: Never Used  . Alcohol Use: No    FAMILY HISTORY:   Family History  Problem Relation Age of Onset  . CVA Mother   . Cancer Father     DRUG ALLERGIES:   Allergies  Allergen Reactions  . Amoxicillin-Pot Clavulanate Hives, Diarrhea and Other (See Comments)    Patient is unable to answer follow up  questions and family in unaware of allergies  . Fentanyl Itching  . Codeine Nausea And Vomiting and Other (See Comments)    Other Reaction: "very ill"  . Latex Rash  . Other Rash    REVIEW OF SYSTEMS:   Review of Systems  Unable to perform ROS: mental acuity    MEDICATIONS AT HOME:   Prior to Admission medications   Medication Sig Start Date End Date Taking? Authorizing Provider  acetaminophen (TYLENOL) 325 MG tablet Take 650 mg by mouth every 6 (six) hours as needed for moderate pain, fever or headache.   Yes Historical Provider, MD  fentaNYL (DURAGESIC - DOSED MCG/HR) 25  MCG/HR patch Place 2 patches (50 mcg total) onto the skin every 3 (three) days. 06/21/15  Yes Evlyn Kanner, NP  Oxycodone HCl 10 MG TABS Take 1 tablet (10 mg total) by mouth every 6 (six) hours as needed. 06/18/15  Yes Forest Gleason, MD  albuterol (PROAIR HFA) 108 (90 BASE) MCG/ACT inhaler Inhale into the lungs. 10/29/14 10/29/15  Historical Provider, MD  ALPRAZolam Duanne Moron) 0.5 MG tablet Take 0.5 mg by mouth once a week.  05/29/14   Historical Provider, MD  aspirin 81 MG tablet Take 81 mg by mouth daily.  12/02/09   Historical Provider, MD  cyclobenzaprine (FLEXERIL) 5 MG tablet Take 5 mg by mouth daily as needed for muscle spasms.     Historical Provider, MD  dabigatran (PRADAXA) 150 MG CAPS capsule TAKE ONE CAPSULE BY MOUTH TWICE DAILY 04/02/14   Historical Provider, MD  dicyclomine (BENTYL) 10 MG capsule Take 10 mg by mouth 3 (three) times daily as needed (abdominal pain).  10/09/13   Historical Provider, MD  diphenoxylate-atropine (LOMOTIL) 2.5-0.025 MG tablet Take 1 tablet by mouth 4 (four) times daily as needed for diarrhea or loose stools. 05/01/15   Forest Gleason, MD  furosemide (LASIX) 40 MG tablet Take 40 mg by mouth 2 (two) times daily.  10/09/14   Historical Provider, MD  gabapentin (NEURONTIN) 100 MG capsule Take 100 mg by mouth every morning.  08/15/14   Historical Provider, MD  glipiZIDE (GLUCOTROL) 10 MG tablet Take 10 mg by mouth 2 (two) times daily before a meal.  10/01/14   Historical Provider, MD  insulin lispro (HUMALOG) 100 UNIT/ML KiwkPen as per sliding scale< than 150-- No insulin150-200---4 units 201-250---6 units251-300--- 8 units301-350----10 units.351-400---12 units 12/13/14   Historical Provider, MD  isosorbide mononitrate (IMDUR) 60 MG 24 hr tablet Take 60 mg by mouth daily.  09/10/14   Historical Provider, MD  levothyroxine (SYNTHROID, LEVOTHROID) 25 MCG tablet Take 25 mcg by mouth daily before breakfast.  07/23/14   Historical Provider, MD  lidocaine-prilocaine (EMLA) cream Apply 1  application topically as needed. 03/27/15   Forest Gleason, MD  Liraglutide 18 MG/3ML SOPN Inject 3 mLs into the skin daily.  06/26/14   Historical Provider, MD  losartan (COZAAR) 100 MG tablet Take 100 mg by mouth daily.  08/06/14   Historical Provider, MD  magic mouthwash w/lidocaine SOLN Take 5 mLs by mouth 4 (four) times daily as needed for mouth pain. 01/04/15   Evlyn Kanner, NP  Magnesium-Calcium-Folic Acid 409-811-9 MG TABS Take 1 tablet by mouth daily.  12/02/09   Historical Provider, MD  metFORMIN (GLUCOPHAGE) 1000 MG tablet Take 1,000 mg by mouth 2 (two) times daily.  10/09/14   Historical Provider, MD  metoprolol-hydrochlorothiazide (LOPRESSOR HCT) 50-25 MG per tablet Take 1 tablet by mouth daily.  04/13/14   Historical Provider, MD  nitroGLYCERIN (NITROSTAT) 0.4 MG SL tablet Place 0.4 mg under the tongue every 5 (five) minutes as needed for chest pain.  05/29/14 05/29/15  Historical Provider, MD  ondansetron (ZOFRAN) 4 MG tablet Take 1 tablet (4 mg total) by mouth every 6 (six) hours as needed. 06/04/15   Forest Gleason, MD  potassium chloride (K-DUR) 10 MEQ tablet Take 2 tablets (20 mEq total) by mouth daily. 02/14/15   Evlyn Kanner, NP  promethazine (PHENERGAN) 25 MG tablet TAKE ONE TABLET BY MOUTH EVERY SIX HOURSAS NEEDED FOR NAUSEA OR VOMITING 01/21/15   Evlyn Kanner, NP  RABEprazole (ACIPHEX) 20 MG tablet Take 20 mg by mouth daily.     Historical Provider, MD  senna-docusate (SENNA S) 8.6-50 MG tablet Take 1 tablet by mouth 2 (two) times daily. 05/16/15   Forest Gleason, MD  simvastatin (ZOCOR) 20 MG tablet Take 20 mg by mouth every evening.  08/27/14   Historical Provider, MD      VITAL SIGNS:  Blood pressure 171/92, pulse 64, temperature 93.7 F (34.3 C), temperature source Rectal, resp. rate 44, height '5\' 6"'$  (1.676 m), weight 66.9 kg (147 lb 7.8 oz), SpO2 95 %.  PHYSICAL EXAMINATION:  Physical Exam  GENERAL:  82 y.o.-year-old cachectic patient lying in the bed  Lethargic/encephalopathic.  EYES: Pupils equal, round, reactive to light and accommodation. No scleral icterus. Extraocular muscles intact.  HEENT: Head atraumatic, normocephalic. Oropharynx and nasopharynx clear. No oropharyngeal erythema, moist oral mucosa  NECK:  Supple, no jugular venous distention. No thyroid enlargement, no tenderness.  LUNGS: Normal breath sounds bilaterally, no wheezing, rales, rhonchi. No use of accessory muscles of respiration.  CARDIOVASCULAR: S1, S2 RRR. 2/6 systolic ejection murmur at the left sternal border, No rubs, gallops, clicks.  ABDOMEN: Soft, nontender, nondistended. Bowel sounds present. No organomegaly or mass.  EXTREMITIES: No pedal edema, cyanosis, or clubbing. + 2 pedal & radial pulses b/l.   NEUROLOGIC: Cranial nerves II through XII are intact. No focal Motor or sensory deficits appreciated b/l.  Globally weak PSYCHIATRIC: The patient is alert and oriented x 1. Lethargic/encephalopathic. SKIN: No obvious rash, lesion, or ulcer.   LABORATORY PANEL:   CBC  Recent Labs Lab 06/22/15 0622  WBC 11.9*  HGB 11.5*  HCT 36.3  PLT 278   ------------------------------------------------------------------------------------------------------------------  Chemistries   Recent Labs Lab 06/18/15 0921 06/22/15 0622  NA 128* 139  K 3.7 2.8*  CL 97* 102  CO2 24 26  GLUCOSE 310* 53*  BUN 22* 26*  CREATININE 0.82 0.75  CALCIUM 8.4* 8.9  MG 1.9  --   AST 50* 81*  ALT 26 36  ALKPHOS 422* 359*  BILITOT 1.0 0.8   ------------------------------------------------------------------------------------------------------------------  Cardiac Enzymes  Recent Labs Lab 06/22/15 0622  TROPONINI <0.03   ------------------------------------------------------------------------------------------------------------------  RADIOLOGY:  Ct Head Wo Contrast  06/22/2015  CLINICAL DATA:  Unwitnessed fall. Lung carcinoma. Patient unresponsive EXAM: CT HEAD WITHOUT  CONTRAST CT CERVICAL SPINE WITHOUT CONTRAST TECHNIQUE: Multidetector CT imaging of the head and cervical spine was performed following the standard protocol without intravenous contrast. Multiplanar CT image reconstructions of the cervical spine were also generated. COMPARISON:  Head CT May 23, 2015. FINDINGS: CT HEAD FINDINGS Mild diffuse atrophy is stable. There is no intracranial mass hemorrhage, extra-axial fluid collection, or midline shift. There is patchy small vessel disease throughout the centra semiovale bilaterally. There is a prior small lacunar infarct in the left globus pallidum. No new gray-white compartment lesions are identified. No acute infarct is evident.  The bony calvarium appears intact. The mastoid air cells are clear. Visualized intraorbital regions appear normal. There is a minimal residual right frontal scalp hematoma, much smaller compared to prior study. CT CERVICAL SPINE FINDINGS There is no demonstrable fracture. There is 3 mm of anterolisthesis of C7 on T1. No other spondylolisthesis is apparent. Prevertebral soft tissues and predental space regions are normal. There is moderately severe disc space narrowing at C3-4, C4-5, and C5-6. There is milder disc space narrowing at C6-7. There is facet hypertrophy at most levels bilaterally. There is exit foraminal narrowing at essentially all levels bilaterally due to diffuse bony hypertrophy. There is moderate generalized spinal stenosis at C4-5 due to diffuse calcified disc protrusion, asymmetric toward the left. There is borderline stenosis at C5-6 due to diffuse no bony hypertrophy. There are foci of air in the anterior thecal sac of the upper cervical spine as well as anterior to the superior aspect of the C2 vertebral body. A small focus of air is also noted immediately posterior to the upper odontoid. Air is seen in the superior right foramen transverse area in region. There is calcification in both carotid arteries. There is  scarring in each lung apex. IMPRESSION: CT head: Stable atrophy with supratentorial small vessel disease. Prior lacunar infarct in the left globus pallidum, stable. No acute infarct. No mass, hemorrhage, or extra-axial fluid collection. Evidence of resolving right frontal scalp hematoma compared to prior study. CT cervical spine: No fracture. Slight spondylolisthesis at C7-T1 is felt to be due to underlying spondylosis. Extensive multilevel spondylosis/osteoarthritis. Moderate spinal stenosis at C4-5 is due to diffuse disc protrusion, asymmetric toward the left. This disc protrusion is calcified. Borderline stenosis is also present at C5-6. Foci of air superiorly as described above. Question iatrogenic introduction of air into vessels causing this problem. A traumatic source for this air is not apparent on current examination. There is calcification in both carotid arteries. Electronically Signed   By: Lowella Grip III M.D.   On: 06/22/2015 07:14   Ct Cervical Spine Wo Contrast  06/22/2015  CLINICAL DATA:  Unwitnessed fall. Lung carcinoma. Patient unresponsive EXAM: CT HEAD WITHOUT CONTRAST CT CERVICAL SPINE WITHOUT CONTRAST TECHNIQUE: Multidetector CT imaging of the head and cervical spine was performed following the standard protocol without intravenous contrast. Multiplanar CT image reconstructions of the cervical spine were also generated. COMPARISON:  Head CT May 23, 2015. FINDINGS: CT HEAD FINDINGS Mild diffuse atrophy is stable. There is no intracranial mass hemorrhage, extra-axial fluid collection, or midline shift. There is patchy small vessel disease throughout the centra semiovale bilaterally. There is a prior small lacunar infarct in the left globus pallidum. No new gray-white compartment lesions are identified. No acute infarct is evident. The bony calvarium appears intact. The mastoid air cells are clear. Visualized intraorbital regions appear normal. There is a minimal residual right  frontal scalp hematoma, much smaller compared to prior study. CT CERVICAL SPINE FINDINGS There is no demonstrable fracture. There is 3 mm of anterolisthesis of C7 on T1. No other spondylolisthesis is apparent. Prevertebral soft tissues and predental space regions are normal. There is moderately severe disc space narrowing at C3-4, C4-5, and C5-6. There is milder disc space narrowing at C6-7. There is facet hypertrophy at most levels bilaterally. There is exit foraminal narrowing at essentially all levels bilaterally due to diffuse bony hypertrophy. There is moderate generalized spinal stenosis at C4-5 due to diffuse calcified disc protrusion, asymmetric toward the left. There is borderline stenosis at C5-6 due to  diffuse no bony hypertrophy. There are foci of air in the anterior thecal sac of the upper cervical spine as well as anterior to the superior aspect of the C2 vertebral body. A small focus of air is also noted immediately posterior to the upper odontoid. Air is seen in the superior right foramen transverse area in region. There is calcification in both carotid arteries. There is scarring in each lung apex. IMPRESSION: CT head: Stable atrophy with supratentorial small vessel disease. Prior lacunar infarct in the left globus pallidum, stable. No acute infarct. No mass, hemorrhage, or extra-axial fluid collection. Evidence of resolving right frontal scalp hematoma compared to prior study. CT cervical spine: No fracture. Slight spondylolisthesis at C7-T1 is felt to be due to underlying spondylosis. Extensive multilevel spondylosis/osteoarthritis. Moderate spinal stenosis at C4-5 is due to diffuse disc protrusion, asymmetric toward the left. This disc protrusion is calcified. Borderline stenosis is also present at C5-6. Foci of air superiorly as described above. Question iatrogenic introduction of air into vessels causing this problem. A traumatic source for this air is not apparent on current examination. There  is calcification in both carotid arteries. Electronically Signed   By: Lowella Grip III M.D.   On: 06/22/2015 07:14   Dg Chest Port 1 View  06/22/2015  CLINICAL DATA:  Acute onset of unresponsiveness.  Initial encounter. EXAM: PORTABLE CHEST 1 VIEW COMPARISON:  Chest radiograph performed 02/14/2015, and CT of the chest performed 05/07/2015 FINDINGS: The lungs are mildly hypoexpanded. Patchy right-sided airspace opacification is concerning for pneumonia, though asymmetric interstitial edema could have a similar appearance. Vascular congestion is noted. No pleural effusion or pneumothorax is identified. The cardiomediastinal silhouette is enlarged. The patient is status post median sternotomy, with evidence of prior CABG. A pacemaker is noted overlying the left chest wall, with leads ending overlying the right atrium and right ventricle. A right-sided chest port is noted ending about the distal SVC. No acute osseous abnormalities are seen. IMPRESSION: Lungs mildly hypoexpanded. Patchy right-sided airspace opacification is concerning for pneumonia, though asymmetric interstitial edema could have a similar appearance. Vascular congestion and cardiomegaly. Electronically Signed   By: Garald Balding M.D.   On: 06/22/2015 06:49     IMPRESSION AND PLAN:   82 year old female with past medical history of breast cancer, metastatic lung cancer, diabetes, hypertension, chronic pain, anemia, osteoarthritis who presents to the hospital due to altered mental status.  #1 altered mental status-this is metabolic encephalopathy due to underlying hypoglycemia and hypothermia from underlying sepsis. -Patient CT head on admission does not show any evidence of acute intracranial pathology. -Continue with the bear hugger, IV antibiotics for sepsis and fluids and follow mental status.  #2 hypoglycemia-due to underlying sepsis. Improving with some dextrose and will follow blood sugars.  #3 sepsis-patient presented with  hypothermia, tachypnea and the source was thought to be pneumonia. -Continue IV fluids, IV Zosyn and follow blood and sputum cultures.  #4 pneumonia-suspected aspiration. Patient was given Levaquin in the ER and I will start the patient on IV Zosyn and follow blood and sputum cultures. I will get a speech evaluation once patient's mental status is improved.  #5 hypokalemia-due to underlying diarrhea will supplement and repeat level. -Check magnesium level.  #6 diarrhea-etiology unclear. Check stool for comprehensive culture.  #7 diabetes type 2 without complication-patient is currently hypoglycemic. Hold all diabetic meds for now.  #8 history of breast cancer and active metastatic lung cancer-patient's prognosis is very poor. Patient was seen by her oncologist a few  days back and he recommended hospice services. -I will get a palliative care consult, and consider sending patient to hospice home upon discharge.   All the records are reviewed and case discussed with ED provider. Management plans discussed with the patient, family and they are in agreement.  CODE STATUS: DNR  TOTAL Critical Care TIME TAKING CARE OF THIS PATIENT: 50 minutes.    [name] M.D on 06/22/2015 at 8:51 AM  Between 7am to 6pm - Pager - [phone]  After 6pm go to [email or web address] - password [email or web address] [facility]  Office  [phone]  CC: Primary care physician; Tracie Harrier, MD

## 2015-06-22 NOTE — Consult Note (Signed)
Pharmacy Antibiotic Note  Wendy Giles is a 82 y.o. female admitted on 06/22/2015 with pneumonia.  Pharmacy has been consulted for Zosyn for aspiration pneumonia dosing.  Patient received levofloxacin '750mg'$  IV x1 in ED at 0930.    Patient has a listed allergy of hives with Augmentin. Nurse and MD are aware.   Plan: Will start patient on Zosyn 3.375 IV q8 hours EI.  Pharmacy will continue to monitor and make adjustments as needed.   Height: '5\' 6"'$  (167.6 cm) Weight: 147 lb 7.8 oz (66.9 kg) IBW/kg (Calculated) : 59.3  Temp (24hrs), Avg:94.4 F (34.7 C), Min:93.7 F (34.3 C), Max:95 F (35 C)   Recent Labs Lab 06/18/15 0921 06/22/15 0622 06/22/15 0734 06/22/15 1025  WBC 8.5 11.9*  --   --   CREATININE 0.82 0.75  --   --   LATICACIDVEN  --   --  2.3* 1.4    Estimated Creatinine Clearance: 52.5 mL/min (by C-G formula based on Cr of 0.75).    Allergies  Allergen Reactions  . Amoxicillin-Pot Clavulanate Hives, Diarrhea and Other (See Comments)    Patient is unable to answer follow up questions and family in unaware of allergies  . Fentanyl Itching  . Codeine Nausea And Vomiting and Other (See Comments)    Other Reaction: "very ill"  . Latex Rash  . Other Rash    Antimicrobials this admission: 02/11 >> Levofloxacin '750mg'$  X1 02/11 >> Zosyn   Dose adjustments this admission: N/A  Microbiology results: 02/11 BCx: pending    Thank you for allowing pharmacy to be a part of this patient's care.  Nancy Fetter, PharmD Pharmacy Resident  06/22/2015 12:28 PM

## 2015-06-23 LAB — CBC
HCT: 33.4 % — ABNORMAL LOW (ref 35.0–47.0)
HEMOGLOBIN: 10.8 g/dL — AB (ref 12.0–16.0)
MCH: 28.7 pg (ref 26.0–34.0)
MCHC: 32.5 g/dL (ref 32.0–36.0)
MCV: 88.2 fL (ref 80.0–100.0)
Platelets: 266 10*3/uL (ref 150–440)
RBC: 3.79 MIL/uL — ABNORMAL LOW (ref 3.80–5.20)
RDW: 18.2 % — ABNORMAL HIGH (ref 11.5–14.5)
WBC: 9.1 10*3/uL (ref 3.6–11.0)

## 2015-06-23 LAB — BASIC METABOLIC PANEL
ANION GAP: 4 — AB (ref 5–15)
BUN: 14 mg/dL (ref 6–20)
CALCIUM: 8.2 mg/dL — AB (ref 8.9–10.3)
CO2: 26 mmol/L (ref 22–32)
CREATININE: 0.77 mg/dL (ref 0.44–1.00)
Chloride: 106 mmol/L (ref 101–111)
GFR calc Af Amer: >60 mL/min (ref >60)
GFR calc non Af Amer: >60 mL/min (ref >60)
GLUCOSE: 197 mg/dL — AB (ref 65–99)
Potassium: 4.6 mmol/L (ref 3.5–5.1)
Sodium: 136 mmol/L (ref 135–145)

## 2015-06-23 MED ORDER — IBUPROFEN 400 MG PO TABS: 400.0000 mg | ORAL_TABLET | Freq: Four times a day (QID) | ORAL | Status: DC | PRN

## 2015-06-23 NOTE — Progress Notes (Signed)
Wendy Giles NAME: Wendy Giles    MR#:  [id number]  DATE OF BIRTH:  1932-09-17  SUBJECTIVE:   Patient is here due to altered mental status from sepsis and also noted to be hypothermic and hypokalemic. Much improved today and mental status is back to baseline. Family is at bedside. No acute complaints presently.  REVIEW OF SYSTEMS:    Review of Systems  Constitutional: Negative for fever and chills.  HENT: Negative for congestion and tinnitus.   Eyes: Negative for blurred vision and double vision.  Respiratory: Negative for cough, shortness of breath and wheezing.   Cardiovascular: Negative for chest pain, orthopnea and PND.  Gastrointestinal: Negative for nausea, vomiting, abdominal pain and diarrhea.  Genitourinary: Negative for dysuria and hematuria.  Neurological: Negative for dizziness, sensory change and focal weakness.  All other systems reviewed and are negative.   Nutrition: Clear liquid Tolerating Diet: yes  DRUG ALLERGIES:   Allergies  Allergen Reactions  . Amoxicillin-Pot Clavulanate Hives, Diarrhea and Other (See Comments)    Patient is unable to answer follow up questions and family in unaware of allergies  . Fentanyl Itching  . Codeine Nausea And Vomiting and Other (See Comments)    Other Reaction: "very ill"  . Latex Rash  . Other Rash    VITALS:  Blood pressure 133/55, pulse 65, temperature 97.7 F (36.5 C), temperature source Oral, resp. rate 18, height '5\' 6"'$  (1.676 m), weight 66.9 kg (147 lb 7.8 oz), SpO2 94 %.  PHYSICAL EXAMINATION:   Physical Exam  GENERAL:  82 y.o.-year-old patient lying in the bed in no acute distress.  EYES: Pupils equal, round, reactive to light and accommodation. No scleral icterus. Extraocular muscles intact.  HEENT: Head atraumatic, normocephalic. Oropharynx and nasopharynx clear.  NECK:  Supple, no jugular venous distention. No thyroid enlargement, no  tenderness.  LUNGS: Normal breath sounds bilaterally, no wheezing, rales, rhonchi. No use of accessory muscles of respiration.  CARDIOVASCULAR: S1, S2 RRR. No murmurs, rubs, or gallops.  Pacemaker in place.  Right chest wall Port in place.  ABDOMEN: Soft, nontender, nondistended. Bowel sounds present. No organomegaly or mass.  EXTREMITIES: No cyanosis, clubbing or edema b/l.    NEUROLOGIC: Cranial nerves II through XII are intact. No focal Motor or sensory deficits b/l.   PSYCHIATRIC: The patient is alert and oriented x 3. Good affect.   SKIN: No obvious rash, lesion, or ulcer.    LABORATORY PANEL:   CBC  Recent Labs Lab 06/23/15 0604  WBC 9.1  HGB 10.8*  HCT 33.4*  PLT 266   ------------------------------------------------------------------------------------------------------------------  Chemistries   Recent Labs Lab 06/22/15 0622 06/23/15 0604  NA 139 136  K 2.8* 4.6  CL 102 106  CO2 26 26  GLUCOSE 53* 197*  BUN 26* 14  CREATININE 0.75 0.77  CALCIUM 8.9 8.2*  MG 1.9  --   AST 81*  --   ALT 36  --   ALKPHOS 359*  --   BILITOT 0.8  --    ------------------------------------------------------------------------------------------------------------------  Cardiac Enzymes  Recent Labs Lab 06/22/15 0622  TROPONINI <0.03   ------------------------------------------------------------------------------------------------------------------  RADIOLOGY:  Ct Head Wo Contrast  06/22/2015  CLINICAL DATA:  Unwitnessed fall. Lung carcinoma. Patient unresponsive EXAM: CT HEAD WITHOUT CONTRAST CT CERVICAL SPINE WITHOUT CONTRAST TECHNIQUE: Multidetector CT imaging of the head and cervical spine was performed following the standard protocol without intravenous contrast. Multiplanar CT image reconstructions of the cervical spine were  also generated. COMPARISON:  Head CT May 23, 2015. FINDINGS: CT HEAD FINDINGS Mild diffuse atrophy is stable. There is no intracranial mass  hemorrhage, extra-axial fluid collection, or midline shift. There is patchy small vessel disease throughout the centra semiovale bilaterally. There is a prior small lacunar infarct in the left globus pallidum. No new gray-white compartment lesions are identified. No acute infarct is evident. The bony calvarium appears intact. The mastoid air cells are clear. Visualized intraorbital regions appear normal. There is a minimal residual right frontal scalp hematoma, much smaller compared to prior study. CT CERVICAL SPINE FINDINGS There is no demonstrable fracture. There is 3 mm of anterolisthesis of C7 on T1. No other spondylolisthesis is apparent. Prevertebral soft tissues and predental space regions are normal. There is moderately severe disc space narrowing at C3-4, C4-5, and C5-6. There is milder disc space narrowing at C6-7. There is facet hypertrophy at most levels bilaterally. There is exit foraminal narrowing at essentially all levels bilaterally due to diffuse bony hypertrophy. There is moderate generalized spinal stenosis at C4-5 due to diffuse calcified disc protrusion, asymmetric toward the left. There is borderline stenosis at C5-6 due to diffuse no bony hypertrophy. There are foci of air in the anterior thecal sac of the upper cervical spine as well as anterior to the superior aspect of the C2 vertebral body. A small focus of air is also noted immediately posterior to the upper odontoid. Air is seen in the superior right foramen transverse area in region. There is calcification in both carotid arteries. There is scarring in each lung apex. IMPRESSION: CT head: Stable atrophy with supratentorial small vessel disease. Prior lacunar infarct in the left globus pallidum, stable. No acute infarct. No mass, hemorrhage, or extra-axial fluid collection. Evidence of resolving right frontal scalp hematoma compared to prior study. CT cervical spine: No fracture. Slight spondylolisthesis at C7-T1 is felt to be due to  underlying spondylosis. Extensive multilevel spondylosis/osteoarthritis. Moderate spinal stenosis at C4-5 is due to diffuse disc protrusion, asymmetric toward the left. This disc protrusion is calcified. Borderline stenosis is also present at C5-6. Foci of air superiorly as described above. Question iatrogenic introduction of air into vessels causing this problem. A traumatic source for this air is not apparent on current examination. There is calcification in both carotid arteries. Electronically Signed   By: Lowella Grip III M.D.   On: 06/22/2015 07:14   Ct Cervical Spine Wo Contrast  06/22/2015  CLINICAL DATA:  Unwitnessed fall. Lung carcinoma. Patient unresponsive EXAM: CT HEAD WITHOUT CONTRAST CT CERVICAL SPINE WITHOUT CONTRAST TECHNIQUE: Multidetector CT imaging of the head and cervical spine was performed following the standard protocol without intravenous contrast. Multiplanar CT image reconstructions of the cervical spine were also generated. COMPARISON:  Head CT May 23, 2015. FINDINGS: CT HEAD FINDINGS Mild diffuse atrophy is stable. There is no intracranial mass hemorrhage, extra-axial fluid collection, or midline shift. There is patchy small vessel disease throughout the centra semiovale bilaterally. There is a prior small lacunar infarct in the left globus pallidum. No new gray-white compartment lesions are identified. No acute infarct is evident. The bony calvarium appears intact. The mastoid air cells are clear. Visualized intraorbital regions appear normal. There is a minimal residual right frontal scalp hematoma, much smaller compared to prior study. CT CERVICAL SPINE FINDINGS There is no demonstrable fracture. There is 3 mm of anterolisthesis of C7 on T1. No other spondylolisthesis is apparent. Prevertebral soft tissues and predental space regions are normal. There is moderately severe  disc space narrowing at C3-4, C4-5, and C5-6. There is milder disc space narrowing at C6-7. There is  facet hypertrophy at most levels bilaterally. There is exit foraminal narrowing at essentially all levels bilaterally due to diffuse bony hypertrophy. There is moderate generalized spinal stenosis at C4-5 due to diffuse calcified disc protrusion, asymmetric toward the left. There is borderline stenosis at C5-6 due to diffuse no bony hypertrophy. There are foci of air in the anterior thecal sac of the upper cervical spine as well as anterior to the superior aspect of the C2 vertebral body. A small focus of air is also noted immediately posterior to the upper odontoid. Air is seen in the superior right foramen transverse area in region. There is calcification in both carotid arteries. There is scarring in each lung apex. IMPRESSION: CT head: Stable atrophy with supratentorial small vessel disease. Prior lacunar infarct in the left globus pallidum, stable. No acute infarct. No mass, hemorrhage, or extra-axial fluid collection. Evidence of resolving right frontal scalp hematoma compared to prior study. CT cervical spine: No fracture. Slight spondylolisthesis at C7-T1 is felt to be due to underlying spondylosis. Extensive multilevel spondylosis/osteoarthritis. Moderate spinal stenosis at C4-5 is due to diffuse disc protrusion, asymmetric toward the left. This disc protrusion is calcified. Borderline stenosis is also present at C5-6. Foci of air superiorly as described above. Question iatrogenic introduction of air into vessels causing this problem. A traumatic source for this air is not apparent on current examination. There is calcification in both carotid arteries. Electronically Signed   By: Lowella Grip III M.D.   On: 06/22/2015 07:14   Dg Chest Port 1 View  06/22/2015  CLINICAL DATA:  Acute onset of unresponsiveness.  Initial encounter. EXAM: PORTABLE CHEST 1 VIEW COMPARISON:  Chest radiograph performed 02/14/2015, and CT of the chest performed 05/07/2015 FINDINGS: The lungs are mildly hypoexpanded. Patchy  right-sided airspace opacification is concerning for pneumonia, though asymmetric interstitial edema could have a similar appearance. Vascular congestion is noted. No pleural effusion or pneumothorax is identified. The cardiomediastinal silhouette is enlarged. The patient is status post median sternotomy, with evidence of prior CABG. A pacemaker is noted overlying the left chest wall, with leads ending overlying the right atrium and right ventricle. A right-sided chest port is noted ending about the distal SVC. No acute osseous abnormalities are seen. IMPRESSION: Lungs mildly hypoexpanded. Patchy right-sided airspace opacification is concerning for pneumonia, though asymmetric interstitial edema could have a similar appearance. Vascular congestion and cardiomegaly. Electronically Signed   By: Garald Balding M.D.   On: 06/22/2015 06:49     ASSESSMENT AND PLAN:   82 year old female with past medical history of breast cancer, metastatic lung cancer, diabetes, hypertension, chronic pain, anemia, osteoarthritis who presents to the hospital due to altered mental status.  #1 altered mental status-this is metabolic encephalopathy due to underlying hypoglycemia and hypothermia from underlying sepsis. -CT head (-).  Hypothermia resolved and hemodynamically stable and Mental status much improved and back to baseline.   #2 hypoglycemia-due to underlying sepsis.  - was on Dextrose gtt but now stopped and BS back to baseline.    #3 sepsis-patient presented with hypothermia, tachypnea and source is pneumonia.  - afebrile, hemodynamically stable now.  Cont. Levaquin.  Cultures so far (-).   #4 pneumonia-suspected aspiration.  - cont. Levaquin and clinically much improved.   #5 hypokalemia-improved and resolved with supplementation.  - Mg. Level is normal.   #6 diarrhea- resolved and Stool for Comp. Culture is (-).  -  tolerating regular diet now.  Will monitor and if needed consider PRN Imodium.   #7  diabetes type 2 without complication- was hypoglycemic but improved.  - will place on SSI as diet is improving.   #8 history of breast cancer and active metastatic lung cancer-patient's prognosis is very poor. Patient was seen by her oncologist a few days back and they recommended hospice services. -await Palliative care input and likely d/c to Hospice Home. Await SW input.      All the records are reviewed and case discussed with Care Management/Social Workerr. Management plans discussed with the patient, family and they are in agreement.  CODE STATUS: DNR  DVT Prophylaxis: Lovenox  TOTAL TIME TAKING CARE OF THIS PATIENT: 30 minutes.   POSSIBLE D/C IN 1-2 DAYS, DEPENDING ON CLINICAL CONDITION.   [name] M.D on 06/23/2015 at 12:17 PM  Between 7am to 6pm - Pager - [phone]  After 6pm go to [email or web address] - password [email or web address] [facility]  Office  [phone]  CC: Primary care physician; Tracie Harrier, MD

## 2015-06-23 NOTE — Clinical Social Work Note (Signed)
Clinical Social Work Assessment  Patient Details  Name: Wendy Giles MRN: [id number] Date of Birth: [birth year]  Date of referral:  06/23/15               Reason for consult:  End of Life/Hospice                Permission sought to share information with:  Facility Sport and exercise psychologist, Family Supports Permission granted to share information::  Yes, Verbal Permission Granted  Name::     son [name] [phone] and Meredith Pel [phone]  cell or [phone] hm  Agency::     Relationship::     Contact Information:     Housing/Transportation Living arrangements for the past 2 months:  [place] of Information:  Patient, Medical Team, Adult Children Patient Interpreter Needed:  None Criminal Activity/Legal Involvement Pertinent to Current Situation/Hospitalization:    Significant Relationships:  Adult Children, Spouse, Church Lives with:  Self Do you feel safe going back to the place where you live?    Need for family participation in patient care:     Care giving concerns:  None at this time   Facilities manager / plan:  MD spoke with CSW about talking to patient and family about Hospice Home.  States they are in agreement and needed additional information.    Son [name] says patient is able to sign herself into Hospice Home.  Patient and son in agreement with going to [facility].  Patient's minister at bedside with patient.  Per son [name]  [phone] he is leaving to return to [place] on Monday but can be reached any time. States patient's friend Rip Harbour will come tomorrow to meet with CSW to support patient in her transition.    CSW provided Hospice Home list.  Patient and family considering [facility], will make a decision on Monday.  Employment status:    Insurance informationEducational psychologist PT Recommendations:    Information / Referral to community resources:     Patient/Family's Response to care:  Patient and son was appreciative  of information provided by CSW.   Patient/Family's Understanding of and Emotional Response to Diagnosis, Current Treatment, and Prognosis:  Patient and son understands that patient is under continued medical work up at this time.   Emotional Assessment Appearance:  Appears stated age Attitude/Demeanor/Rapport:    Affect (typically observed):  Accepting, Adaptable, Appropriate, Calm, Pleasant Orientation:  Oriented to Self, Oriented to Situation, Oriented to Place Alcohol / Substance use:    Psych involvement (Current and /or in the community):  No (Comment)  Discharge Needs  Concerns to be addressed:  Care Coordination, Discharge Planning Concerns Readmission within the last 30 days:  No Current discharge risk:  Chronically ill, Dependent with Mobility Barriers to Discharge:  Continued Medical Work up   Maurine Cane, LCSW 06/23/2015, 4:36 PM

## 2015-06-24 DIAGNOSIS — E43 Unspecified severe protein-calorie malnutrition: Secondary | ICD-10-CM | POA: Insufficient documentation

## 2015-06-24 MED ORDER — OXYCODONE HCL 5 MG PO TABS
10.0000 mg | ORAL_TABLET | ORAL | Status: DC | PRN
  Administered 2015-06-24: 08:00:00 10 mg via ORAL
  Filled 2015-06-24: qty 2

## 2015-06-24 MED ORDER — FENTANYL 50 MCG/HR TD PT72
50.0000 ug | MEDICATED_PATCH | TRANSDERMAL | Status: DC
  Administered 2015-06-25: 16:00:00 50 ug via TRANSDERMAL
  Filled 2015-06-24: qty 1

## 2015-06-24 MED ORDER — MORPHINE SULFATE (CONCENTRATE) 10 MG/0.5ML PO SOLN
10.0000 mg | ORAL | Status: DC | PRN
  Administered 2015-06-25: 10 mg via ORAL
  Filled 2015-06-24: qty 1

## 2015-06-24 MED ORDER — DABIGATRAN ETEXILATE MESYLATE 150 MG PO CAPS
150.0000 mg | ORAL_CAPSULE | Freq: Two times a day (BID) | ORAL | Status: DC
  Administered 2015-06-24 – 2015-06-26 (×4): 150 mg via ORAL
  Filled 2015-06-24 (×6): qty 1

## 2015-06-24 MED ORDER — FENTANYL 75 MCG/HR TD PT72: 75.0000 ug | MEDICATED_PATCH | TRANSDERMAL | Status: DC

## 2015-06-24 MED ORDER — GABAPENTIN 100 MG PO CAPS
200.0000 mg | ORAL_CAPSULE | Freq: Every day | ORAL | Status: DC
  Administered 2015-06-24 – 2015-06-25 (×2): 200 mg via ORAL
  Filled 2015-06-24 (×3): qty 2

## 2015-06-24 MED ORDER — TUBERCULIN PPD 5 UNIT/0.1ML ID SOLN
5.0000 [IU] | Freq: Once | INTRADERMAL | Status: AC
  Administered 2015-06-24: 5 [IU] via INTRADERMAL
  Filled 2015-06-24: qty 0.1

## 2015-06-24 MED ORDER — GABAPENTIN 100 MG PO CAPS
100.0000 mg | ORAL_CAPSULE | ORAL | Status: DC
  Administered 2015-06-24 – 2015-06-26 (×4): 100 mg via ORAL
  Filled 2015-06-24 (×2): qty 1

## 2015-06-24 MED ORDER — LEVOFLOXACIN 750 MG PO TABS
750.0000 mg | ORAL_TABLET | Freq: Every day | ORAL | Status: DC
  Administered 2015-06-25 – 2015-06-26 (×2): 750 mg via ORAL
  Filled 2015-06-24 (×2): qty 1

## 2015-06-24 MED ORDER — OXYCODONE HCL 5 MG PO TABS
15.0000 mg | ORAL_TABLET | ORAL | Status: DC | PRN
  Administered 2015-06-24 – 2015-06-26 (×6): 15 mg via ORAL
  Filled 2015-06-24 (×5): qty 3

## 2015-06-24 MED ORDER — OXYCODONE HCL 5 MG PO TABS
15.0000 mg | ORAL_TABLET | ORAL | Status: DC | PRN
  Administered 2015-06-24: 14:00:00 15 mg via ORAL
  Filled 2015-06-24: qty 3

## 2015-06-24 MED ORDER — GLUCERNA SHAKE PO LIQD
237.0000 mL | Freq: Three times a day (TID) | ORAL | Status: DC
  Administered 2015-06-24 – 2015-06-26 (×7): 237 mL via ORAL

## 2015-06-24 MED ORDER — LEVOTHYROXINE SODIUM 50 MCG PO TABS
25.0000 ug | ORAL_TABLET | Freq: Every day | ORAL | Status: DC
Start: 2015-06-25 — End: 2015-06-26
  Administered 2015-06-25 – 2015-06-26 (×2): 25 ug via ORAL
  Filled 2015-06-24 (×2): qty 1

## 2015-06-24 MED ORDER — SENNOSIDES-DOCUSATE SODIUM 8.6-50 MG PO TABS
1.0000 | ORAL_TABLET | Freq: Two times a day (BID) | ORAL | Status: DC
  Administered 2015-06-24 – 2015-06-25 (×3): 1 via ORAL
  Filled 2015-06-24 (×3): qty 1

## 2015-06-24 NOTE — Progress Notes (Addendum)
Initial Nutrition Assessment  DOCUMENTATION CODES:   Severe malnutrition in context of chronic illness  INTERVENTION:   Meals and Snacks: Cater to patient preferences Medical Food Supplement Therapy: will recommend Glucerna Shake po TID, each supplement provides 220 kcal and 10 grams of protein   NUTRITION DIAGNOSIS:   Malnutrition related to chronic illness, cancer and cancer related treatments as evidenced by energy intake < or equal to 75% for > or equal to 1 month, percent weight loss.  GOAL:   Patient will meet greater than or equal to 90% of their needs  MONITOR:    (Energy Intake, Electrolyte and renal Profile, Anthropometrics, Digestive system, Glucose Profile)  REASON FOR ASSESSMENT:   Malnutrition Screening Tool    ASSESSMENT:   Pt admitted with AMS secondary to metabolic encephalopathy secondary to sepsis. Pt with h/o breast cancer with metastatic lung cancer.   Past Medical History  Diagnosis Date  . CHF (congestive heart failure) ([place])   . Diabetes mellitus without complication ([place])   . Hypertension   . Presence of permanent cardiac pacemaker   . Dysrhythmia     afib  . Depression   . GERD (gastroesophageal reflux disease)   . Coronary artery disease   . Cancer ([place])     breast,uterine  . COPD (chronic obstructive pulmonary disease) ([place])   . Arthritis   . Anemia   . Anginal pain ([place])   . Cancer of upper lobe of right lung ([place]) 11/03/2014  . Breast cancer ([place])     Diet Order:  Diet regular Room service appropriate?: Yes; Fluid consistency:: Thin    Current Nutrition: Pt reports eating a little bit this am at breakfast. Pt sipping on Pepsi no visit.   Food/Nutrition-Related History: Pt reports very poor appetite for months. Pt reports eating very small portions at best when able to eat. Pt reports trying to drink Glucerna when able, likes chocolate. RD notes pt was living at home PTA.   Scheduled Medications:  . antiseptic oral rinse  7 mL  Mouth Rinse BID  . enoxaparin (LOVENOX) injection  40 mg Subcutaneous Q24H  . feeding supplement (GLUCERNA SHAKE)  237 mL Oral TID BM  . [START ON 06/25/2015] fentaNYL  50 mcg Transdermal Q72H  . levofloxacin (LEVAQUIN) IV  750 mg Intravenous Q24H  . tuberculin  5 Units Intradermal Once     Electrolyte/Renal Profile and Glucose Profile:   Recent Labs Lab 06/18/15 0921 06/22/15 0622 06/23/15 0604  NA 128* 139 136  K 3.7 2.8* 4.6  CL 97* 102 106  CO2 '24 26 26  '$ BUN 22* 26* 14  CREATININE 0.82 0.75 0.77  CALCIUM 8.4* 8.9 8.2*  MG 1.9 1.9  --   GLUCOSE 310* 53* 197*   Protein Profile:  Recent Labs Lab 06/18/15 0921 06/22/15 0622  ALBUMIN 3.1* 3.2*    Gastrointestinal Profile: per Nsg note, pt with severe diarrhea, flexi-seal in place. Last BM:  06/22/2015   Nutrition-Focused Physical Exam Findings:  Unable to complete Nutrition-Focused physical exam at this time.    Weight Change: Pt reports 'I lose weight every day.' Pt reports weight of 160lbs in the fall. Per [facility] weight encounters 11% weight loss in 3-4 months.    Height:   Ht Readings from Last 1 Encounters:  06/22/15 '5\' 6"'$  (1.676 m)    Weight:   Wt Readings from Last 1 Encounters:  06/22/15 147 lb 7.8 oz (66.9 kg)   Wt Readings from Last 10 Encounters:  06/22/15 147  lb 7.8 oz (66.9 kg)  06/18/15 135 lb 9 oz (61.491 kg)  06/04/15 140 lb 14 oz (63.9 kg)  05/23/15 146 lb (66.225 kg)  05/01/15 157 lb 10.1 oz (71.5 kg)  04/17/15 156 lb 4.9 oz (70.9 kg)  04/10/15 159 lb 13.3 oz (72.5 kg)  03/27/15 160 lb 4.4 oz (72.7 kg)  03/13/15 158 lb 8.2 oz (71.9 kg)  02/28/15 167 lb (75.751 kg)    BMI:  Body mass index is 23.82 kg/(m^2).  Estimated Nutritional Needs:   Kcal:  BEE: 1147kcals, TEE: (IF 1.1-1.3)(AF 1.3) 1640-1940kcals  Protein:  66-80g protein (1.0-1.2g/kg)  Fluid:  1650-1977m of fluid (25-317mkg)  EDUCATION NEEDS:   No education needs identified at this time   HICrescentRD, LDN Pager ([phone]/On-Call Pager ([phone]

## 2015-06-24 NOTE — Progress Notes (Signed)
Visit made. Patient is currently followed by Hospice and [facility] at her home with a Hospice diagnosis of Lung cancer. She is a DNR code. Patient was admitted to [facility] on 2/11 after being found at home unresponsive. She has been treated for altered mental status r/t hypoglycemia and sepsis, she has received IV dextrose and continues on IV Levaquin.  Blood cultures pending. Per discussion with hospice home care team and patient's friend Rip Harbour patient has been having more confusion lately and there is concern about her being able to manage her insulin and pain medication at home. She lives alone with friends checking in on her. Her husband is currently at [facility] and has dementia. Family met with CSW and attending  on 2/12 and were under the impression that patient could go to the hospice home. Writer met in the room with patient and friend Reginia Forts and explained that at this time patient does not meet hospice home criteria.  This information was also shared with hospital care team including attending physician Dr. Verdell Carmine, CSW [facility] and [place].  Patient was alert and oriented through out discussion, she is forgetful at times and does appear to understand that she is unsafe to return home alone. Writer had a long conversation with Rip Harbour, who in turn spoke with patient's brother [name] (currently in [place]) and her son [name] (in [place]) and they have agreed that the safest plan is for Mrs. Vancleve to be discharged to an assisted living facility with continued hospice services. CSW Judson Roch made aware. Patient's pain is currently being managed with a 50 mcg fentanyl patch and oxycodone 15 mg q 4 hours for break through. Oxycodone was increased from 10 mg per chart review. Palliative Care consult pending.. Will continue to follow through final discharge. Thank you. Flo Shanks RN, BSN, [place] [facility] [place], [facility] [phone] c

## 2015-06-24 NOTE — Clinical Documentation Improvement (Signed)
Hospitalist  Can the diagnosis of pneumonia be further specified?   Type of Pneumonia - CAP, HCAP, Bacterial Pneumonia, Aspiration Pneumonia, Gram Negative Pneumonia, Other Condition, Unable to Clinically Determine  Document causative organism if known  Document mechanism - Aspiration, Ventilator - Associated, Radiation Induced, Other (Specify)  Document any associated diagnoses/conditions such as Respiratory Failure, Sepsis, Underlying Lung Disease (Specify), Other (Specify)  Other condition  Clinically Undetermined  Please update your documentation within the medical record to reflect your response to this query. Thank you.  Supporting Information:  (As per notes)Pt arrived to ED unresponsive. Filed: 06/22/2015 12:41 PM  Note Time: 06/22/2015 12:28 PM  Status: Signed    Editor: Pernell Dupre, [facility] (Pharmacist)              Pharmacy Antibiotic Note  Wendy Giles is a 82 y.o. female admitted on 06/22/2015 with pneumonia. Pharmacy has been consulted for Zosyn for aspiration pneumonia dosing. Patient received levofloxacin '750mg'$  IV x1 in ED at 0930.   Please exercise your independent, professional judgment when responding. A specific answer is not anticipated or expected.  Thank You, Alessandra Grout, RN, BSN, CCDS,Clinical Documentation Specialist:  [phone]  [id number]=Cell [place]- Health Information Management

## 2015-06-24 NOTE — NC FL2 (Signed)
[place] LEVEL OF CARE SCREENING TOOL     IDENTIFICATION  Patient Name: Wendy Giles Birthdate: Sep 11, 1932 Sex: female Admission Date (Current Location): 06/22/2015  [facility] and [place] Number:  Engineering geologist and Address:  [facility], [address] [place], [place] [address]      Provider Number: [id number]  Attending Physician Name and Address:  [name], MD  Relative Name and Phone Number:       Current Level of Care: Hospital Recommended Level of Care: [place] Prior Approval Number:    Date Approved/Denied:   PASRR Number:    Discharge Plan: Domiciliary (Rest home)    Current Diagnoses: Patient Active Problem List   Diagnosis Date Noted  . Protein-calorie malnutrition, severe 06/24/2015  . Sepsis ([place]) 06/22/2015  . Cancer of upper lobe of right lung ([place]) 11/03/2014  . History of biliary T-tube placement 10/29/2014  . Mass of lung   . Breath shortness 01/02/2014  . Artificial cardiac pacemaker 01/01/2014  . Diabetes ([place]) 01/01/2014  . H/O cardiac catheterization 01/01/2014  . History of cardioversion 01/01/2014  . S/P coronary artery balloon dilation 01/01/2014  . Paroxysmal atrial fibrillation ([place]) 01/01/2014  . Diabetes mellitus ([place]) 01/01/2014    Orientation RESPIRATION BLADDER Height & Weight     Self, Time, Situation, Place  O2 (3L) Incontinent Weight: 147 lb 7.8 oz (66.9 kg) Height:  '5\' 6"'$  (167.6 cm)  BEHAVIORAL SYMPTOMS/MOOD NEUROLOGICAL BOWEL NUTRITION STATUS      Continent Diet  AMBULATORY STATUS COMMUNICATION OF NEEDS Skin   Supervision Verbally Normal                       Personal Care Assistance Level of Assistance  Bathing, Feeding, Dressing Bathing Assistance: Limited assistance Feeding assistance: Independent Dressing Assistance: Limited assistance     Functional Limitations Info  Sight, Hearing, Speech Sight Info: Adequate Hearing Info:  Adequate Speech Info: Adequate    SPECIAL CARE FACTORS FREQUENCY  PT (By licensed PT)     Through [facility]              Contractures Contractures Info: Not present    Additional Factors Info  Code Status, Allergies Code Status Info: DNR Allergies Info: AMOXICILLIN-POT CLAVULANATE, FENTANYL, CODEINE, LATEX, OTHER           Current Medications (06/24/2015):  This is the current hospital active medication list Current Facility-Administered Medications  Medication Dose Route Frequency Provider Last Rate Last Dose  . acetaminophen (TYLENOL) tablet 650 mg  650 mg Oral Q6H PRN [name], MD       Or  . acetaminophen (TYLENOL) suppository 650 mg  650 mg Rectal Q6H PRN [name], MD      . ALPRAZolam Duanne Moron) tablet 0.25 mg  0.25 mg Oral TID PRN [name], MD   0.25 mg at 06/23/15 0705  . alum & mag hydroxide-simeth (MAALOX/MYLANTA) 200-200-20 MG/5ML suspension 30 mL  30 mL Oral Q6H PRN [name], MD   30 mL at 06/22/15 1553  . antiseptic oral rinse (CPC / CETYLPYRIDINIUM CHLORIDE 0.05%) solution 7 mL  7 mL Mouth Rinse BID [name], MD   7 mL at 06/23/15 1929  . dabigatran (PRADAXA) capsule 150 mg  150 mg Oral Q12H [name], MD      . feeding supplement (GLUCERNA SHAKE) (GLUCERNA SHAKE) liquid 237 mL  237 mL Oral TID BM [name],  MD   237 mL at 06/24/15 1550  . [START ON 06/25/2015] fentaNYL (DURAGESIC - dosed mcg/hr) 50 mcg  50 mcg Transdermal Q72H Colleen Can, MD      . gabapentin (NEURONTIN) capsule 100 mg  100 mg Oral 2 times per day [name], MD   100 mg at 06/24/15 1547  . gabapentin (NEURONTIN) capsule 200 mg  200 mg Oral QHS [name], MD      . ibuprofen (ADVIL,MOTRIN) tablet 400 mg  400 mg Oral Q6H PRN Nicholes Mango, MD      . Derrill Memo ON 06/25/2015] levofloxacin (LEVAQUIN) tablet 750 mg  750 mg Oral QAC breakfast [name], MD      . Derrill Memo ON 06/25/2015] levothyroxine (SYNTHROID, LEVOTHROID) tablet 25 mcg   25 mcg Oral QAC breakfast [name], MD      . morphine CONCENTRATE 10 MG/0.5ML oral solution 10 mg  10 mg Oral Q2H PRN [name], MD      . ondansetron (ZOFRAN) tablet 4 mg  4 mg Oral Q6H PRN [name], MD       Or  . ondansetron (ZOFRAN) injection 4 mg  4 mg Intravenous Q6H PRN [name], MD      . oxyCODONE (Oxy IR/ROXICODONE) immediate release tablet 15 mg  15 mg Oral Q4H PRN [name], MD      . senna-docusate (Senokot-S) tablet 1 tablet  1 tablet Oral BID [name], MD   1 tablet at 06/24/15 1546  . tuberculin injection 5 Units  5 Units Intradermal Once [name], MD   5 Units at 06/24/15 1547   Facility-Administered Medications Ordered in Other Encounters  Medication Dose Route Frequency Provider Last Rate Last Dose  . heparin lock flush 100 unit/mL  500 Units Intravenous Once Forest Gleason, MD   500 Units at 11/20/14 1116  . sodium chloride 0.9 % injection 10 mL  10 mL Intravenous PRN Forest Gleason, MD   10 mL at 11/20/14 0916  . sodium chloride 0.9 % injection 10 mL  10 mL Intravenous PRN Forest Gleason, MD   10 mL at 01/21/15 0915     Discharge Medications: Please see discharge summary for a list of discharge medications.  START taking these medications   Details  fentaNYL (DURAGESIC - DOSED MCG/HR) 50 MCG/HR Place 1 patch (50 mcg total) onto the skin every 3 (three) days. Qty: 10 patch, Refills: 0    levofloxacin (LEVAQUIN) 750 MG tablet Take 1 tablet (750 mg total) by mouth daily before breakfast. Qty: 5 tablet, Refills: 0      CONTINUE these medications which have CHANGED   Details  ALPRAZolam (XANAX) 0.5 MG tablet Take 1 tablet (0.5 mg total) by mouth daily as needed for anxiety. Qty: 30 tablet, Refills: 0    insulin lispro (HUMALOG) 100 UNIT/ML KiwkPen Inject 0.04-0.12 mLs (4-12 Units total) into the skin 3 (three) times daily with meals. Qty: 15 mL, Refills: 11    oxyCODONE (ROXICODONE) 15 MG immediate  release tablet Take 1 tablet (15 mg total) by mouth every 6 (six) hours. Qty: 30 tablet, Refills: 0      CONTINUE these medications which have NOT CHANGED   Details  acetaminophen (TYLENOL) 325 MG tablet Take 650 mg by mouth every 6 (six) hours as needed for moderate pain, fever or headache.    albuterol (PROAIR HFA) 108 (90 BASE) MCG/ACT inhaler Inhale 2 puffs into the lungs every  6 (six) hours as needed for wheezing or shortness of breath.    Associated Diagnoses: Small cell carcinoma of lung, unspecified laterality (HCC)    cyclobenzaprine (FLEXERIL) 5 MG tablet Take 5 mg by mouth daily as needed for muscle spasms.     dabigatran (PRADAXA) 150 MG CAPS capsule TAKE ONE CAPSULE BY MOUTH TWICE DAILY    diphenoxylate-atropine (LOMOTIL) 2.5-0.025 MG tablet Take 1 tablet by mouth 4 (four) times daily as needed for diarrhea or loose stools. Qty: 30 tablet, Refills: 0    gabapentin (NEURONTIN) 100 MG capsule Take 100 mg by mouth See admin instructions. Take 1 capsule in the morning and 2pm. Take 2 capsules ('200mg'$ ) by mouth every night at bedtime.    glipiZIDE (GLUCOTROL) 10 MG tablet Take 10 mg by mouth See admin instructions. Take 1 tablet by mouth every morning and take 1/2 tablet ('5mg'$ ) by mouth in the evening. Refills: 0    levothyroxine (SYNTHROID, LEVOTHROID) 25 MCG tablet Take 25 mcg by mouth daily before breakfast.     lidocaine-prilocaine (EMLA) cream Apply 1 application topically as needed. Qty: 30 g, Refills: 3   Associated Diagnoses: Mass of lung; Cancer of upper lobe of right lung (HCC)    Magnesium-Calcium-Folic Acid 814-481-8 MG TABS Take 1 tablet by mouth daily.     metoprolol-hydrochlorothiazide (LOPRESSOR HCT) 50-25 MG per tablet Take 1 tablet by mouth daily.     ondansetron (ZOFRAN) 4 MG tablet Take 1 tablet (4 mg total) by mouth every 6 (six) hours as needed. Qty: 30 tablet, Refills: 3   Associated Diagnoses: Cancer of  upper lobe of right lung (HCC)    promethazine (PHENERGAN) 25 MG tablet TAKE ONE TABLET BY MOUTH EVERY SIX HOURSAS NEEDED FOR NAUSEA OR VOMITING Qty: 30 tablet, Refills: 0    RABEprazole (ACIPHEX) 20 MG tablet Take 20 mg by mouth daily.     senna-docusate (SENNA S) 8.6-50 MG tablet Take 1 tablet by mouth 2 (two) times daily. Qty: 60 tablet, Refills: 3   Associated Diagnoses: Cancer of upper lobe of right lung (HCC)    simvastatin (ZOCOR) 20 MG tablet Take 20 mg by mouth every evening.     isosorbide mononitrate (IMDUR) 60 MG 24 hr tablet Take 60 mg by mouth daily.     nitroGLYCERIN (NITROSTAT) 0.4 MG SL tablet Place 0.4 mg under the tongue every 5 (five) minutes as needed for chest pain.     potassium chloride (K-DUR) 10 MEQ tablet Take 2 tablets (20 mEq total) by mouth daily. Qty: 60 tablet, Refills: 0      STOP taking these medications     aspirin 81 MG tablet      fentaNYL (DURAGESIC - DOSED MCG/HR) 25 MCG/HR patch      metFORMIN (GLUCOPHAGE) 1000 MG tablet      furosemide (LASIX) 40 MG tablet      Liraglutide 18 MG/3ML SOPN      magic mouthwash w/lidocaine SOLN              Relevant Imaging Results:  Relevant Lab Results:   Additional Information SSN:  [id number]   Pt will have [facility] following at facility.   Darden Dates, LCSW

## 2015-06-24 NOTE — Evaluation (Signed)
Physical Therapy Evaluation Patient Details Name: Wendy Giles MRN: [id number] DOB: [birth year] Today's Date: 06/24/2015   History of Present Illness  Pt is an 82 y.o. female with a PMH of CHF, breast cancer, lung cancer with mets to liver, COPD, pacemaker, and diabetes.  Pt found by family member collapsed on floor near bed and not responsive and presented to hospital with metabolic encephalopathy d/t hypothermia and hypokalemia.    Clinical Impression  Prior to admission pt was living alone within her house and using a 4 point cane or standard walker (depending on how she was feeling) for ambulation.  Pt was on 3 L/min O2 via nasal cannula throughout PT session.  Pt was min guard for bed mobility and sit to stand transfer.  Pt ambulated 20 feet with RW and was min guard.  Pt said that she was moderately out of breathe and was tired after ambulation; however, pt's vitals were [facility] throughout session (O2 saturation: 98% O2 on 3 L/min O2; HR: 65 bpm).  Due to aforementioned function and strength deficits, pt is in need of skilled physical therapy.  It is recommended that pt is discharged with 24/7 assist and home health PT when medically appropriate.     Follow Up Recommendations Home health PT (with 24/7 assist)    Equipment Recommendations  Rolling walker with 5" wheels    Recommendations for Other Services       Precautions / Restrictions Precautions Precautions: Fall Precaution Comments: bedrest with bathroom privileges Restrictions Weight Bearing Restrictions: No      Mobility  Bed Mobility Overal bed mobility: Needs Assistance Bed Mobility: Rolling;Sidelying to Sit;Sit to Sidelying Rolling: Min guard Sidelying to sit: Min guard     Sit to sidelying: Min guard    Transfers Overall transfer level: Needs assistance Equipment used: Rolling walker (2 wheeled) Transfers: Sit to/from Stand Sit to Stand: Min guard            Ambulation/Gait Ambulation/Gait  assistance: Min guard Ambulation Distance (Feet): 20 Feet Assistive device: Rolling walker (2 wheeled) Gait Pattern/deviations: Wide base of support Gait velocity: decreased  Increased lateral sway B    Stairs            Wheelchair Mobility    Modified Rankin (Stroke Patients Only)       Balance Overall balance assessment: Needs assistance Sitting-balance support: Feet supported;No upper extremity supported Sitting balance-Leahy Scale: Good     Standing balance support: Bilateral upper extremity supported (RW) Standing balance-Leahy Scale: Good                               Pertinent Vitals/Pain Pain Assessment: 0-10 Pain Score: 6  Pain Descriptors / Indicators: Constant;Aching;Discomfort Pain Intervention(s): Limited activity within patient's tolerance;Monitored during session;Premedicated before session;Repositioned  Pt reporting decreased pain end of session (although would not rate) d/t pain meds starting to work. See flow sheet for vitals.     Home Living Family/patient expects to be discharged to:: Private residence Living Arrangements: Alone Available Help at Discharge: Friend(s)   Home Access: Stairs to enter Entrance Stairs-Rails: Right Entrance Stairs-Number of Steps: 4 Home Layout: One level Home Equipment: Walker - standard;Cane - quad      Prior Function Level of Independence: Independent with assistive device(s)         Comments: Utilizes a cane during the day and a walker at night.     Hand Dominance  Extremity/Trunk Assessment   Upper Extremity Assessment: Overall WFL for tasks assessed           Lower Extremity Assessment: Generalized weakness      Cervical / Trunk Assessment: Normal  Communication   Communication: No difficulties  Cognition Arousal/Alertness: Awake/alert Behavior During Therapy: WFL for tasks assessed/performed Overall Cognitive Status: Within Functional Limits for tasks  assessed                      General Comments   Nursing was contacted and cleared pt for physical therapy.  Pt was agreeable to session but noted to be fatigued at the end of the session.    Exercises        Assessment/Plan    PT Assessment Patient needs continued PT services  PT Diagnosis Generalized weakness   PT Problem List Decreased strength;Decreased activity tolerance;Decreased balance;Decreased mobility;Decreased coordination;Pain  PT Treatment Interventions DME instruction;Gait training;Stair training;Functional mobility training;Therapeutic activities;Therapeutic exercise;Patient/family education   PT Goals (Current goals can be found in the Care Plan section) Acute Rehab PT Goals Patient Stated Goal: to go home  PT Goal Formulation: With patient Time For Goal Achievement: 07/08/15 Potential to Achieve Goals: Good    Frequency Min 2X/week   Barriers to discharge        Co-evaluation               End of Session Equipment Utilized During Treatment: Gait belt;Oxygen (3 L/min via nasal cannula ) Activity Tolerance: Patient limited by fatigue Patient left: in bed;with call bell/phone within reach;with bed alarm set;with family/visitor present Nurse Communication: Mobility status         Time: 1478-2956 PT Time Calculation (min) (ACUTE ONLY): 34 min   Charges:         PT G Codes:       Mittie Bodo, SPT Mittie Bodo 06/24/2015, 3:18 PM

## 2015-06-24 NOTE — Care Management Important Message (Signed)
Important Message  Patient Details  Name: Wendy Giles MRN: [id number] Date of Birth: 05-03-1933   Medicare Important Message Given:  Yes    [name] 06/24/2015, 10:38 AM

## 2015-06-24 NOTE — Progress Notes (Signed)
[place] [facility] [place] NAME: Wendy Giles    MR#:  [id number]  DATE OF BIRTH:  1933-04-17  SUBJECTIVE:   Patient is here due to altered mental status from sepsis and also noted to be hypothermic and hypokalemic. Much improved and mental status is back to baseline.  Hypoglycemia resolved and taking PO now.    REVIEW OF SYSTEMS:    Review of Systems  Constitutional: Negative for fever and chills.  HENT: Negative for congestion and tinnitus.   Eyes: Negative for blurred vision and double vision.  Respiratory: Negative for cough, shortness of breath and wheezing.   Cardiovascular: Negative for chest pain, orthopnea and PND.  Gastrointestinal: Negative for nausea, vomiting, abdominal pain and diarrhea.  Genitourinary: Negative for dysuria and hematuria.  Neurological: Negative for dizziness, sensory change and focal weakness.  All other systems reviewed and are negative.   Nutrition: Clear liquid Tolerating Diet: yes  DRUG ALLERGIES:   Allergies  Allergen Reactions  . Amoxicillin-Pot Clavulanate Hives, Diarrhea and Other (See Comments)    Patient is unable to answer follow up questions and family in unaware of allergies  . Fentanyl Itching  . Codeine Nausea And Vomiting and Other (See Comments)    Other Reaction: "very ill"  . Latex Rash  . Other Rash    VITALS:  Blood pressure 145/69, pulse 64, temperature 97.7 F (36.5 C), temperature source Oral, resp. rate 19, height '5\' 6"'$  (1.676 m), weight 66.9 kg (147 lb 7.8 oz), SpO2 96 %.  PHYSICAL EXAMINATION:   Physical Exam  GENERAL:  82 y.o.-year-old patient sitting up in bed in NAD.   EYES: Pupils equal, round, reactive to light and accommodation. No scleral icterus. Extraocular muscles intact.  HEENT: Head atraumatic, normocephalic. Oropharynx and nasopharynx clear.  NECK:  Supple, no jugular venous distention. No thyroid enlargement, no tenderness.  LUNGS: Normal  breath sounds bilaterally, no wheezing, rales, rhonchi. No use of accessory muscles of respiration.  CARDIOVASCULAR: S1, S2 RRR. No murmurs, rubs, or gallops.  Pacemaker in place.  Right chest wall Port in place.  ABDOMEN: Soft, nontender, nondistended. Bowel sounds present. No organomegaly or mass.  EXTREMITIES: No cyanosis, clubbing or edema b/l.    NEUROLOGIC: Cranial nerves II through XII are intact. No focal Motor or sensory deficits b/l.   PSYCHIATRIC: The patient is alert and oriented x 3. Good affect.   SKIN: No obvious rash, lesion, or ulcer.    LABORATORY PANEL:   CBC  Recent Labs Lab 06/23/15 0604  WBC 9.1  HGB 10.8*  HCT 33.4*  PLT 266   ------------------------------------------------------------------------------------------------------------------  Chemistries   Recent Labs Lab 06/22/15 0622 06/23/15 0604  NA 139 136  K 2.8* 4.6  CL 102 106  CO2 26 26  GLUCOSE 53* 197*  BUN 26* 14  CREATININE 0.75 0.77  CALCIUM 8.9 8.2*  MG 1.9  --   AST 81*  --   ALT 36  --   ALKPHOS 359*  --   BILITOT 0.8  --    ------------------------------------------------------------------------------------------------------------------  Cardiac Enzymes  Recent Labs Lab 06/22/15 0622  TROPONINI <0.03   ------------------------------------------------------------------------------------------------------------------  RADIOLOGY:  No results found.   ASSESSMENT AND PLAN:   82 year old female with past medical history of breast cancer, metastatic lung cancer, diabetes, hypertension, chronic pain, anemia, osteoarthritis who presents to the hospital due to altered mental status.  #1 altered mental status-this is metabolic encephalopathy due to underlying hypoglycemia and hypothermia from underlying sepsis. -  CT head (-).  Hypothermia resolved and hemodynamically stable and Mental status much improved and back to baseline.   #2 hypoglycemia-due to underlying sepsis.  -  was on Dextrose gtt but discontinued and BS back to baseline.    #3 sepsis-patient presented with hypothermia, tachypnea and source is pneumonia.  - afebrile, hemodynamically stable now.  Cont. Levaquin and will switch to PO.  Cultures so far (-).   #4 pneumonia-suspected aspiration.  - cont. Levaquin X 7 days.   #5 hypokalemia-improved and resolved with supplementation.  - Mg. Level is normal.   #6 diarrhea- resolved and Stool for Comp. Culture is (-).  - tolerating regular diet now.  Will monitor and if needed consider PRN Imodium.   #7 diabetes type 2 without complication- BS stable.   - cont. SSI.;   #8 history of breast cancer and active metastatic lung cancer-patient's prognosis is very poor. Patient was seen by her oncologist a few days back and they recommended hospice services. -Discussed with hospice services and patient does not meet criteria for hospice home yet. Patient lives by herself and has no one to help take care of her as her husband is in a nursing home presently. Most of patient's family lives out of state. Discussed with clinical social work and we'll try to arrange for possible discharge to assisted living with hospice services.    All the records are reviewed and case discussed with Care Management/Social Workerr. Management plans discussed with the patient, family and they are in agreement.  CODE STATUS: DNR  DVT Prophylaxis: Lovenox  TOTAL TIME TAKING CARE OF THIS PATIENT: 30 minutes.   POSSIBLE D/C IN 1-2 DAYS, DEPENDING ON CLINICAL CONDITION.  Greater than 50% of time spent in discussion of coordination of care with social work and hospice services   [name] M.D on 06/24/2015 at 2:34 PM  Between 7am to 6pm - Pager - [phone]  After 6pm go to [email or web address] - password [email or web address] [facility]  Office  [phone]  CC: Primary care physician; Tracie Harrier, MD

## 2015-06-25 DIAGNOSIS — M1990 Unspecified osteoarthritis, unspecified site: Secondary | ICD-10-CM

## 2015-06-25 DIAGNOSIS — Z66 Do not resuscitate: Secondary | ICD-10-CM

## 2015-06-25 DIAGNOSIS — C3411 Malignant neoplasm of upper lobe, right bronchus or lung: Secondary | ICD-10-CM

## 2015-06-25 DIAGNOSIS — I10 Essential (primary) hypertension: Secondary | ICD-10-CM

## 2015-06-25 DIAGNOSIS — C787 Secondary malignant neoplasm of liver and intrahepatic bile duct: Secondary | ICD-10-CM

## 2015-06-25 DIAGNOSIS — D631 Anemia in chronic kidney disease: Secondary | ICD-10-CM

## 2015-06-25 DIAGNOSIS — J189 Pneumonia, unspecified organism: Secondary | ICD-10-CM

## 2015-06-25 DIAGNOSIS — A419 Sepsis, unspecified organism: Principal | ICD-10-CM

## 2015-06-25 DIAGNOSIS — I2510 Atherosclerotic heart disease of native coronary artery without angina pectoris: Secondary | ICD-10-CM

## 2015-06-25 DIAGNOSIS — E162 Hypoglycemia, unspecified: Secondary | ICD-10-CM

## 2015-06-25 DIAGNOSIS — Z853 Personal history of malignant neoplasm of breast: Secondary | ICD-10-CM

## 2015-06-25 DIAGNOSIS — E119 Type 2 diabetes mellitus without complications: Secondary | ICD-10-CM

## 2015-06-25 DIAGNOSIS — M4800 Spinal stenosis, site unspecified: Secondary | ICD-10-CM

## 2015-06-25 DIAGNOSIS — I480 Paroxysmal atrial fibrillation: Secondary | ICD-10-CM

## 2015-06-25 DIAGNOSIS — F329 Major depressive disorder, single episode, unspecified: Secondary | ICD-10-CM

## 2015-06-25 DIAGNOSIS — G9341 Metabolic encephalopathy: Secondary | ICD-10-CM

## 2015-06-25 DIAGNOSIS — R109 Unspecified abdominal pain: Secondary | ICD-10-CM

## 2015-06-25 DIAGNOSIS — J449 Chronic obstructive pulmonary disease, unspecified: Secondary | ICD-10-CM

## 2015-06-25 DIAGNOSIS — E876 Hypokalemia: Secondary | ICD-10-CM

## 2015-06-25 DIAGNOSIS — Z515 Encounter for palliative care: Secondary | ICD-10-CM

## 2015-06-25 DIAGNOSIS — Z8673 Personal history of transient ischemic attack (TIA), and cerebral infarction without residual deficits: Secondary | ICD-10-CM

## 2015-06-25 LAB — GLUCOSE, CAPILLARY
GLUCOSE-CAPILLARY: 166 mg/dL — AB (ref 65–99)
Glucose-Capillary: 234 mg/dL — ABNORMAL HIGH (ref 65–99)

## 2015-06-25 MED ORDER — BISACODYL 10 MG RE SUPP: 10.0000 mg | Freq: Once | RECTAL | Status: DC

## 2015-06-25 MED ORDER — SENNOSIDES-DOCUSATE SODIUM 8.6-50 MG PO TABS
2.0000 | ORAL_TABLET | Freq: Two times a day (BID) | ORAL | Status: DC
  Administered 2015-06-25 – 2015-06-26 (×2): 2 via ORAL
  Filled 2015-06-25 (×2): qty 2

## 2015-06-25 MED ORDER — INSULIN ASPART 100 UNIT/ML ~~LOC~~ SOLN
0.0000 [IU] | Freq: Three times a day (TID) | SUBCUTANEOUS | Status: DC
  Administered 2015-06-25 – 2015-06-26 (×3): 3 [IU] via SUBCUTANEOUS
  Filled 2015-06-25 (×3): qty 3

## 2015-06-25 MED ORDER — OXYCODONE HCL 5 MG PO TABS
15.0000 mg | ORAL_TABLET | Freq: Four times a day (QID) | ORAL | Status: DC
  Administered 2015-06-25 – 2015-06-26 (×5): 15 mg via ORAL
  Filled 2015-06-25 (×6): qty 3

## 2015-06-25 MED ORDER — BISACODYL 10 MG RE SUPP
10.0000 mg | Freq: Every day | RECTAL | Status: DC | PRN
  Administered 2015-06-25: 10 mg via RECTAL
  Filled 2015-06-25: qty 1

## 2015-06-25 MED ORDER — INSULIN ASPART 100 UNIT/ML ~~LOC~~ SOLN: 0.0000 [IU] | Freq: Every day | SUBCUTANEOUS | Status: DC

## 2015-06-25 MED ORDER — PROCHLORPERAZINE 25 MG RE SUPP: 25.0000 mg | Freq: Three times a day (TID) | RECTAL | Status: DC | PRN

## 2015-06-25 NOTE — Progress Notes (Signed)
[name] NAME: Wendy Giles    MR#:  [id number]  DATE OF BIRTH:  Mar 31, 1933  SUBJECTIVE:   Patient is here due to altered mental status from sepsis and also noted to be hypothermic and hypokalemic. Much improved and mental status is back to baseline.  Hypoglycemia resolved and taking PO now.  Having some abdominal pain.   REVIEW OF SYSTEMS:    Review of Systems  Constitutional: Negative for fever and chills.  HENT: Negative for congestion and tinnitus.   Eyes: Negative for blurred vision and double vision.  Respiratory: Negative for cough, shortness of breath and wheezing.   Cardiovascular: Negative for chest pain, orthopnea and PND.  Gastrointestinal: Positive for abdominal pain. Negative for nausea, vomiting and diarrhea.  Genitourinary: Negative for dysuria and hematuria.  Neurological: Negative for dizziness, sensory change and focal weakness.  All other systems reviewed and are negative.   Nutrition: Clear liquid Tolerating Diet: yes  DRUG ALLERGIES:   Allergies  Allergen Reactions  . Amoxicillin-Pot Clavulanate Hives, Diarrhea and Other (See Comments)    Patient is unable to answer follow up questions and family in unaware of allergies  . Latex Rash  . Other Rash    VITALS:  Blood pressure 157/64, pulse 65, temperature 98.4 F (36.9 C), temperature source Oral, resp. rate 19, height '5\' 6"'$  (1.676 m), weight 66.9 kg (147 lb 7.8 oz), SpO2 98 %.  PHYSICAL EXAMINATION:   Physical Exam  GENERAL:  82 y.o.-year-old patient lying  in bed in NAD.   EYES: Pupils equal, round, reactive to light and accommodation. No scleral icterus. Extraocular muscles intact.  HEENT: Head atraumatic, normocephalic. Oropharynx and nasopharynx clear.  NECK:  Supple, no jugular venous distention. No thyroid enlargement, no tenderness.  LUNGS: Normal breath sounds bilaterally, no wheezing, rales, rhonchi. No use of accessory  muscles of respiration.  CARDIOVASCULAR: S1, S2 RRR. No murmurs, rubs, or gallops.  Pacemaker in place.  Right chest wall Port in place.  ABDOMEN: Soft, Tender in mid-abdomen but no rebound, rigidity, nondistended. Bowel sounds present. No organomegaly or mass.  EXTREMITIES: No cyanosis, clubbing or edema b/l.    NEUROLOGIC: Cranial nerves II through XII are intact. No focal Motor or sensory deficits b/l.   PSYCHIATRIC: The patient is alert and oriented x 3. Good affect.   SKIN: No obvious rash, lesion, or ulcer.    LABORATORY PANEL:   CBC  Recent Labs Lab 06/23/15 0604  WBC 9.1  HGB 10.8*  HCT 33.4*  PLT 266   ------------------------------------------------------------------------------------------------------------------  Chemistries   Recent Labs Lab 06/22/15 0622 06/23/15 0604  NA 139 136  K 2.8* 4.6  CL 102 106  CO2 26 26  GLUCOSE 53* 197*  BUN 26* 14  CREATININE 0.75 0.77  CALCIUM 8.9 8.2*  MG 1.9  --   AST 81*  --   ALT 36  --   ALKPHOS 359*  --   BILITOT 0.8  --    ------------------------------------------------------------------------------------------------------------------  Cardiac Enzymes  Recent Labs Lab 06/22/15 0622  TROPONINI <0.03   ------------------------------------------------------------------------------------------------------------------  RADIOLOGY:  No results found.   ASSESSMENT AND PLAN:   82 year old female with past medical history of breast cancer, metastatic lung cancer, diabetes, hypertension, chronic pain, anemia, osteoarthritis who presents to the hospital due to altered mental status.  #1 altered mental status-this is metabolic encephalopathy due to underlying hypoglycemia and hypothermia from underlying sepsis. -CT head (-).  Hypothermia resolved and hemodynamically stable  and Mental status much improved and back to baseline.   #2 hypoglycemia-due to underlying sepsis.  - was on Dextrose gtt but discontinued and  BS back to baseline.    #3 sepsis-patient presented with hypothermia, tachypnea and source is pneumonia.  - afebrile, hemodynamically stable now.  Cont. Levaquin.  Cultures so far (-).   #4 pneumonia-suspected aspiration.  - cont. Levaquin X 7 days.   #5 hypokalemia-improved and resolved with supplementation.  - Mg. Level is normal.   #6 diarrhea- resolved and Stool for Comp. Culture is (-).  - tolerating regular diet now.  Will monitor and if needed consider PRN Imodium.   #7 diabetes type 2 without complication- BS stable.   - cont. SSI.  #8 history of breast cancer and active metastatic lung cancer-patient's prognosis is very poor. Patient was seen by her oncologist a few days back and they recommended hospice services. -Discussed with hospice services and patient does not meet criteria for hospice home yet. Patient lives by herself and has no one to help take care of her as her husband is in a nursing home presently. Most of patient's family lives out of state. Social work is planning for discharge to [facility] w/ Hospice possible tomorrow.   #9 Chronic pain - due to malignancy.  - cont. Fentanyl patch, Oxycodone.  Palliative Care adjusting pain meds.    All the records are reviewed and case discussed with Care Management/Social Workerr. Management plans discussed with the patient, family and they are in agreement.  CODE STATUS: DNR  DVT Prophylaxis: Lovenox  TOTAL TIME TAKING CARE OF THIS PATIENT: 25 minutes.   POSSIBLE D/C IN 1-2 DAYS, DEPENDING ON CLINICAL CONDITION.   [name] M.D on 06/25/2015 at 1:29 PM  Between 7am to 6pm - Pager - [phone]  After 6pm go to [email or web address] - password [email or web address] [name]  Office  [phone]  CC: Primary care physician; Tracie Harrier, MD

## 2015-06-25 NOTE — Progress Notes (Signed)
Visit made. Patient seen sitting up on the side of the bed, alert and interactive, about to eat lunch. Oxygen on at 2 liters via nasal cannula. Patient's friend Rip Harbour present. Writer has spoken to both [place], patient and Rip Harbour pending the confirmation from patient's brother [name],  plan is for patient to discharged tomorrow 2/15 to [facility] and will continue hospice services. Pain is currently being managed with fentanyl 50 mcg patch and oxycodone 15 mg q 6 hrs scheduled. Antibiotics have changed to oral. Senna S has been changed to 2 tabs BID, patient has only had a small bowel movement since admission. Will continue to follow through discharge and update hospice team. Thank you. Flo Shanks RN, [facility] [place], [facility] [phone] c

## 2015-06-25 NOTE — Consult Note (Addendum)
Palliative Medicine Inpatient Consult Note   Name: Wendy Giles Date: 06/25/2015 MRN: [id number]  DOB: 1933/04/01  Referring Physician: [name], MD  Brief History: Palliative Care consult requested for this 82 y.o. female for goals of medical therapy in patient with pnemonia, hypoglycemia, and stage IV breast cancer with mets to the liver.  Oncologist has recommended Hospice and pt was followed by Hospice at home due to her malignancy not being treatable. She had been found by a family member on the floor of her home unresponsive. It is thought that she gave herself too much insulin unintentionally as she was hypoglycemic.  She was cold and treated with a bear hugger as well as ABX for possible sepsis.  Pneumonia was suspected to be aspiration related.  She had some diarrhea and low potassium ---all resolved.    TODAY'S DISCUSSIONS AND DECISIONS:  1. Pt is DNR.  2.  Pt is not always able to make her own healthcare decisions.  She is at the moment, but there should be more clarity on this matter.  I have heard that there are HCPOA papers completed and listing her brother and son, Wendy Giles. BUT I do not have a copy and will try to obtain a copy for the record as this could come up and be a problem since there is a son she does not want involved at all. Legally the two sons have equal decision making authority UNLESS we see that HCPOA form and it says otherwise.    3.  Pt needs her short acting pain Rx increased before her long acting Fentanyl is increased.  She had her dose increased from 10 mg to 15 mg of Oxycodone, BUT she then decided not to ask for pain meds 'unless she couldn't take in anymore'. So she got behind in pain mgmt this am. Will schedule some routine short acting Rxs --she agreed that the 10 mg routinely would work and then she could have extra if needed.  THEN, after this goes into effect, she may later on need a higher dose of Fentanyl.  Note that pt is listed as 'allergic  to codeine and other narcotics 'makes her ill' and to fentanyl --causing itching. She is on opioids and fentanyl and is not having adverse effects other than those on her bowels --so I have DCd these allergies.   4.  Pt seems to be appropriate for Assisted Living at this time. That could change of course anytime. But for now, this is a good plan for her.  She will go there tomorrow apparently.   ------------------------------------------------------------------ IMPRESSION: Stage IV lung cancer (small cell--see note below from oncologist) with liver mets ---upper lobe right lung History of breast Cancer (see note below) Metabolic encephalopathy Hypoglycemia Sepsis with hypothermia  Pneumonia --probable aspiration --present on admission Hypokalemia Severe Degenerative Spine Disease with spinal stenosis Air seen in spine thecal sac on imaging ?iatragenic Abdominal Pain ---diarrhea reported day one but no BM noted since ---likely with constipation from opioids ---will increase bowel regimen ---probably needs an enema to get things moving (will order for tonite if no BM before then) ---pt NOT RELIABLE in recalling BMs so must chart and not ask her Prior Lacunar CVA COPD HTN Former Smoker 1 ppd for 18 yrs DM2 History of PACEMAKER Anemia of chronic disease CAD Paroxysmal Afib History of CHF--not this adm  REVIEW OF SYSTEMS:  Pt has not been asking for short acting pain Rxs until the pain is unbearable. Says she is 'afraid  of getting addicted'.    SPIRITUAL SUPPORT SYSTEM: Yes --son in [place] was here visiting. Brother in [place] is involved.  .  SOCIAL HISTORY:  reports that she quit smoking about 17 years ago. Her smoking use included Cigarettes. She has a 18 pack-year smoking history. She has never used smokeless tobacco. She reports that she does not drink alcohol or use illicit drugs.  Pt says she has two sons and one is involved and the other is not to be involved in any  decisions. Brother, [name], is in [place].  Son, Wendy Giles, is in [place].   Followed at home till this admission [facility].  Pt has intermittent confusion and cannot manage her insulin herself anymore so she is going to go to an Assisted living.  Husband is in a nursing facility (has Alzheimers).  LEGAL DOCUMENTS:  Portable DNR form is in the paper chart. I have asked for HCPOA form copies to be sent over from [place].    CODE STATUS: DNR  PAST MEDICAL HISTORY: Past Medical History  Diagnosis Date  . CHF (congestive heart failure) ([place])   . Diabetes mellitus without complication ([place])   . Hypertension   . Presence of permanent cardiac pacemaker   . Dysrhythmia     afib  . Depression   . GERD (gastroesophageal reflux disease)   . Coronary artery disease   . Cancer ([place])     breast,uterine  . COPD (chronic obstructive pulmonary disease) ([place])   . Arthritis   . Anemia   . Anginal pain ([place])   . Cancer of upper lobe of right lung ([place]) 11/03/2014  . Breast cancer ([place])     PAST SURGICAL HISTORY:  Past Surgical History  Procedure Laterality Date  . Cholecystectomy    . Coronary angioplasty    . Coronary artery bypass graft    . Appendectomy    . Mastectomy    . Ablation of dysrhythmic focus    . Cardioversion    . Joint replacement      tkr  . Insert / replace / remove pacemaker    . Peripheral vascular catheterization N/A 11/05/2014    Procedure: Glori Luis Cath Insertion;  Surgeon: Algernon Huxley, MD;  Location: [facility];  Service: Cardiovascular;  Laterality: N/A;  . Endobronchial ultrasound N/A 10/24/2014    Procedure: ENDOBRONCHIAL ULTRASOUND;  Surgeon: Vilinda Boehringer, MD;  Location: [facility];  Service: Cardiopulmonary;  Laterality: N/A;    ALLERGIES:  is allergic to amoxicillin-pot clavulanate; fentanyl; codeine; latex; and other.  MEDICATIONS:  Current Facility-Administered Medications  Medication Dose Route Frequency Provider Last Rate  Last Dose  . acetaminophen (TYLENOL) tablet 650 mg  650 mg Oral Q6H PRN [name], MD       Or  . acetaminophen (TYLENOL) suppository 650 mg  650 mg Rectal Q6H PRN [name], MD      . ALPRAZolam Duanne Moron) tablet 0.25 mg  0.25 mg Oral TID PRN [name], MD   0.25 mg at 06/23/15 0705  . alum & mag hydroxide-simeth (MAALOX/MYLANTA) 200-200-20 MG/5ML suspension 30 mL  30 mL Oral Q6H PRN [name], MD   30 mL at 06/22/15 1553  . antiseptic oral rinse (CPC / CETYLPYRIDINIUM CHLORIDE 0.05%) solution 7 mL  7 mL Mouth Rinse BID [name], MD   7 mL at 06/24/15 2054  . dabigatran (PRADAXA) capsule 150 mg  150 mg Oral Q12H Vivek J  Verdell Carmine, MD   150 mg at 06/24/15 2007  . feeding supplement (GLUCERNA SHAKE) (GLUCERNA SHAKE) liquid 237 mL  237 mL Oral TID BM [name], MD   237 mL at 06/25/15 0824  . fentaNYL (DURAGESIC - dosed mcg/hr) 50 mcg  50 mcg Transdermal Q72H Colleen Can, MD      . gabapentin (NEURONTIN) capsule 100 mg  100 mg Oral 2 times per day [name], MD   100 mg at 06/25/15 0816  . gabapentin (NEURONTIN) capsule 200 mg  200 mg Oral QHS [name], MD   200 mg at 06/24/15 2007  . ibuprofen (ADVIL,MOTRIN) tablet 400 mg  400 mg Oral Q6H PRN Nicholes Mango, MD      . levofloxacin (LEVAQUIN) tablet 750 mg  750 mg Oral QAC breakfast Lenis Noon, RPH   750 mg at 06/25/15 0817  . levothyroxine (SYNTHROID, LEVOTHROID) tablet 25 mcg  25 mcg Oral QAC breakfast [name], MD   25 mcg at 06/25/15 0817  . morphine CONCENTRATE 10 MG/0.5ML oral solution 10 mg  10 mg Oral Q2H PRN [name], MD   10 mg at 06/25/15 0015  . ondansetron (ZOFRAN) tablet 4 mg  4 mg Oral Q6H PRN [name], MD       Or  . ondansetron (ZOFRAN) injection 4 mg  4 mg Intravenous Q6H PRN [name], MD      . oxyCODONE (Oxy IR/ROXICODONE) immediate release tablet 15 mg  15 mg Oral Q4H PRN [name], MD   15 mg at 06/25/15 0817  . senna-docusate (Senokot-S)  tablet 1 tablet  1 tablet Oral BID [name], MD   1 tablet at 06/24/15 2007  . tuberculin injection 5 Units  5 Units Intradermal Once [name], MD   5 Units at 06/24/15 1547   Facility-Administered Medications Ordered in Other Encounters  Medication Dose Route Frequency Provider Last Rate Last Dose  . heparin lock flush 100 unit/mL  500 Units Intravenous Once Forest Gleason, MD   500 Units at 11/20/14 1116  . sodium chloride 0.9 % injection 10 mL  10 mL Intravenous PRN Forest Gleason, MD   10 mL at 11/20/14 0916  . sodium chloride 0.9 % injection 10 mL  10 mL Intravenous PRN Forest Gleason, MD   10 mL at 01/21/15 0915    Vital Signs: BP 157/64 mmHg  Pulse 65  Temp(Src) 98.4 F (36.9 C) (Oral)  Resp 19  Ht '5\' 6"'$  (1.676 m)  Wt 66.9 kg (147 lb 7.8 oz)  BMI 23.82 kg/m2  SpO2 98% Filed Weights   06/22/15 0623  Weight: 66.9 kg (147 lb 7.8 oz)    Estimated body mass index is 23.82 kg/(m^2) as calculated from the following:   Height as of this encounter: '5\' 6"'$  (1.676 m).   Weight as of this encounter: 66.9 kg (147 lb 7.8 oz).  PERFORMANCE STATUS (ECOG) : 3 - Symptomatic, >50% confined to bed  PHYSICAL EXAM: NAD --lying in medical bed with head of bed partly raised Alert and oriented She is taking some intentional deep breaths but is currently comfortable per her report and my observation EOMI OP clear Bald  NO JVD or TM Hrt rrr no m Lungs cta ant no rales but some miminal accessory muscle use Abd soft and NT Ext no cyanosis or mottling  LABS: CBC:    Component Value Date/Time   WBC 9.1 06/23/2015 0604  WBC 11.5* 01/13/2014 0904   HGB 10.8* 06/23/2015 0604   HGB 12.5 01/13/2014 0904   HCT 33.4* 06/23/2015 0604   HCT 40.2 01/13/2014 0904   PLT 266 06/23/2015 0604   PLT 324 01/13/2014 0904   MCV 88.2 06/23/2015 0604   MCV 82 01/13/2014 0904   NEUTROABS 10.0* 06/22/2015 0622   LYMPHSABS 1.0 06/22/2015 0622   MONOABS 0.8 06/22/2015 0622   EOSABS 0.1 06/22/2015  0622   BASOSABS 0.0 06/22/2015 0622   Comprehensive Metabolic Panel:    Component Value Date/Time   NA 136 06/23/2015 0604   NA 139 01/13/2014 0904   K 4.6 06/23/2015 0604   K 3.6 01/13/2014 0904   CL 106 06/23/2015 0604   CL 100 01/13/2014 0904   CO2 26 06/23/2015 0604   CO2 31 01/13/2014 0904   BUN 14 06/23/2015 0604   BUN 27* 01/13/2014 0904   CREATININE 0.77 06/23/2015 0604   CREATININE 1.19 01/13/2014 0904   GLUCOSE 197* 06/23/2015 0604   GLUCOSE 146* 01/13/2014 0904   CALCIUM 8.2* 06/23/2015 0604   CALCIUM 8.9 01/13/2014 0904   AST 81* 06/22/2015 0622   AST 20 01/13/2014 0904   ALT 36 06/22/2015 0622   ALT 21 01/13/2014 0904   ALKPHOS 359* 06/22/2015 0622   ALKPHOS 86 01/13/2014 0904   BILITOT 0.8 06/22/2015 0622   BILITOT 0.9 01/13/2014 0904   PROT 6.8 06/22/2015 0622   PROT 7.9 01/13/2014 0904   ALBUMIN 3.2* 06/22/2015 0622   ALBUMIN 4.0 01/13/2014 0904   CT head and neck 2/11: CT head: Stable atrophy with supratentorial small vessel disease. Prior lacunar infarct in the left globus pallidum, stable. No acute infarct. No mass, hemorrhage, or extra-axial fluid collection. Evidence of resolving right frontal scalp hematoma compared to prior study. CT cervical spine: No fracture. Slight spondylolisthesis at C7-T1 is felt to be due to underlying spondylosis. Extensive multilevel spondylosis/osteoarthritis. Moderate spinal stenosis at C4-5 is due to diffuse disc protrusion, asymmetric toward the left. This disc protrusion is calcified. Borderline stenosis is also present at C5-6.  Foci of air superiorly as described above. There are foci of air in the anterior thecal sac of the upper cervical spine as well as anterior to the superior aspect of the C2 vertebral body. A small focus of air is also noted immediately posterior to the upper odontoid. Air is seen in the superior right foramen transverse area in region.Question iatrogenic introduction of air into vessels  causing this problem. A traumatic source for this air is not apparent on current examination. There is calcification in both carotid arteries.   DR Choksi Note from 04/27/15 reported the following details about her cancers:   Patient presents with  . Lung Cancer   Carcinoma of breast   AJCC Staging: [id number]  Stage Grouping: I  Cancer Status: Evidence of disease.  Estrogen receptor positive and  progesterone receptor positive. HER- 2  Negative. 2. Abnormal CT scan of the chest (June, 2016) Oncology History   1. Right upper lobe carcinoma of lung stage IV metastases to the liver, small cell undifferentiated tumor diagnosis in June of 2016 [id number] stage IV Starting chemotherapy with carboplatinum VP-16 from July of 2016 2. Previous history of carcinoma breast stage I disease 3. Started on chemotherapy with carboplatinum and VP-16 from November 20, 2014 MRI scan of brain was negative (July, 2016)   Patient was started on carboplatinum and VP-16 which has been discontinued in September of 2016 (received total of 3 cycles  of chemotherapy) CT scan did not see any significant response 4, patient will be started on carboplatinum and CPT-11 from January 28, 2015  5. Patient had severe diarrhea so carboplatinum CPT-11 has been discontinued patient has been started on Taxotere and gemcitabine combination from November of 2016 6. Repeat CT scan in January of 2017 shows progressive disease on gemcitabine and Taxotere .so will be discontinued 7she has been referred to hospice for palliative care         More than 50% of the visit was spent in counseling/coordination of care: Yes Time Spent: 80 minutes

## 2015-06-25 NOTE — Clinical Social Work Note (Signed)
CSW presented an offer from [facility], which has been accepted. CSW spoke with POA (brother, Mitzi [facility]) who is agreeable to plan. CSW explained the process of discharging to an ALF. [facility] will be able to accept pt tomorrow. PPD will be read tomorrow. [facility] will be following. A credit check needs to be done, and a check will need to be provided by Financial POA. Pt has chosen a private room with a shared bath ($3876/month). [facility] is aware and may have to deliver O2 to facility. CSW also spoke with pt's friend, Rip Harbour, who will be able to assist pt with signing paperwork tomorrow. CSW updated [facility] of choice and room. They may evaluate pt in the AM. They will be able to accept pt in the PM. CSW will continue to follow.   Darden Dates, MSW, LCSW  Clinical Social Worker  [phone]

## 2015-06-26 LAB — GLUCOSE, CAPILLARY
GLUCOSE-CAPILLARY: 191 mg/dL — AB (ref 65–99)
GLUCOSE-CAPILLARY: 217 mg/dL — AB (ref 65–99)
Glucose-Capillary: 208 mg/dL — ABNORMAL HIGH (ref 65–99)

## 2015-06-26 LAB — CBC WITH DIFFERENTIAL/PLATELET
Basophils Absolute: 0.1 10*3/uL (ref 0–0.1)
Basophils Relative: 1 %
EOS ABS: 0.2 10*3/uL (ref 0–0.7)
Eosinophils Relative: 2 %
HEMATOCRIT: 35 % (ref 35.0–47.0)
HEMOGLOBIN: 11 g/dL — AB (ref 12.0–16.0)
LYMPHS ABS: 0.9 10*3/uL — AB (ref 1.0–3.6)
LYMPHS PCT: 9 %
MCH: 28.7 pg (ref 26.0–34.0)
MCHC: 31.4 g/dL — ABNORMAL LOW (ref 32.0–36.0)
MCV: 91.2 fL (ref 80.0–100.0)
MONOS PCT: 9 %
Monocytes Absolute: 0.9 10*3/uL (ref 0.2–0.9)
NEUTROS ABS: 8 10*3/uL — AB (ref 1.4–6.5)
NEUTROS PCT: 79 %
Platelets: 215 10*3/uL (ref 150–440)
RBC: 3.84 MIL/uL (ref 3.80–5.20)
RDW: 18.4 % — ABNORMAL HIGH (ref 11.5–14.5)
WBC: 10 10*3/uL (ref 3.6–11.0)

## 2015-06-26 LAB — CREATININE, SERUM
CREATININE: 0.79 mg/dL (ref 0.44–1.00)
GFR calc Af Amer: >60 mL/min (ref >60)
GFR calc non Af Amer: >60 mL/min (ref >60)

## 2015-06-26 MED ORDER — FENTANYL 50 MCG/HR TD PT72: 50.0000 ug | MEDICATED_PATCH | TRANSDERMAL | Status: DC

## 2015-06-26 MED ORDER — INSULIN LISPRO 100 UNIT/ML (KWIKPEN): 4.0000 [IU] | PEN_INJECTOR | Freq: Three times a day (TID) | SUBCUTANEOUS | Status: AC

## 2015-06-26 MED ORDER — INSULIN LISPRO 100 UNIT/ML (KWIKPEN): 4.0000 [IU] | PEN_INJECTOR | Freq: Three times a day (TID) | SUBCUTANEOUS | Status: DC

## 2015-06-26 MED ORDER — FENTANYL 75 MCG/HR TD PT72: 75.0000 ug | MEDICATED_PATCH | TRANSDERMAL | Status: DC

## 2015-06-26 MED ORDER — LEVOFLOXACIN 750 MG PO TABS: 750.0000 mg | ORAL_TABLET | Freq: Every day | ORAL | Status: DC

## 2015-06-26 MED ORDER — OXYCODONE HCL 15 MG PO TABS: 15.0000 mg | ORAL_TABLET | Freq: Four times a day (QID) | ORAL | Status: AC

## 2015-06-26 MED ORDER — HEPARIN SOD (PORK) LOCK FLUSH 10 UNIT/ML IV SOLN
10.0000 [IU] | Freq: Once | INTRAVENOUS | Status: DC
  Filled 2015-06-26: qty 1

## 2015-06-26 MED ORDER — HEPARIN SOD (PORK) LOCK FLUSH 100 UNIT/ML IV SOLN
500.0000 [IU] | Freq: Once | INTRAVENOUS | Status: AC
  Administered 2015-06-26: 14:00:00 500 [IU] via INTRAVENOUS
  Filled 2015-06-26: qty 5

## 2015-06-26 MED ORDER — ALPRAZOLAM 0.5 MG PO TABS: 0.5000 mg | ORAL_TABLET | Freq: Every day | ORAL | Status: DC | PRN

## 2015-06-26 NOTE — Progress Notes (Signed)
Palliative Medicine Inpatient Consult Follow Up Note   Name: Wendy Giles Date: 06/26/2015 MRN: [id number]  DOB: 10/14/1932  Referring Physician: [name], MD  Palliative Care consult requested for this 82 y.o. female for goals of medical therapy in patient with pnemonia, hypoglycemia, and stage IV breast cancer with mets to the liver. Oncologist has recommended Hospice and pt was followed by Hospice at home due to her malignancy not being treatable. She had been found by a family member on the floor of her home unresponsive. It is thought that she gave herself too much insulin unintentionally as she was hypoglycemic. She was cold and treated with a bear hugger as well as ABX for possible sepsis. Pneumonia was suspected to be aspiration related. She had some diarrhea and low potassium ---all resolved.   TODAY'S DISCUSSIONS AND DECISIONS: 1.  Pt is DNR 2.  She is to go to an ALF facility today --still under care by [facility]. 3.  Dr Wendy Giles has completed her DC meds.  He is leaving her on the Oxycodone 15 mg q 6 hrs routinely.  This seems to be working for her. It may need adjustment and she might need a Wendy dose of the Fentanyl Patch --when her pain exceeds control by this regimen.   4. She continues on some medical / curative meds. I would recommend that her primary MD take a look at these and consider de-escalating meds like statins etc that are not likely to have a role in prolonging her life or health given her stage IV cancer.  5. I remain quite concerned that pt was here without proper health care documents. She repeats that her attorney 'has taken care of all of this'. And, she says she had an appointment with her attorney Wendy Giles) today --but can't say when.  I was supplied with only a POA document which lists Wendy Giles and her brother as co-POA's.  Pt does not want Wendy Giles making decisions for her.  She wants the other son, Wendy Giles, making decisions.  In the  absence of a valide HCPOA, when she cannot make her own decisions, we would be in the position of having to call BOTH SONS to get a consensus on care decisions.  It may have already 'been taken care of'. BUT when we do not have the actual documents, they do not exist and we have to follow state law in terms of obtaining consent, etc.  I would encourage those involved in her care get this HCPOA document located or updated and copied to Hospice and the ALF, etc.  The HCPOA document (copy) is needed whenever (each time) the pt presents to the hospital..   6. Pt probably has a mild / early dementia (possibly Alzheimers type but too early and mild to call it that with any certainty). She confabulates some and she also has short term recall deficits. She can (for now) make her own decisions and can designate who can be HCPOA/ POA --etc. But soon, this may not be the case.    IMPRESSION: Stage IV lung cancer (small cell--see note below from oncologist) with liver mets ---upper lobe right lung History of breast Cancer (see note below) Metabolic encephalopathy Hypoglycemia Sepsis with hypothermia  Pneumonia --probable aspiration --present on admission Hypokalemia Severe Degenerative Spine Disease with spinal stenosis Air seen in spine thecal sac on imaging ?iatragenic Abdominal Pain ---diarrhea reported day one but no BM noted since ---likely with constipation from opioids ---will increase bowel regimen ---  probably needs an enema to get things moving (will order for tonite if no BM before then) ---pt NOT RELIABLE in recalling BMs so must chart and not ask her Prior Lacunar CVA COPD HTN Former Smoker 1 ppd for 18 yrs DM2 History of PACEMAKER Anemia of chronic disease CAD Paroxysmal Afib History of CHF--not this adm Probable Mild/ Early Dementia  ---with short term recall deficits noted    CODE STATUS: DNR   PAST MEDICAL HISTORY: Past Medical History  Diagnosis Date  . CHF (congestive  heart failure) (Wendy Giles)   . Diabetes mellitus without complication (Wendy Giles)   . Hypertension   . Presence of permanent cardiac pacemaker   . Dysrhythmia     afib  . Depression   . GERD (gastroesophageal reflux disease)   . Coronary artery disease   . Cancer (Wendy Giles)     breast,uterine  . COPD (chronic obstructive pulmonary disease) (Wendy Giles)   . Arthritis   . Anemia   . Anginal pain (Wendy Giles)   . Cancer of upper lobe of right lung (Wendy Giles) 11/03/2014  . Breast cancer (Wendy Giles)     PAST SURGICAL HISTORY:  Past Surgical History  Procedure Laterality Date  . Cholecystectomy    . Coronary angioplasty    . Coronary artery bypass graft    . Appendectomy    . Mastectomy    . Ablation of dysrhythmic focus    . Cardioversion    . Joint replacement      tkr  . Insert / replace / remove pacemaker    . Peripheral vascular catheterization N/A 11/05/2014    Procedure: Wendy Giles Cath Insertion;  Surgeon: Wendy Huxley, MD;  Location: [facility];  Service: Cardiovascular;  Laterality: N/A;  . Endobronchial ultrasound N/A 10/24/2014    Procedure: ENDOBRONCHIAL ULTRASOUND;  Surgeon: Vilinda Boehringer, MD;  Location: [facility] ORS;  Service: Cardiopulmonary;  Laterality: N/A;    Vital Signs: BP 146/76 mmHg  Pulse 65  Temp(Src) 98.5 F (36.9 Giles) (Oral)  Resp 20  Ht '5\' 6"'$  (1.676 m)  Wt 66.9 kg (147 lb 7.8 oz)  BMI 23.82 kg/m2  SpO2 98% Filed Weights   06/22/15 0623  Weight: 66.9 kg (147 lb 7.8 oz)    Estimated body mass index is 23.82 kg/(m^2) as calculated from the following:   Height as of this encounter: '5\' 6"'$  (1.676 m).   Weight as of this encounter: 66.9 kg (147 lb 7.8 oz).  PHYSICAL EXAM: Alert sitting up on side of bed eating lunch NAD No current pain (seems to be doing well with Oxycodone 15 mg q 6 hrs routinely plus the Fentanyl Patch 50 mcg) EOMI OP clear Neck no JVD or TM Hrt rrr no m Lungs cta Abd soft and NT Skin pale warm and dry  LABS: CBC:    Component Value Date/Time   WBC 10.0  06/26/2015 0549   WBC 11.5* 01/13/2014 0904   HGB 11.0* 06/26/2015 0549   HGB 12.5 01/13/2014 0904   HCT 35.0 06/26/2015 0549   HCT 40.2 01/13/2014 0904   PLT 215 06/26/2015 0549   PLT 324 01/13/2014 0904   MCV 91.2 06/26/2015 0549   MCV 82 01/13/2014 0904   NEUTROABS 8.0* 06/26/2015 0549   LYMPHSABS 0.9* 06/26/2015 0549   MONOABS 0.9 06/26/2015 0549   EOSABS 0.2 06/26/2015 0549   BASOSABS 0.1 06/26/2015 0549   Comprehensive Metabolic Panel:    Component Value Date/Time   NA 136 06/23/2015 0604   NA 139 01/13/2014 0904  K 4.6 06/23/2015 0604   K 3.6 01/13/2014 0904   CL 106 06/23/2015 0604   CL 100 01/13/2014 0904   CO2 26 06/23/2015 0604   CO2 31 01/13/2014 0904   BUN 14 06/23/2015 0604   BUN 27* 01/13/2014 0904   CREATININE 0.79 06/26/2015 0549   CREATININE 1.19 01/13/2014 0904   GLUCOSE 197* 06/23/2015 0604   GLUCOSE 146* 01/13/2014 0904   CALCIUM 8.2* 06/23/2015 0604   CALCIUM 8.9 01/13/2014 0904   AST 81* 06/22/2015 0622   AST 20 01/13/2014 0904   ALT 36 06/22/2015 0622   ALT 21 01/13/2014 0904   ALKPHOS 359* 06/22/2015 0622   ALKPHOS 86 01/13/2014 0904   BILITOT 0.8 06/22/2015 0622   BILITOT 0.9 01/13/2014 0904   PROT 6.8 06/22/2015 0622   PROT 7.9 01/13/2014 0904   ALBUMIN 3.2* 06/22/2015 0622   ALBUMIN 4.0 01/13/2014 0904    More than 50% of the visit was spent in counseling/coordination of care: YES  Time Spent:  25 min

## 2015-06-26 NOTE — Progress Notes (Signed)
Visit made. Patient seen sitting up on the side of the bed, alert, reports some anxiety about her discharge plan. Writer provided reassurance and reviewed the plan. [facility]xygen delivered to [facility] and a portable tank has been delivered to patient's room as plan was for transport via [place]. Per CSW Sarah patient now to transport via EMS. Discharge summary faxed to Hospice triage.  Flo Shanks RN, BSN, [place] [facility] [place], [facility] [phone] c

## 2015-06-26 NOTE — Care Management Important Message (Signed)
Important Message  Patient Details  Name: Wendy Giles MRN: [id number] Date of Birth: Oct 29, 1932   Medicare Important Message Given:  Yes    Juliann Pulse A Kathan Kirker 06/26/2015, 10:15 AM

## 2015-06-26 NOTE — Discharge Summary (Addendum)
[place] [facility] [place] NAME: Wendy Giles    MR#:  [id number]  DATE OF BIRTH:  1932/10/27  DATE OF ADMISSION:  06/22/2015 ADMITTING PHYSICIAN: [name], MD  DATE OF DISCHARGE: 06/26/2015  PRIMARY CARE PHYSICIAN: Tracie Harrier, MD    ADMISSION DIAGNOSIS:  Hypoglycemia [E16.2] Community acquired pneumonia [J18.9] Hypothermia, initial encounter [T68.XXXA] Cancer of upper lobe of right lung ([place]) [C34.11] Type 2 diabetes mellitus with complication, with long-term current use of insulin (HCC) [E11.8, Z79.4]  DISCHARGE DIAGNOSIS:  Active Problems:   Sepsis ([place])   Protein-calorie malnutrition, severe   Hypoglycemia   SECONDARY DIAGNOSIS:   Past Medical History  Diagnosis Date  . CHF (congestive heart failure) ([place])   . Diabetes mellitus without complication ([place])   . Hypertension   . Presence of permanent cardiac pacemaker   . Dysrhythmia     afib  . Depression   . GERD (gastroesophageal reflux disease)   . Coronary artery disease   . Cancer ([facility])     breast,uterine  . COPD (chronic obstructive pulmonary disease) ([place])   . Arthritis   . Anemia   . Anginal pain ([place])   . Cancer of upper lobe of right lung ([place]) 11/03/2014  . Breast cancer [facility])     HOSPITAL COURSE:   82 year old female with past medical history of breast cancer, metastatic lung cancer, diabetes, hypertension, chronic pain, anemia, osteoarthritis who presents to the hospital due to altered mental status.  #1 altered mental status-this was metabolic encephalopathy due to underlying hypoglycemia and hypothermia from underlying sepsis. -Patient's CT head was (-) on admission. Patient's Hypothermia has now resolved and she is hemodynamically stable and her Mental status is much improved and back to baseline and she is being discharged home today.   #2 hypoglycemia-this was due to underlying sepsis and poor PO intake.  -Initially patient was  on a dextrose drip but that has not been discontinued and her blood sugars are presently stable. I have taken her off some of her diabetic meds presently.   #3 sepsis-patient presented with hypothermia, tachypnea.  The source of the sepsis was thought to be pneumonia and likely this was aspiration pneumonia. -Patient was empirically started on IV Levaquin and has improved and is currently hemodynamically stable and afebrile. Her cultures have been negative.    #4 pneumonia-aspiration.  - Patient was treated with IV Levaquin and now being discharged on oral Levaquin and she will finish a total of 7 days of therapy.  #5 hypokalemia-this was due to the diarrhea and has since then been supplemented and now resolved.  #6 diarrhea-she presented with some diarrhea. Her stool for comp intensive culture has been negative. She is tolerating a regular diet now. She can continue Imodium as needed as an outpatient.  #7 diabetes type 2 without complication-patient's blood sugars have improved now she initially presented hypoglycemic. -She will continue glipizide, and lispro sliding scale. I am taking her off the metformin, Liraglutide for now.  - once her PO intake improves a bit more then other agents can be slowly added if needed.  - please check sugars TID before meals and at bedtime at the assisted Living.   #8 history of breast cancer and active metastatic lung cancer-patient's prognosis is poor. Patient was seen by her oncologist and they recommended hospice services. -Discussed with hospice services and patient does not meet criteria for hospice home yet. Patient lives by herself and has no one to help  take care of her as her husband is in a nursing home presently. Most of patient's family lives out of state.  - pt. Is going to be discharged to assisted Living w/ Hospice services today.   #9 Chronic pain - due to malignancy.  - cont. Fentanyl patch, Oxycodone scheduled and it has improved and is  stable.   #10 Anxiety - cont. Xanax as needed.   DISCHARGE CONDITIONS:   Stable.   CONSULTS OBTAINED:     DRUG ALLERGIES:   Allergies  Allergen Reactions  . Amoxicillin-Pot Clavulanate Hives, Diarrhea and Other (See Comments)    Patient is unable to answer follow up questions and family in unaware of allergies  . Latex Rash  . Other Rash    DISCHARGE MEDICATIONS:   Current Discharge Medication List    START taking these medications   Details  fentaNYL (DURAGESIC - DOSED MCG/HR) 50 MCG/HR Place 1 patch (50 mcg total) onto the skin every 3 (three) days. Qty: 10 patch, Refills: 0    levofloxacin (LEVAQUIN) 750 MG tablet Take 1 tablet (750 mg total) by mouth daily before breakfast. Qty: 5 tablet, Refills: 0      CONTINUE these medications which have CHANGED   Details  ALPRAZolam (XANAX) 0.5 MG tablet Take 1 tablet (0.5 mg total) by mouth daily as needed for anxiety. Qty: 30 tablet, Refills: 0    insulin lispro (HUMALOG) 100 UNIT/ML KiwkPen Inject 0.04-0.12 mLs (4-12 Units total) into the skin 4 (four) times daily -  with meals and at bedtime. < 150 BS - no insulin, > 151-200 - give 4 units, 201-250 - given 6 units, 251-300 - given 8 units, 301-350 - give 10 units, 351-400 - give 12 units.  Call MD for BS > 400. Qty: 15 mL, Refills: 11    oxyCODONE (ROXICODONE) 15 MG immediate release tablet Take 1 tablet (15 mg total) by mouth every 6 (six) hours. Qty: 30 tablet, Refills: 0      CONTINUE these medications which have NOT CHANGED   Details  acetaminophen (TYLENOL) 325 MG tablet Take 650 mg by mouth every 6 (six) hours as needed for moderate pain, fever or headache.    albuterol (PROAIR HFA) 108 (90 BASE) MCG/ACT inhaler Inhale 2 puffs into the lungs every 6 (six) hours as needed for wheezing or shortness of breath.    Associated Diagnoses: Small cell carcinoma of lung, unspecified laterality (HCC)    cyclobenzaprine (FLEXERIL) 5 MG tablet Take 5 mg by mouth daily as  needed for muscle spasms.     dabigatran (PRADAXA) 150 MG CAPS capsule TAKE ONE CAPSULE BY MOUTH TWICE DAILY    diphenoxylate-atropine (LOMOTIL) 2.5-0.025 MG tablet Take 1 tablet by mouth 4 (four) times daily as needed for diarrhea or loose stools. Qty: 30 tablet, Refills: 0    gabapentin (NEURONTIN) 100 MG capsule Take 100 mg by mouth See admin instructions. Take 1 capsule in the morning and 2pm. Take 2 capsules ('200mg'$ ) by mouth every night at bedtime.    glipiZIDE (GLUCOTROL) 10 MG tablet Take 10 mg by mouth See admin instructions. Take 1 tablet by mouth every morning and take 1/2 tablet ('5mg'$ ) by mouth in the evening. Refills: 0    levothyroxine (SYNTHROID, LEVOTHROID) 25 MCG tablet Take 25 mcg by mouth daily before breakfast.     lidocaine-prilocaine (EMLA) cream Apply 1 application topically as needed. Qty: 30 g, Refills: 3   Associated Diagnoses: Mass of lung; Cancer of upper lobe of right lung (  HCC)    Magnesium-Calcium-Folic Acid 950-932-6 MG TABS Take 1 tablet by mouth daily.     metoprolol-hydrochlorothiazide (LOPRESSOR HCT) 50-25 MG per tablet Take 1 tablet by mouth daily.     ondansetron (ZOFRAN) 4 MG tablet Take 1 tablet (4 mg total) by mouth every 6 (six) hours as needed. Qty: 30 tablet, Refills: 3   Associated Diagnoses: Cancer of upper lobe of right lung (HCC)    promethazine (PHENERGAN) 25 MG tablet TAKE ONE TABLET BY MOUTH EVERY SIX HOURSAS NEEDED FOR NAUSEA OR VOMITING Qty: 30 tablet, Refills: 0    RABEprazole (ACIPHEX) 20 MG tablet Take 20 mg by mouth daily.     senna-docusate (SENNA S) 8.6-50 MG tablet Take 1 tablet by mouth 2 (two) times daily. Qty: 60 tablet, Refills: 3   Associated Diagnoses: Cancer of upper lobe of right lung (HCC)    simvastatin (ZOCOR) 20 MG tablet Take 20 mg by mouth every evening.     isosorbide mononitrate (IMDUR) 60 MG 24 hr tablet Take 60 mg by mouth daily.     nitroGLYCERIN (NITROSTAT) 0.4 MG SL tablet Place 0.4 mg under the  tongue every 5 (five) minutes as needed for chest pain.     potassium chloride (K-DUR) 10 MEQ tablet Take 2 tablets (20 mEq total) by mouth daily. Qty: 60 tablet, Refills: 0      STOP taking these medications     aspirin 81 MG tablet      fentaNYL (DURAGESIC - DOSED MCG/HR) 25 MCG/HR patch      metFORMIN (GLUCOPHAGE) 1000 MG tablet      furosemide (LASIX) 40 MG tablet      Liraglutide 18 MG/3ML SOPN      magic mouthwash w/lidocaine SOLN          DISCHARGE INSTRUCTIONS:   DIET:  Cardiac diet and Diabetic diet  DISCHARGE CONDITION:  Stable  ACTIVITY:  Activity as tolerated  OXYGEN:  Home Oxygen: Yes   Oxygen Delivery: 2-3 L [place] O2 Continuous.   DISCHARGE LOCATION:  Assisted Living with Hospice.    If you experience worsening of your admission symptoms, develop shortness of breath, life threatening emergency, suicidal or homicidal thoughts you must seek medical attention immediately by calling 911 or calling your MD immediately  if symptoms less severe.  You Must read complete instructions/literature along with all the possible adverse reactions/side effects for all the Medicines you take and that have been prescribed to you. Take any new Medicines after you have completely understood and accpet all the possible adverse reactions/side effects.   Please note  You were cared for by a hospitalist during your hospital stay. If you have any questions about your discharge medications or the care you received while you were in the hospital after you are discharged, you can call the unit and asked to speak with the hospitalist on call if the hospitalist that took care of you is not available. Once you are discharged, your primary care physician will handle any further medical issues. Please note that NO REFILLS for any discharge medications will be authorized once you are discharged, as it is imperative that you return to your primary care physician (or establish a relationship  with a primary care physician if you do not have one) for your aftercare needs so that they can reassess your need for medications and monitor your lab values.     Today   No complaints presently. Feels better. Po intake improving.   VITAL SIGNS:  Blood pressure 146/76, pulse 65, temperature 98.5 F (36.9 C), temperature source Oral, resp. rate 20, height '5\' 6"'$  (1.676 m), weight 66.9 kg (147 lb 7.8 oz), SpO2 98 %.  I/O:    Intake/Output Summary (Last 24 hours) at 06/26/15 1441 Last data filed at 06/26/15 1300  Gross per 24 hour  Intake    360 ml  Output      0 ml  Net    360 ml    PHYSICAL EXAMINATION:   GENERAL: 82 y.o.-year-old cachectic patient sitting up in bed in NAD.  EYES: Pupils equal, round, reactive to light and accommodation. No scleral icterus. Extraocular muscles intact.  HEENT: Head atraumatic, normocephalic. Oropharynx and nasopharynx clear.  NECK: Supple, no jugular venous distention. No thyroid enlargement, no tenderness.  LUNGS: Normal breath sounds bilaterally, no wheezing, rales, rhonchi. No use of accessory muscles of respiration.  CARDIOVASCULAR: S1, S2 RRR. No murmurs, rubs, or gallops. Pacemaker in place. Right chest wall Port in place.  ABDOMEN: Soft, Tender in mid-abdomen but no rebound, rigidity, nondistended. Bowel sounds present. No organomegaly or mass.  EXTREMITIES: No cyanosis, clubbing or edema b/l.  NEUROLOGIC: Cranial nerves II through XII are intact. No focal Motor or sensory deficits b/l.  PSYCHIATRIC: The patient is alert and oriented x 3. Good affect.  SKIN: No obvious rash, lesion, or ulcer.    DATA REVIEW:   CBC  Recent Labs Lab 06/26/15 0549  WBC 10.0  HGB 11.0*  HCT 35.0  PLT 215    Chemistries   Recent Labs Lab 06/22/15 0622 06/23/15 0604 06/26/15 0549  NA 139 136  --   K 2.8* 4.6  --   CL 102 106  --   CO2 26 26  --   GLUCOSE 53* 197*  --   BUN 26* 14  --   CREATININE 0.75 0.77 0.79   CALCIUM 8.9 8.2*  --   MG 1.9  --   --   AST 81*  --   --   ALT 36  --   --   ALKPHOS 359*  --   --   BILITOT 0.8  --   --     Cardiac Enzymes  Recent Labs Lab 06/22/15 0622  TROPONINI <0.03    Microbiology Results  Results for orders placed or performed during the hospital encounter of 06/22/15  Culture, blood (routine x 2)     Status: None (Preliminary result)   Collection Time: 06/22/15  7:34 AM  Result Value Ref Range Status   Specimen Description BLOOD  CVL  Final   Special Requests   Final    BOTTLES DRAWN AEROBIC AND ANAEROBIC  AER 5CC ANA 3CC   Culture NO GROWTH 4 DAYS  Final   Report Status PENDING  Incomplete  Culture, blood (routine x 2)     Status: None (Preliminary result)   Collection Time: 06/22/15  7:34 AM  Result Value Ref Range Status   Specimen Description BLOOD LEFT FOREARM  Final   Special Requests BOTTLES DRAWN AEROBIC ONLY  3CC  Final   Culture NO GROWTH 4 DAYS  Final   Report Status PENDING  Incomplete  Gastrointestinal Panel by PCR , Stool     Status: None   Collection Time: 06/22/15 11:01 AM  Result Value Ref Range Status   Campylobacter species NOT DETECTED NOT DETECTED Final   Plesimonas shigelloides NOT DETECTED NOT DETECTED Final   Salmonella species NOT DETECTED NOT DETECTED Final   Yersinia enterocolitica NOT  DETECTED NOT DETECTED Final   Vibrio species NOT DETECTED NOT DETECTED Final   Vibrio cholerae NOT DETECTED NOT DETECTED Final   Enteroaggregative E coli (EAEC) NOT DETECTED NOT DETECTED Final   Enteropathogenic E coli (EPEC) NOT DETECTED NOT DETECTED Final   Enterotoxigenic E coli (ETEC) NOT DETECTED NOT DETECTED Final   Shiga like toxin producing E coli (STEC) NOT DETECTED NOT DETECTED Final   E. coli O157 NOT DETECTED NOT DETECTED Final   Shigella/Enteroinvasive E coli (EIEC) NOT DETECTED NOT DETECTED Final   Cryptosporidium NOT DETECTED NOT DETECTED Final   Cyclospora cayetanensis NOT DETECTED NOT DETECTED Final   Entamoeba  histolytica NOT DETECTED NOT DETECTED Final   Giardia lamblia NOT DETECTED NOT DETECTED Final   Adenovirus F40/41 NOT DETECTED NOT DETECTED Final   Astrovirus NOT DETECTED NOT DETECTED Final   Norovirus GI/GII NOT DETECTED NOT DETECTED Final   Rotavirus A NOT DETECTED NOT DETECTED Final   Sapovirus (I, II, IV, and V) NOT DETECTED NOT DETECTED Final  C difficile quick scan w PCR reflex     Status: None   Collection Time: 06/22/15 11:01 AM  Result Value Ref Range Status   C Diff antigen NEGATIVE NEGATIVE Final   C Diff toxin NEGATIVE NEGATIVE Final   C Diff interpretation Negative for C. difficile  Final    RADIOLOGY:  No results found.    Management plans discussed with the patient, family and they are in agreement.  CODE STATUS:     Code Status Orders        Start     Ordered   06/22/15 1215  Do not attempt resuscitation (DNR)   Continuous    Question Answer Comment  In the event of cardiac or respiratory ARREST Do not call a "code blue"   In the event of cardiac or respiratory ARREST Do not perform Intubation, CPR, defibrillation or ACLS   In the event of cardiac or respiratory ARREST Use medication by any route, position, wound care, and other measures to relive pain and suffering. May use oxygen, suction and manual treatment of airway obstruction as needed for comfort.      06/22/15 1215    Code Status History    Date Active Date Inactive Code Status Order ID Comments User Context   This patient has a current code status but no historical code status.    Advance Directive Documentation        Most Recent Value   Type of Advance Directive  Healthcare Power of Attorney, Living will   Pre-existing out of facility DNR order (yellow form or pink MOST form)     "MOST" Form in Place?        TOTAL TIME TAKING CARE OF THIS PATIENT: 40 minutes.    [name] M.D on 06/26/2015 at 2:41 PM  Between 7am to 6pm - Pager - [phone]  After 6pm go to [email or web address] -  password [email or web address] [place] [facility]  Office  [phone]  CC: Primary care physician; Tracie Harrier, MD

## 2015-06-26 NOTE — Clinical Social Work Placement (Signed)
   CLINICAL SOCIAL WORK PLACEMENT  NOTE  Date:  06/26/2015  Patient Details  Name: Wendy Giles MRN: [id number] Date of Birth: [birth year]  Clinical Social Work is seeking post-discharge placement for this patient at the [place] level of care (*CSW will initial, date and re-position this form in  chart as items are completed):  Yes   Patient/family provided with [place] Work Department's list of facilities offering this level of care within the geographic area requested by the patient (or if unable, by the patient's family).  Yes   Patient/family informed of their freedom to choose among providers that offer the needed level of care, that participate in Medicare, Medicaid or managed care program needed by the patient, have an available bed and are willing to accept the patient.  No (ALF List)   Patient/family informed of [place]'s ownership interest in [facility] and [facility], as well as of the fact that they are under no obligation to receive care at these facilities.  PASRR submitted to EDS on       PASRR number received on       Existing PASRR number confirmed on       FL2 transmitted to all facilities in geographic area requested by pt/family on 06/24/15     FL2 transmitted to all facilities within larger geographic area on       Patient informed that his/her managed care company has contracts with or will negotiate with certain facilities, including the following:        Yes   Patient/family informed of bed offers received.  Patient chooses bed at  [facility])     Physician recommends and patient chooses bed at  [facility])    Patient to be transferred to  [facility]) on 06/26/15.  Patient to be transferred to facility by [facility]     Patient family notified on 06/26/15 of transfer.  Name of family member notified:  Rip Harbour, friend; Mitzi Hansen, brother     PHYSICIAN       Additional Comment:   PASARR is  not needed for facility.  _______________________________________________ Darden Dates, LCSW 06/26/2015, 1:30 PM

## 2015-06-26 NOTE — Clinical Social Work Note (Signed)
Pt is ready for discharge today and will go to [facility]. Pt's POA is aware and agreeable. Mitzi Hansen, pt's brother, is aware as well. Mitzi Hansen has spoken to facility. Ray Deal, pt's attorney is providing payment. Rip Harbour will be assisting pt to sign paperwork, pt's brother is aware and agreeable to this. Rip Harbour also brought pt's walker and medications from home to facility. [facility] provided O2 and they will be following at facility. All discharge paperwork has been sent to facility. PPD has been read and is negative. RN documented on order for facility. CSW updated facility and family. Pt is agreeable to discharge plan as well. CSW notified RN that everything is complete. [facility] will provide transportation. CSW is signing off as no further needs identified.   Darden Dates, MSW, LCSW Clinical Social Worker  [phone]

## 2015-06-26 NOTE — NC FL2 (Signed)
[place] LEVEL OF CARE SCREENING TOOL     IDENTIFICATION  Patient Name: Genecis Veley Birthdate: May 27, 1932 Sex: female Admission Date (Current Location): 06/22/2015  [facility] and [place] Number:  Engineering geologist and Address:  [facility], [address], [place], [place] [address]      Provider Number: [id number]  Attending Physician Name and Address:  [name], MD  Relative Name and Phone Number:       Current Level of Care: Hospital Recommended Level of Care: [place] Prior Approval Number:    Date Approved/Denied:   PASRR Number:    Discharge Plan: Domiciliary (Rest home)    Current Diagnoses: Patient Active Problem List   Diagnosis Date Noted  . Hypoglycemia   . Protein-calorie malnutrition, severe 06/24/2015  . Sepsis ([place]) 06/22/2015  . Cancer of upper lobe of right lung ([place]) 11/03/2014  . History of biliary T-tube placement 10/29/2014  . Mass of lung   . Breath shortness 01/02/2014  . Artificial cardiac pacemaker 01/01/2014  . Diabetes ([place]) 01/01/2014  . H/O cardiac catheterization 01/01/2014  . History of cardioversion 01/01/2014  . S/P coronary artery balloon dilation 01/01/2014  . Paroxysmal atrial fibrillation ([place]) 01/01/2014  . Diabetes mellitus ([place]) 01/01/2014    Orientation RESPIRATION BLADDER Height & Weight     Self, Time, Situation, Place  O2 (2-3L) Incontinent Weight: 147 lb 7.8 oz (66.9 kg) Height:  '5\' 6"'$  (167.6 cm)  BEHAVIORAL SYMPTOMS/MOOD NEUROLOGICAL BOWEL NUTRITION STATUS      Continent Diet (Cardiac Diet)  AMBULATORY STATUS COMMUNICATION OF NEEDS Skin   Supervision (Uses walker) Verbally Normal                       Personal Care Assistance Level of Assistance  Bathing, Feeding, Dressing Bathing Assistance: Limited assistance Feeding assistance: Independent Dressing Assistance: Limited assistance     Functional Limitations Info  Sight, Hearing,  Speech Sight Info: Adequate Hearing Info: Adequate Speech Info: Adequate    SPECIAL CARE FACTORS FREQUENCY  PT (By licensed PT)     PT Frequency: HHPT              Contractures Contractures Info: Not present    Additional Factors Info  Code Status, Allergies, Insulin Sliding Scale Code Status Info: DNR Allergies Info: Amoxicillin-pot Clavulanate, Latex, Other   Insulin Sliding Scale Info: 4 times a day, with meals and at bed time       Current Medications (06/26/2015):  This is the current hospital active medication list Current Facility-Administered Medications  Medication Dose Route Frequency Provider Last Rate Last Dose  . acetaminophen (TYLENOL) tablet 650 mg  650 mg Oral Q6H PRN [name], MD       Or  . acetaminophen (TYLENOL) suppository 650 mg  650 mg Rectal Q6H PRN [name], MD      . ALPRAZolam Duanne Moron) tablet 0.25 mg  0.25 mg Oral TID PRN [name], MD   0.25 mg at 06/26/15 1040  . alum & mag hydroxide-simeth (MAALOX/MYLANTA) 200-200-20 MG/5ML suspension 30 mL  30 mL Oral Q6H PRN [name], MD   30 mL at 06/22/15 1553  . antiseptic oral rinse (CPC / CETYLPYRIDINIUM CHLORIDE 0.05%) solution 7 mL  7 mL Mouth Rinse BID [name], MD   7 mL at 06/26/15 0907  . bisacodyl (DULCOLAX) suppository 10 mg  10 mg Rectal Once Colleen Can, MD   10  mg at 06/25/15 1318  . bisacodyl (DULCOLAX) suppository 10 mg  10 mg Rectal Daily PRN Colleen Can, MD   10 mg at 06/25/15 1313  . dabigatran (PRADAXA) capsule 150 mg  150 mg Oral Q12H [name], MD   150 mg at 06/26/15 0905  . feeding supplement (GLUCERNA SHAKE) (GLUCERNA SHAKE) liquid 237 mL  237 mL Oral TID BM [name], MD   237 mL at 06/26/15 0907  . fentaNYL (DURAGESIC - dosed mcg/hr) 50 mcg  50 mcg Transdermal Q72H Colleen Can, MD   50 mcg at 06/25/15 1551  . gabapentin (NEURONTIN) capsule 100 mg  100 mg Oral 2 times per day [name], MD   100 mg at 06/26/15  0905  . gabapentin (NEURONTIN) capsule 200 mg  200 mg Oral QHS [name], MD   200 mg at 06/25/15 2107  . ibuprofen (ADVIL,MOTRIN) tablet 400 mg  400 mg Oral Q6H PRN Nicholes Mango, MD      . insulin aspart (novoLOG) injection 0-5 Units  0-5 Units Subcutaneous QHS [name], MD   0 Units at 06/25/15 2058  . insulin aspart (novoLOG) injection 0-9 Units  0-9 Units Subcutaneous TID WC [name], MD   3 Units at 06/26/15 1213  . levofloxacin (LEVAQUIN) tablet 750 mg  750 mg Oral QAC breakfast Lenis Noon, RPH   750 mg at 06/26/15 5573  . levothyroxine (SYNTHROID, LEVOTHROID) tablet 25 mcg  25 mcg Oral QAC breakfast [name], MD   25 mcg at 06/26/15 0905  . ondansetron (ZOFRAN) tablet 4 mg  4 mg Oral Q6H PRN [name], MD   4 mg at 06/26/15 1040   Or  . ondansetron (ZOFRAN) injection 4 mg  4 mg Intravenous Q6H PRN [name], MD      . oxyCODONE (Oxy IR/ROXICODONE) immediate release tablet 15 mg  15 mg Oral Q4H PRN [name], MD   15 mg at 06/26/15 1423  . oxyCODONE (Oxy IR/ROXICODONE) immediate release tablet 15 mg  15 mg Oral Q6H Colleen Can, MD   15 mg at 06/26/15 1215  . prochlorperazine (COMPAZINE) suppository 25 mg  25 mg Rectal Q8H PRN Colleen Can, MD      . senna-docusate (Senokot-S) tablet 2 tablet  2 tablet Oral BID Colleen Can, MD   2 tablet at 06/26/15 [phone]  . tuberculin injection 5 Units  5 Units Intradermal Once [name], MD   5 Units at 06/24/15 1547   Facility-Administered Medications Ordered in Other Encounters  Medication Dose Route Frequency Provider Last Rate Last Dose  . heparin lock flush 100 unit/mL  500 Units Intravenous Once Forest Gleason, MD   500 Units at 11/20/14 1116  . sodium chloride 0.9 % injection 10 mL  10 mL Intravenous PRN Forest Gleason, MD   10 mL at 11/20/14 0916  . sodium chloride 0.9 % injection 10 mL  10 mL Intravenous PRN Forest Gleason, MD   10 mL at 01/21/15 0915     Discharge  Medications: Please see discharge summary for a list of discharge medications.  Current Discharge Medication List    START taking these medications   Details  fentaNYL (DURAGESIC - DOSED MCG/HR) 50 MCG/HR Place 1 patch (50 mcg total) onto the skin every 3 (three) days. Qty: 10 patch, Refills: 0    levofloxacin (LEVAQUIN) 750 MG tablet Take 1 tablet (750 mg total) by  mouth daily before breakfast. Qty: 5 tablet, Refills: 0      CONTINUE these medications which have CHANGED   Details  ALPRAZolam (XANAX) 0.5 MG tablet Take 1 tablet (0.5 mg total) by mouth daily as needed for anxiety. Qty: 30 tablet, Refills: 0    insulin lispro (HUMALOG) 100 UNIT/ML KiwkPen Inject 0.04-0.12 mLs (4-12 Units total) into the skin 4 (four) times daily - with meals and at bedtime. < 150 BS - no insulin, > 151-200 - give 4 units, 201-250 - given 6 units, 251-300 - given 8 units, 301-350 - give 10 units, 351-400 - give 12 units. Call MD for BS > 400. Qty: 15 mL, Refills: 11    oxyCODONE (ROXICODONE) 15 MG immediate release tablet Take 1 tablet (15 mg total) by mouth every 6 (six) hours. Qty: 30 tablet, Refills: 0      CONTINUE these medications which have NOT CHANGED   Details  acetaminophen (TYLENOL) 325 MG tablet Take 650 mg by mouth every 6 (six) hours as needed for moderate pain, fever or headache.    albuterol (PROAIR HFA) 108 (90 BASE) MCG/ACT inhaler Inhale 2 puffs into the lungs every 6 (six) hours as needed for wheezing or shortness of breath.    Associated Diagnoses: Small cell carcinoma of lung, unspecified laterality (HCC)    cyclobenzaprine (FLEXERIL) 5 MG tablet Take 5 mg by mouth daily as needed for muscle spasms.     dabigatran (PRADAXA) 150 MG CAPS capsule TAKE ONE CAPSULE BY MOUTH TWICE DAILY    diphenoxylate-atropine (LOMOTIL) 2.5-0.025 MG tablet Take 1 tablet by mouth 4 (four) times daily as needed for diarrhea or loose stools. Qty: 30  tablet, Refills: 0    gabapentin (NEURONTIN) 100 MG capsule Take 100 mg by mouth See admin instructions. Take 1 capsule in the morning and 2pm. Take 2 capsules ('200mg'$ ) by mouth every night at bedtime.    glipiZIDE (GLUCOTROL) 10 MG tablet Take 10 mg by mouth See admin instructions. Take 1 tablet by mouth every morning and take 1/2 tablet ('5mg'$ ) by mouth in the evening. Refills: 0    levothyroxine (SYNTHROID, LEVOTHROID) 25 MCG tablet Take 25 mcg by mouth daily before breakfast.     lidocaine-prilocaine (EMLA) cream Apply 1 application topically as needed. Qty: 30 g, Refills: 3   Associated Diagnoses: Mass of lung; Cancer of upper lobe of right lung (HCC)    Magnesium-Calcium-Folic Acid 889-169-4 MG TABS Take 1 tablet by mouth daily.     metoprolol-hydrochlorothiazide (LOPRESSOR HCT) 50-25 MG per tablet Take 1 tablet by mouth daily.     ondansetron (ZOFRAN) 4 MG tablet Take 1 tablet (4 mg total) by mouth every 6 (six) hours as needed. Qty: 30 tablet, Refills: 3   Associated Diagnoses: Cancer of upper lobe of right lung (HCC)    promethazine (PHENERGAN) 25 MG tablet TAKE ONE TABLET BY MOUTH EVERY SIX HOURSAS NEEDED FOR NAUSEA OR VOMITING Qty: 30 tablet, Refills: 0    RABEprazole (ACIPHEX) 20 MG tablet Take 20 mg by mouth daily.     senna-docusate (SENNA S) 8.6-50 MG tablet Take 1 tablet by mouth 2 (two) times daily. Qty: 60 tablet, Refills: 3   Associated Diagnoses: Cancer of upper lobe of right lung (HCC)    simvastatin (ZOCOR) 20 MG tablet Take 20 mg by mouth every evening.     isosorbide mononitrate (IMDUR) 60 MG 24 hr tablet Take 60 mg by mouth daily.     nitroGLYCERIN (NITROSTAT) 0.4 MG SL tablet  Place 0.4 mg under the tongue every 5 (five) minutes as needed for chest pain.     potassium chloride (K-DUR) 10 MEQ tablet Take 2 tablets (20 mEq total) by mouth daily. Qty: 60 tablet, Refills: 0      STOP taking these medications      aspirin 81 MG tablet      fentaNYL (DURAGESIC - DOSED MCG/HR) 25 MCG/HR patch      metFORMIN (GLUCOPHAGE) 1000 MG tablet      furosemide (LASIX) 40 MG tablet      Liraglutide 18 MG/3ML SOPN      magic mouthwash w/lidocaine SOLN         Relevant Imaging Results:  Relevant Lab Results:   Additional Information SSN:  [id number]   Pt will be followed by [facility] [place] [place]

## 2015-06-26 NOTE — Progress Notes (Signed)
Pt is being discharged to [facility], paperwork reviewed with patient, O2 delivered. [facility]ms was notified of needing transport. Awaiting their arrival.

## 2015-06-27 ENCOUNTER — Telehealth: Payer: Self-pay | Admitting: *Deleted

## 2015-06-27 LAB — CULTURE, BLOOD (ROUTINE X 2)
CULTURE: NO GROWTH
Culture: NO GROWTH

## 2015-06-27 NOTE — Telephone Encounter (Signed)
Was discharged from hospital and her meds are not right. She is on potassium, but not her lasix and she needs it, Her pain is not well controlled on Fentanyl 50 and Oxycodone 15 mg q 6 h prn

## 2015-06-27 NOTE — Telephone Encounter (Signed)
Wendy Giles will fax orders needed over for md signature and we can fax them to [facility] once signed

## 2015-07-01 ENCOUNTER — Other Ambulatory Visit: Payer: Self-pay | Admitting: *Deleted

## 2015-07-01 MED ORDER — FENTANYL 75 MCG/HR TD PT72: 75.0000 ug | MEDICATED_PATCH | TRANSDERMAL | Status: DC

## 2015-07-01 NOTE — Telephone Encounter (Signed)
faxed

## 2015-07-03 ENCOUNTER — Inpatient Hospital Stay: Payer: Medicare Other | Admitting: Oncology

## 2015-08-25 ENCOUNTER — Emergency Department
Admission: EM | Admit: 2015-08-25 | Discharge: 2015-08-26 | Disposition: A | Discharge status: Skilled Nursing Facility | Attending: Emergency Medicine | Admitting: Emergency Medicine

## 2015-08-25 ENCOUNTER — Encounter: Payer: Self-pay | Admitting: Emergency Medicine

## 2015-08-25 ENCOUNTER — Emergency Department

## 2015-08-25 DIAGNOSIS — I2510 Atherosclerotic heart disease of native coronary artery without angina pectoris: Secondary | ICD-10-CM | POA: Diagnosis not present

## 2015-08-25 DIAGNOSIS — M545 Low back pain: Secondary | ICD-10-CM | POA: Insufficient documentation

## 2015-08-25 DIAGNOSIS — J449 Chronic obstructive pulmonary disease, unspecified: Secondary | ICD-10-CM | POA: Diagnosis not present

## 2015-08-25 DIAGNOSIS — E119 Type 2 diabetes mellitus without complications: Secondary | ICD-10-CM | POA: Insufficient documentation

## 2015-08-25 DIAGNOSIS — I509 Heart failure, unspecified: Secondary | ICD-10-CM | POA: Insufficient documentation

## 2015-08-25 DIAGNOSIS — I480 Paroxysmal atrial fibrillation: Secondary | ICD-10-CM | POA: Diagnosis not present

## 2015-08-25 DIAGNOSIS — Z794 Long term (current) use of insulin: Secondary | ICD-10-CM | POA: Insufficient documentation

## 2015-08-25 DIAGNOSIS — M1990 Unspecified osteoarthritis, unspecified site: Secondary | ICD-10-CM | POA: Diagnosis not present

## 2015-08-25 DIAGNOSIS — Z96653 Presence of artificial knee joint, bilateral: Secondary | ICD-10-CM | POA: Insufficient documentation

## 2015-08-25 DIAGNOSIS — Z8679 Personal history of other diseases of the circulatory system: Secondary | ICD-10-CM | POA: Diagnosis not present

## 2015-08-25 DIAGNOSIS — Z950 Presence of cardiac pacemaker: Secondary | ICD-10-CM | POA: Diagnosis not present

## 2015-08-25 DIAGNOSIS — F329 Major depressive disorder, single episode, unspecified: Secondary | ICD-10-CM | POA: Insufficient documentation

## 2015-08-25 DIAGNOSIS — Z853 Personal history of malignant neoplasm of breast: Secondary | ICD-10-CM | POA: Insufficient documentation

## 2015-08-25 DIAGNOSIS — I110 Hypertensive heart disease with heart failure: Secondary | ICD-10-CM | POA: Insufficient documentation

## 2015-08-25 DIAGNOSIS — R52 Pain, unspecified: Secondary | ICD-10-CM

## 2015-08-25 DIAGNOSIS — Z85118 Personal history of other malignant neoplasm of bronchus and lung: Secondary | ICD-10-CM | POA: Diagnosis not present

## 2015-08-25 DIAGNOSIS — Z87891 Personal history of nicotine dependence: Secondary | ICD-10-CM | POA: Diagnosis not present

## 2015-08-25 DIAGNOSIS — E43 Unspecified severe protein-calorie malnutrition: Secondary | ICD-10-CM | POA: Insufficient documentation

## 2015-08-25 DIAGNOSIS — Z951 Presence of aortocoronary bypass graft: Secondary | ICD-10-CM | POA: Diagnosis not present

## 2015-08-25 DIAGNOSIS — Z7984 Long term (current) use of oral hypoglycemic drugs: Secondary | ICD-10-CM | POA: Diagnosis not present

## 2015-08-25 LAB — BASIC METABOLIC PANEL
ANION GAP: 6 (ref 5–15)
BUN: 31 mg/dL — ABNORMAL HIGH (ref 6–20)
CALCIUM: 9.3 mg/dL (ref 8.9–10.3)
CHLORIDE: 95 mmol/L — AB (ref 101–111)
CO2: 37 mmol/L — AB (ref 22–32)
Creatinine, Ser: 1.3 mg/dL — ABNORMAL HIGH (ref 0.44–1.00)
GFR calc non Af Amer: 38 mL/min — ABNORMAL LOW (ref >60)
GFR, EST AFRICAN AMERICAN: 44 mL/min — AB (ref >60)
GLUCOSE: 59 mg/dL — AB (ref 65–99)
POTASSIUM: 3.8 mmol/L (ref 3.5–5.1)
Sodium: 138 mmol/L (ref 135–145)

## 2015-08-25 LAB — CBC WITH DIFFERENTIAL/PLATELET
BASOS PCT: 0 %
Basophils Absolute: 0 10*3/uL (ref 0–0.1)
Eosinophils Absolute: 0 10*3/uL (ref 0–0.7)
Eosinophils Relative: 0 %
HEMATOCRIT: 33.6 % — AB (ref 35.0–47.0)
HEMOGLOBIN: 10.9 g/dL — AB (ref 12.0–16.0)
LYMPHS PCT: 11 %
Lymphs Abs: 0.9 10*3/uL — ABNORMAL LOW (ref 1.0–3.6)
MCH: 28.8 pg (ref 26.0–34.0)
MCHC: 32.5 g/dL (ref 32.0–36.0)
MCV: 88.7 fL (ref 80.0–100.0)
MONOS PCT: 9 %
Monocytes Absolute: 0.8 10*3/uL (ref 0.2–0.9)
NEUTROS ABS: 6.8 10*3/uL — AB (ref 1.4–6.5)
NEUTROS PCT: 80 %
Platelets: 217 10*3/uL (ref 150–440)
RBC: 3.78 MIL/uL — ABNORMAL LOW (ref 3.80–5.20)
RDW: 18.7 % — ABNORMAL HIGH (ref 11.5–14.5)
WBC: 8.5 10*3/uL (ref 3.6–11.0)

## 2015-08-25 LAB — HEPATIC FUNCTION PANEL
ALBUMIN: 3.1 g/dL — AB (ref 3.5–5.0)
ALK PHOS: 297 U/L — AB (ref 38–126)
ALT: 36 U/L (ref 14–54)
AST: 94 U/L — AB (ref 15–41)
BILIRUBIN DIRECT: 0.2 mg/dL (ref 0.1–0.5)
BILIRUBIN TOTAL: 0.9 mg/dL (ref 0.3–1.2)
Indirect Bilirubin: 0.7 mg/dL (ref 0.3–0.9)
Total Protein: 6.5 g/dL (ref 6.5–8.1)

## 2015-08-25 MED ORDER — ONDANSETRON HCL 4 MG/2ML IJ SOLN
4.0000 mg | Freq: Once | INTRAMUSCULAR | Status: AC
  Administered 2015-08-25: 4 mg via INTRAVENOUS
  Filled 2015-08-25: qty 2

## 2015-08-25 MED ORDER — ONDANSETRON HCL 4 MG/2ML IJ SOLN
INTRAMUSCULAR | Status: AC
  Filled 2015-08-25: qty 2

## 2015-08-25 MED ORDER — MORPHINE SULFATE (PF) 4 MG/ML IV SOLN
4.0000 mg | Freq: Once | INTRAVENOUS | Status: AC
  Administered 2015-08-25: 4 mg via INTRAVENOUS

## 2015-08-25 MED ORDER — IOPAMIDOL (ISOVUE-370) INJECTION 76%
60.0000 mL | Freq: Once | INTRAVENOUS | Status: AC | PRN
  Administered 2015-08-25: 60 mL via INTRAVENOUS

## 2015-08-25 MED ORDER — SODIUM CHLORIDE 0.9 % IV SOLN
Freq: Once | INTRAVENOUS | Status: AC
  Administered 2015-08-25: 500 mL/h via INTRAVENOUS

## 2015-08-25 MED ORDER — MORPHINE SULFATE (PF) 4 MG/ML IV SOLN
INTRAVENOUS | Status: AC
  Filled 2015-08-25: qty 1

## 2015-08-25 MED ORDER — ACETAMINOPHEN 10 MG/ML IV SOLN
1000.0000 mg | Freq: Four times a day (QID) | INTRAVENOUS | Status: DC
  Administered 2015-08-25: 1000 mg via INTRAVENOUS
  Filled 2015-08-25 (×4): qty 100

## 2015-08-25 MED ORDER — MORPHINE SULFATE (PF) 4 MG/ML IV SOLN
4.0000 mg | Freq: Once | INTRAVENOUS | Status: AC
  Administered 2015-08-25: 4 mg via INTRAVENOUS
  Filled 2015-08-25: qty 1

## 2015-08-25 MED ORDER — ONDANSETRON HCL 4 MG/2ML IJ SOLN
4.0000 mg | Freq: Once | INTRAMUSCULAR | Status: AC
  Administered 2015-08-25: 4 mg via INTRAVENOUS

## 2015-08-25 NOTE — Discharge Instructions (Signed)
Please contact her doctor tomorrow for further instructions in the meantime we will increase the morphine sulfate solution to 1 cc every hour as needed. Please return for any further problems

## 2015-08-25 NOTE — ED Notes (Signed)
[facility] reports that pt comes from [facility]. Pt reports pain 5/10 that is decreased at this time. Pt reports that she is a hospice pt with DNR with her at this time. Pt reports normal level of pain is 2/10. Pt came in d/t unreleaved pain in her back and facility reports that they have reached max level of pain medication. Pt has hx of lung cancer with EMS vitals of 127/65, O2 saturation of 99% on 3L, and pulse of 60. Pt is a/o with NAD at this time.

## 2015-08-25 NOTE — ED Provider Notes (Addendum)
[facility] Emergency Department Provider Note  ____________________________________________  Time seen: Approximately 7:26 PM  I have reviewed the triage vital signs and the nursing notes.   HISTORY  Chief Complaint Back Pain   HPI Wendy Giles is a 82 y.o. female patient reports onset of pain right side of the back between the spine and shoulder blade about 2:00 this afternoon. Patient reports it hurts worse when she takes a deep breath. She has had good number for good amount of pain medicine in fact all the pain medicine she is allowed to have at the care home and so came here because the pain was severe. She also reports some swelling in the right leg which is not normally present. Patient denies any fever. Patient is somewhat short of breath. Patient's blood pressures 83 systolic we will give her some fluids in attempt to raise her blood pressures give her some more pain medicine. The meantime I will give her some IV Tylenol for pain.  Past Medical History  Diagnosis Date  . CHF (congestive heart failure) ([place])   . Diabetes mellitus without complication ([place])   . Hypertension   . Presence of permanent cardiac pacemaker   . Dysrhythmia     afib  . Depression   . GERD (gastroesophageal reflux disease)   . Coronary artery disease   . Cancer ([place])     breast,uterine  . COPD (chronic obstructive pulmonary disease) ([place])   . Arthritis   . Anemia   . Anginal pain ([place])   . Cancer of upper lobe of right lung ([place]) 11/03/2014  . Breast cancer [facility])     Patient Active Problem List   Diagnosis Date Noted  . Hypoglycemia   . Protein-calorie malnutrition, severe 06/24/2015  . Sepsis ([place]) 06/22/2015  . Cancer of upper lobe of right lung ([place]) 11/03/2014  . History of biliary T-tube placement 10/29/2014  . Mass of lung   . Breath shortness 01/02/2014  . Artificial cardiac pacemaker 01/01/2014  . Diabetes ([place]) 01/01/2014  . H/O cardiac  catheterization 01/01/2014  . History of cardioversion 01/01/2014  . S/P coronary artery balloon dilation 01/01/2014  . Paroxysmal atrial fibrillation ([place]) 01/01/2014  . Diabetes mellitus ([place]) 01/01/2014    Past Surgical History  Procedure Laterality Date  . Cholecystectomy    . Coronary angioplasty    . Coronary artery bypass graft    . Appendectomy    . Mastectomy    . Ablation of dysrhythmic focus    . Cardioversion    . Joint replacement      tkr  . Insert / replace / remove pacemaker    . Peripheral vascular catheterization N/A 11/05/2014    Procedure: Glori Luis Cath Insertion;  Surgeon: Algernon Huxley, MD;  Location: [place] [facility];  Service: Cardiovascular;  Laterality: N/A;  . Endobronchial ultrasound N/A 10/24/2014    Procedure: ENDOBRONCHIAL ULTRASOUND;  Surgeon: Vilinda Boehringer, MD;  Location: [facility] ORS;  Service: Cardiopulmonary;  Laterality: N/A;    Current Outpatient Rx  Name  Route  Sig  Dispense  Refill  . acetaminophen (TYLENOL) 325 MG tablet   Oral   Take 650 mg by mouth every 6 (six) hours as needed for moderate pain, fever or headache.         . albuterol (PROAIR HFA) 108 (90 BASE) MCG/ACT inhaler   Inhalation   Inhale 2 puffs into the lungs every 6 (six) hours as needed for wheezing or shortness of breath.          Marland Kitchen  ALPRAZolam (XANAX) 0.5 MG tablet   Oral   Take 1 tablet (0.5 mg total) by mouth daily as needed for anxiety.   30 tablet   0   . cyclobenzaprine (FLEXERIL) 5 MG tablet   Oral   Take 5 mg by mouth daily as needed for muscle spasms.          . dabigatran (PRADAXA) 150 MG CAPS capsule      TAKE ONE CAPSULE BY MOUTH TWICE DAILY         . diphenoxylate-atropine (LOMOTIL) 2.5-0.025 MG tablet   Oral   Take 1 tablet by mouth 4 (four) times daily as needed for diarrhea or loose stools.   30 tablet   0   . fentaNYL (DURAGESIC - DOSED MCG/HR) 75 MCG/HR   Transdermal   Place 1 patch (75 mcg total) onto the skin every 3 (three) days.    5 patch   0     HOSPICE PATIENT   . gabapentin (NEURONTIN) 100 MG capsule   Oral   Take 100 mg by mouth See admin instructions. Take 1 capsule in the morning and 2pm. Take 2 capsules ('200mg'$ ) by mouth every night at bedtime.         Marland Kitchen glipiZIDE (GLUCOTROL) 10 MG tablet   Oral   Take 10 mg by mouth See admin instructions. Take 1 tablet by mouth every morning and take 1/2 tablet ('5mg'$ ) by mouth in the evening.      0   . insulin lispro (HUMALOG) 100 UNIT/ML KiwkPen   Subcutaneous   Inject 0.04-0.12 mLs (4-12 Units total) into the skin 4 (four) times daily -  with meals and at bedtime. < 150 BS - no insulin, > 151-200 - give 4 units, 201-250 - given 6 units, 251-300 - given 8 units, 301-350 - give 10 units, 351-400 - give 12 units.  Call MD for BS > 400.   15 mL   11   . isosorbide mononitrate (IMDUR) 60 MG 24 hr tablet   Oral   Take 60 mg by mouth daily.          Marland Kitchen levofloxacin (LEVAQUIN) 750 MG tablet   Oral   Take 1 tablet (750 mg total) by mouth daily before breakfast.   5 tablet   0   . levothyroxine (SYNTHROID, LEVOTHROID) 25 MCG tablet   Oral   Take 25 mcg by mouth daily before breakfast.          . lidocaine-prilocaine (EMLA) cream   Topical   Apply 1 application topically as needed.   30 g   3   . Magnesium-Calcium-Folic Acid 341-937-9 MG TABS   Oral   Take 1 tablet by mouth daily.          . metoprolol-hydrochlorothiazide (LOPRESSOR HCT) 50-25 MG per tablet   Oral   Take 1 tablet by mouth daily.          Marland Kitchen EXPIRED: nitroGLYCERIN (NITROSTAT) 0.4 MG SL tablet   Sublingual   Place 0.4 mg under the tongue every 5 (five) minutes as needed for chest pain.          Marland Kitchen ondansetron (ZOFRAN) 4 MG tablet   Oral   Take 1 tablet (4 mg total) by mouth every 6 (six) hours as needed.   30 tablet   3   . oxyCODONE (ROXICODONE) 15 MG immediate release tablet   Oral   Take 1 tablet (15 mg total) by mouth every 6 (six) hours.  30 tablet   0   . potassium  chloride (K-DUR) 10 MEQ tablet   Oral   Take 2 tablets (20 mEq total) by mouth daily.   60 tablet   0   . promethazine (PHENERGAN) 25 MG tablet      TAKE ONE TABLET BY MOUTH EVERY SIX HOURSAS NEEDED FOR NAUSEA OR VOMITING   30 tablet   0   . RABEprazole (ACIPHEX) 20 MG tablet   Oral   Take 20 mg by mouth daily.          Marland Kitchen senna-docusate (SENNA S) 8.6-50 MG tablet   Oral   Take 1 tablet by mouth 2 (two) times daily.   60 tablet   3   . simvastatin (ZOCOR) 20 MG tablet   Oral   Take 20 mg by mouth every evening.            Allergies Amoxicillin-pot clavulanate; Latex; and Other  Family History  Problem Relation Age of Onset  . CVA Mother   . Cancer Father     Social History Social History  Substance Use Topics  . Smoking status: Former Smoker -- 1.00 packs/day for 18 years    Types: Cigarettes    Quit date: 10/21/1997  . Smokeless tobacco: Never Used  . Alcohol Use: No    Review of Systems Constitutional: No fever/chills Eyes: No visual changes. ENT: No sore throat. Cardiovascular:  chest pain. Respiratory:shortness of breath. Gastrointestinal: No abdominal pain.  No nausea, no vomiting.  No diarrhea.  No constipation. Genitourinary: Negative for dysuria. Musculoskeletal: Negative for back pain. Skin: Negative for rash. Neurological: Negative for headaches, focal weakness or numbness.  10-point ROS otherwise negative.  ____________________________________________   PHYSICAL EXAM:  VITAL SIGNS: ED Triage Vitals  Enc Vitals Group     BP 08/25/15 1920 130/68 mmHg     Pulse Rate 08/25/15 1920 65     Resp 08/25/15 1920 18     Temp 08/25/15 1920 98.2 F (36.8 C)     Temp Source 08/25/15 1920 Oral     SpO2 08/25/15 1920 99 %     Weight 08/25/15 1920 110 lb (49.896 kg)     Height 08/25/15 1920 '5\' 2"'$  (1.575 m)     Head Cir --      Peak Flow --      Pain Score 08/25/15 1923 5     Pain Loc --      Pain Edu? --      Excl. in [place]? --      Constitutional: Alert and oriented. Well appearing and in no acute distress. Eyes: Conjunctivae are normal. PERRL. EOMI. Head: Atraumatic. Nose: No congestion/rhinnorhea. Mouth/Throat: Mucous membranes are moist.  Oropharynx non-erythematous. Neck: No stridor. Cardiovascular: Normal rate, regular rhythm. Grossly normal heart sounds.  Good peripheral circulation. Respiratory: Normal respiratory effort.  No retractions. Lungs CTAB. There is some pain to palpation in the area described in H&P. Gastrointestinal: Soft and nontender. No distention. No abdominal bruits. No CVA tenderness. Musculoskeletal: No lower extremity tenderness nor edema there is however some swelling in the right leg.Marland Kitchen  No joint effusions. Neurologic:  Normal speech and language. No gross focal neurologic deficits are appreciated. No gait instability. Skin:  Skin is warm, dry and intact. No rash noted. Psychiatric: Mood and affect are normal. Speech and behavior are normal.  ____________________________________________   LABS (all labs ordered are listed, but only abnormal results are displayed)  Labs Reviewed  BASIC METABOLIC PANEL - Abnormal;  Notable for the following:    Chloride 95 (*)    CO2 37 (*)    Glucose, Bld 59 (*)    BUN 31 (*)    Creatinine, Ser 1.30 (*)    GFR calc non Af Amer 38 (*)    GFR calc Af Amer 44 (*)    All other components within normal limits  CBC WITH DIFFERENTIAL/PLATELET - Abnormal; Notable for the following:    RBC 3.78 (*)    Hemoglobin 10.9 (*)    HCT 33.6 (*)    RDW 18.7 (*)    Neutro Abs 6.8 (*)    Lymphs Abs 0.9 (*)    All other components within normal limits  HEPATIC FUNCTION PANEL - Abnormal; Notable for the following:    Albumin 3.1 (*)    AST 94 (*)    Alkaline Phosphatase 297 (*)    All other components within normal limits   ____________________________________________  EKG Interpreted by me EKG fully paced rhythm no acute changes are  seen ____________________________________________  RADIOLOGY  MPRESSION: No evidence of significant pulmonary embolus.  Progression of primary and metastatic disease in the lungs with apparent endobronchial obstructing lesion in the right hilum causing collapse and consolidation throughout the right lung. Increasing left pulmonary nodules. Moderate right pleural effusion. Increasing mediastinal and hilar lymphadenopathy. Increasing bilateral adrenal gland nodules. Previously demonstrated liver lesions less well depicted on today's study due to technique.   Electronically Signed  By: Lucienne Capers M.D.  On: 08/25/2015 21:19  ____________________________________________   PROCEDURES  Stress with Vipel Shaw hospitalist to review the chart he would not recommends increasing morphine sulfate from half a milligram to 1 mg every hour when necessary  ____________________________________________   INITIAL IMPRESSION / ASSESSMENT AND PLAN / ED COURSE  Pertinent labs & imaging results that were available during my care of the patient were reviewed by me and considered in my medical decision making (see chart for details).   ____________________________________________   FINAL CLINICAL IMPRESSION(S) / ED DIAGNOSES  Final diagnoses:  Pain      Nena Polio, MD 08/26/15 [id number]  Nena Polio, MD 09/05/15 [phone]

## 2015-08-29 ENCOUNTER — Inpatient Hospital Stay
Admission: EM | Admit: 2015-08-29 | Discharge: 2015-08-30 | DRG: 637 | Disposition: A | Discharge status: Hospice | Attending: Internal Medicine | Admitting: Internal Medicine

## 2015-08-29 ENCOUNTER — Emergency Department

## 2015-08-29 DIAGNOSIS — Z950 Presence of cardiac pacemaker: Secondary | ICD-10-CM

## 2015-08-29 DIAGNOSIS — Z96659 Presence of unspecified artificial knee joint: Secondary | ICD-10-CM | POA: Diagnosis present

## 2015-08-29 DIAGNOSIS — I110 Hypertensive heart disease with heart failure: Secondary | ICD-10-CM | POA: Diagnosis present

## 2015-08-29 DIAGNOSIS — Z809 Family history of malignant neoplasm, unspecified: Secondary | ICD-10-CM

## 2015-08-29 DIAGNOSIS — Z951 Presence of aortocoronary bypass graft: Secondary | ICD-10-CM | POA: Diagnosis not present

## 2015-08-29 DIAGNOSIS — I4891 Unspecified atrial fibrillation: Secondary | ICD-10-CM | POA: Diagnosis present

## 2015-08-29 DIAGNOSIS — I2510 Atherosclerotic heart disease of native coronary artery without angina pectoris: Secondary | ICD-10-CM | POA: Diagnosis present

## 2015-08-29 DIAGNOSIS — E162 Hypoglycemia, unspecified: Secondary | ICD-10-CM | POA: Diagnosis present

## 2015-08-29 DIAGNOSIS — Z9861 Coronary angioplasty status: Secondary | ICD-10-CM

## 2015-08-29 DIAGNOSIS — Z87891 Personal history of nicotine dependence: Secondary | ICD-10-CM

## 2015-08-29 DIAGNOSIS — Z9049 Acquired absence of other specified parts of digestive tract: Secondary | ICD-10-CM | POA: Diagnosis not present

## 2015-08-29 DIAGNOSIS — Z888 Allergy status to other drugs, medicaments and biological substances status: Secondary | ICD-10-CM | POA: Diagnosis not present

## 2015-08-29 DIAGNOSIS — R410 Disorientation, unspecified: Secondary | ICD-10-CM

## 2015-08-29 DIAGNOSIS — Z85118 Personal history of other malignant neoplasm of bronchus and lung: Secondary | ICD-10-CM | POA: Diagnosis not present

## 2015-08-29 DIAGNOSIS — E860 Dehydration: Secondary | ICD-10-CM | POA: Diagnosis present

## 2015-08-29 DIAGNOSIS — J449 Chronic obstructive pulmonary disease, unspecified: Secondary | ICD-10-CM | POA: Diagnosis present

## 2015-08-29 DIAGNOSIS — I509 Heart failure, unspecified: Secondary | ICD-10-CM | POA: Diagnosis present

## 2015-08-29 DIAGNOSIS — N179 Acute kidney failure, unspecified: Secondary | ICD-10-CM | POA: Diagnosis present

## 2015-08-29 DIAGNOSIS — Z66 Do not resuscitate: Secondary | ICD-10-CM | POA: Diagnosis present

## 2015-08-29 DIAGNOSIS — Z9010 Acquired absence of unspecified breast and nipple: Secondary | ICD-10-CM | POA: Diagnosis not present

## 2015-08-29 DIAGNOSIS — Z9889 Other specified postprocedural states: Secondary | ICD-10-CM | POA: Diagnosis not present

## 2015-08-29 DIAGNOSIS — Z7901 Long term (current) use of anticoagulants: Secondary | ICD-10-CM | POA: Diagnosis not present

## 2015-08-29 DIAGNOSIS — Z91040 Latex allergy status: Secondary | ICD-10-CM | POA: Diagnosis not present

## 2015-08-29 DIAGNOSIS — Z880 Allergy status to penicillin: Secondary | ICD-10-CM

## 2015-08-29 DIAGNOSIS — Z515 Encounter for palliative care: Secondary | ICD-10-CM | POA: Diagnosis present

## 2015-08-29 DIAGNOSIS — C55 Malignant neoplasm of uterus, part unspecified: Secondary | ICD-10-CM | POA: Diagnosis present

## 2015-08-29 DIAGNOSIS — J9620 Acute and chronic respiratory failure, unspecified whether with hypoxia or hypercapnia: Secondary | ICD-10-CM | POA: Diagnosis present

## 2015-08-29 DIAGNOSIS — M1990 Unspecified osteoarthritis, unspecified site: Secondary | ICD-10-CM | POA: Diagnosis present

## 2015-08-29 DIAGNOSIS — E11649 Type 2 diabetes mellitus with hypoglycemia without coma: Principal | ICD-10-CM | POA: Diagnosis present

## 2015-08-29 DIAGNOSIS — Z79899 Other long term (current) drug therapy: Secondary | ICD-10-CM

## 2015-08-29 DIAGNOSIS — K219 Gastro-esophageal reflux disease without esophagitis: Secondary | ICD-10-CM | POA: Diagnosis present

## 2015-08-29 DIAGNOSIS — Z823 Family history of stroke: Secondary | ICD-10-CM

## 2015-08-29 LAB — CBC WITH DIFFERENTIAL/PLATELET
Basophils Absolute: 0 10*3/uL (ref 0–0.1)
Basophils Relative: 0 %
Eosinophils Absolute: 0.1 10*3/uL (ref 0–0.7)
Eosinophils Relative: 1 %
HCT: 34.4 % — ABNORMAL LOW (ref 35.0–47.0)
HEMOGLOBIN: 10.8 g/dL — AB (ref 12.0–16.0)
LYMPHS ABS: 0.9 10*3/uL — AB (ref 1.0–3.6)
LYMPHS PCT: 11 %
MCH: 28.4 pg (ref 26.0–34.0)
MCHC: 31.5 g/dL — ABNORMAL LOW (ref 32.0–36.0)
MCV: 90.3 fL (ref 80.0–100.0)
Monocytes Absolute: 0.7 10*3/uL (ref 0.2–0.9)
Monocytes Relative: 10 %
NEUTROS PCT: 78 %
Neutro Abs: 6 10*3/uL (ref 1.4–6.5)
Platelets: 167 10*3/uL (ref 150–440)
RBC: 3.8 MIL/uL (ref 3.80–5.20)
RDW: 19.6 % — ABNORMAL HIGH (ref 11.5–14.5)
WBC: 7.7 10*3/uL (ref 3.6–11.0)

## 2015-08-29 LAB — URINALYSIS COMPLETE WITH MICROSCOPIC (ARMC ONLY)
BACTERIA UA: NONE SEEN
Bilirubin Urine: NEGATIVE
Glucose, UA: NEGATIVE mg/dL
HGB URINE DIPSTICK: NEGATIVE
Ketones, ur: NEGATIVE mg/dL
LEUKOCYTES UA: NEGATIVE
Nitrite: NEGATIVE
PH: 6 (ref 5.0–8.0)
Protein, ur: 30 mg/dL — AB
SQUAMOUS EPITHELIAL / LPF: NONE SEEN
Specific Gravity, Urine: 1.025 (ref 1.005–1.030)

## 2015-08-29 LAB — BASIC METABOLIC PANEL
Anion gap: 5 (ref 5–15)
BUN: 34 mg/dL — AB (ref 6–20)
CHLORIDE: 98 mmol/L — AB (ref 101–111)
CO2: 38 mmol/L — ABNORMAL HIGH (ref 22–32)
Calcium: 9.1 mg/dL (ref 8.9–10.3)
Creatinine, Ser: 1.48 mg/dL — ABNORMAL HIGH (ref 0.44–1.00)
GFR calc non Af Amer: 32 mL/min — ABNORMAL LOW (ref >60)
GFR, EST AFRICAN AMERICAN: 37 mL/min — AB (ref >60)
Glucose, Bld: 81 mg/dL (ref 65–99)
POTASSIUM: 4.7 mmol/L (ref 3.5–5.1)
SODIUM: 141 mmol/L (ref 135–145)

## 2015-08-29 LAB — GLUCOSE, CAPILLARY
GLUCOSE-CAPILLARY: 68 mg/dL (ref 65–99)
GLUCOSE-CAPILLARY: 45 mg/dL — AB (ref 65–99)
GLUCOSE-CAPILLARY: 138 mg/dL — AB (ref 65–99)
Glucose-Capillary: 88 mg/dL (ref 65–99)

## 2015-08-29 LAB — TROPONIN I: Troponin I: <0.03 ng/mL (ref <0.031)

## 2015-08-29 MED ORDER — ALPRAZOLAM 0.5 MG PO TABS: 0.5000 mg | ORAL_TABLET | Freq: Every day | ORAL | Status: DC | PRN

## 2015-08-29 MED ORDER — DABIGATRAN ETEXILATE MESYLATE 150 MG PO CAPS: 150.0000 mg | ORAL_CAPSULE | Freq: Two times a day (BID) | ORAL | Status: DC

## 2015-08-29 MED ORDER — OXYCODONE HCL 5 MG PO TABS
15.0000 mg | ORAL_TABLET | Freq: Four times a day (QID) | ORAL | Status: DC
  Administered 2015-08-30 (×2): 15 mg via ORAL
  Filled 2015-08-29 (×2): qty 3

## 2015-08-29 MED ORDER — HYDROCHLOROTHIAZIDE 25 MG PO TABS
25.0000 mg | ORAL_TABLET | Freq: Every day | ORAL | Status: DC
  Filled 2015-08-29: qty 1

## 2015-08-29 MED ORDER — DEXTROSE 50 % IV SOLN
INTRAVENOUS | Status: AC
  Administered 2015-08-29: 50 mL
  Filled 2015-08-29: qty 50

## 2015-08-29 MED ORDER — GABAPENTIN 100 MG PO CAPS: 100.0000 mg | ORAL_CAPSULE | ORAL | Status: DC

## 2015-08-29 MED ORDER — ONDANSETRON HCL 4 MG PO TABS: 4.0000 mg | ORAL_TABLET | Freq: Three times a day (TID) | ORAL | Status: DC | PRN

## 2015-08-29 MED ORDER — FENTANYL 25 MCG/HR TD PT72: 75.0000 ug | MEDICATED_PATCH | TRANSDERMAL | Status: DC

## 2015-08-29 MED ORDER — CYCLOBENZAPRINE HCL 10 MG PO TABS: 5.0000 mg | ORAL_TABLET | Freq: Every day | ORAL | Status: DC | PRN

## 2015-08-29 MED ORDER — SENNOSIDES-DOCUSATE SODIUM 8.6-50 MG PO TABS
1.0000 | ORAL_TABLET | Freq: Two times a day (BID) | ORAL | Status: DC
  Administered 2015-08-30 (×2): 1 via ORAL
  Filled 2015-08-29 (×2): qty 1

## 2015-08-29 MED ORDER — FOLIC ACID 1 MG PO TABS: 1.0000 mg | ORAL_TABLET | Freq: Every day | ORAL | Status: DC

## 2015-08-29 MED ORDER — DABIGATRAN ETEXILATE MESYLATE 75 MG PO CAPS
75.0000 mg | ORAL_CAPSULE | Freq: Two times a day (BID) | ORAL | Status: DC
  Administered 2015-08-30 (×2): 75 mg via ORAL
  Filled 2015-08-29 (×4): qty 1

## 2015-08-29 MED ORDER — PANTOPRAZOLE SODIUM 40 MG PO TBEC
40.0000 mg | DELAYED_RELEASE_TABLET | Freq: Every day | ORAL | Status: DC
  Administered 2015-08-30: 40 mg via ORAL
  Filled 2015-08-29: qty 1

## 2015-08-29 MED ORDER — METOPROLOL TARTRATE 50 MG PO TABS
50.0000 mg | ORAL_TABLET | Freq: Every day | ORAL | Status: DC
  Filled 2015-08-29: qty 1

## 2015-08-29 MED ORDER — MAGNESIUM OXIDE 400 (241.3 MG) MG PO TABS: 400.0000 mg | ORAL_TABLET | Freq: Every day | ORAL | Status: DC

## 2015-08-29 MED ORDER — METOPROLOL-HYDROCHLOROTHIAZIDE 50-25 MG PO TABS: 1.0000 | ORAL_TABLET | Freq: Every day | ORAL | Status: DC

## 2015-08-29 MED ORDER — LEVOTHYROXINE SODIUM 25 MCG PO TABS
25.0000 ug | ORAL_TABLET | Freq: Every day | ORAL | Status: DC
  Administered 2015-08-30: 25 ug via ORAL
  Filled 2015-08-29: qty 1

## 2015-08-29 MED ORDER — CALCIUM CARBONATE ANTACID 500 MG PO CHEW: 1.0000 | CHEWABLE_TABLET | Freq: Every day | ORAL | Status: DC

## 2015-08-29 MED ORDER — ALBUTEROL SULFATE (2.5 MG/3ML) 0.083% IN NEBU: 3.0000 mL | INHALATION_SOLUTION | Freq: Four times a day (QID) | RESPIRATORY_TRACT | Status: DC | PRN

## 2015-08-29 MED ORDER — ISOSORBIDE MONONITRATE ER 30 MG PO TB24
60.0000 mg | ORAL_TABLET | Freq: Every day | ORAL | Status: DC
  Filled 2015-08-29: qty 2

## 2015-08-29 MED ORDER — SIMVASTATIN 20 MG PO TABS
20.0000 mg | ORAL_TABLET | Freq: Every evening | ORAL | Status: DC
  Administered 2015-08-30: 20 mg via ORAL
  Filled 2015-08-29: qty 1

## 2015-08-29 MED ORDER — ACETAMINOPHEN 325 MG PO TABS: 650.0000 mg | ORAL_TABLET | Freq: Four times a day (QID) | ORAL | Status: DC | PRN

## 2015-08-29 MED ORDER — DEXTROSE-NACL 5-0.9 % IV SOLN
INTRAVENOUS | Status: DC
  Administered 2015-08-29 – 2015-08-30 (×2): via INTRAVENOUS

## 2015-08-29 MED ORDER — MAGNESIUM-CALCIUM-FOLIC ACID 400-200-1 MG PO TABS: 1.0000 | ORAL_TABLET | Freq: Every day | ORAL | Status: DC

## 2015-08-29 NOTE — H&P (Signed)
[facility] [place] NAME: Wendy Giles    MR#:  [id number]  DATE OF BIRTH:  [birth year]  DATE OF ADMISSION:  08/29/2015  PRIMARY CARE PHYSICIAN: Tracie Harrier, MD   REQUESTING/REFERRING PHYSICIAN: Carrie Mew  CHIEF COMPLAINT:   Chief Complaint  Patient presents with  . Fall    HISTORY OF PRESENT ILLNESS: Wendy Giles  is a 82 y.o. female with a known history of CHF, diabetes, hypertension, this dysrhythmia, coronary artery disease, breast and uterine cancer, COPD, [facility], who was admitted in hospital last month with sepsis and hypoglycemia and also had one more visit to ER after that. For last 2-3 days she had decreased oral intake, and they were giving her pured diet. Today morning she was trying to get out of the bed and she fell down and stayed on the floor for a while until the staff noticed her, they called EMS and found her blood sugar level was low in ER she was noted to have blood sugar of 45 and she was given dextrose injection with that it came out. Patient denies any other complaints including cough, fever, urinary symptoms. On lab work in [place] she was noted to have worsening in the renal function.  PAST MEDICAL HISTORY:   Past Medical History  Diagnosis Date  . CHF (congestive heart failure) ([place])   . Diabetes mellitus without complication ([place])   . Hypertension   . Presence of permanent cardiac pacemaker   . Dysrhythmia     afib  . Depression   . GERD (gastroesophageal reflux disease)   . Coronary artery disease   . Cancer ([place])     breast,uterine  . COPD (chronic obstructive pulmonary disease) ([place])   . Arthritis   . Anemia   . Anginal pain ([place])   . Cancer of upper lobe of right lung ([place]) 11/03/2014  . Breast cancer ([place])     PAST SURGICAL HISTORY: Past Surgical History  Procedure Laterality Date  . Cholecystectomy    . Coronary angioplasty    . Coronary  artery bypass graft    . Appendectomy    . Mastectomy    . Ablation of dysrhythmic focus    . Cardioversion    . Joint replacement      tkr  . Insert / replace / remove pacemaker    . Peripheral vascular catheterization N/A 11/05/2014    Procedure: Glori Luis Cath Insertion;  Surgeon: Algernon Huxley, MD;  Location: [facility];  Service: Cardiovascular;  Laterality: N/A;  . Endobronchial ultrasound N/A 10/24/2014    Procedure: ENDOBRONCHIAL ULTRASOUND;  Surgeon: Vilinda Boehringer, MD;  Location: [facility];  Service: Cardiopulmonary;  Laterality: N/A;    SOCIAL HISTORY:  Social History  Substance Use Topics  . Smoking status: Former Smoker -- 1.00 packs/day for 18 years    Types: Cigarettes    Quit date: 10/21/1997  . Smokeless tobacco: Never Used  . Alcohol Use: No    FAMILY HISTORY:  Family History  Problem Relation Age of Onset  . CVA Mother   . Cancer Father     DRUG ALLERGIES:  Allergies  Allergen Reactions  . Amoxicillin-Pot Clavulanate Hives, Diarrhea and Other (See Comments)    Patient is unable to answer follow up questions and family in unaware of allergies  . Latex Rash  . Other Rash    REVIEW OF SYSTEMS:   CONSTITUTIONAL: No fever, fatigue or weakness.  EYES: No blurred or double vision.  EARS, NOSE, AND THROAT: No tinnitus or ear pain.  RESPIRATORY: No cough, shortness of breath, wheezing or hemoptysis.  CARDIOVASCULAR: No chest pain, orthopnea, edema.  GASTROINTESTINAL: No nausea, vomiting, diarrhea or abdominal pain.  GENITOURINARY: No dysuria, hematuria.  ENDOCRINE: No polyuria, nocturia,  HEMATOLOGY: No anemia, easy bruising or bleeding SKIN: No rash or lesion. MUSCULOSKELETAL: No joint pain or arthritis.   NEUROLOGIC: No tingling, numbness, have generalized weakness.  PSYCHIATRY: No anxiety or depression.   MEDICATIONS AT HOME:  Prior to Admission medications   Medication Sig Start Date End Date Taking? Authorizing Provider  acetaminophen  (TYLENOL) 325 MG tablet Take 650 mg by mouth every 6 (six) hours as needed for moderate pain, fever or headache.    Historical Provider, MD  albuterol (PROAIR HFA) 108 (90 BASE) MCG/ACT inhaler Inhale 2 puffs into the lungs every 6 (six) hours as needed for wheezing or shortness of breath.  10/29/14 10/29/15  Historical Provider, MD  ALPRAZolam Duanne Moron) 0.5 MG tablet Take 1 tablet (0.5 mg total) by mouth daily as needed for anxiety. 06/26/15   [name], MD  cyclobenzaprine (FLEXERIL) 5 MG tablet Take 5 mg by mouth daily as needed for muscle spasms.     Historical Provider, MD  dabigatran (PRADAXA) 150 MG CAPS capsule TAKE ONE CAPSULE BY MOUTH TWICE DAILY 04/02/14   Historical Provider, MD  diphenoxylate-atropine (LOMOTIL) 2.5-0.025 MG tablet Take 1 tablet by mouth 4 (four) times daily as needed for diarrhea or loose stools. 05/01/15   Forest Gleason, MD  fentaNYL (DURAGESIC - DOSED MCG/HR) 75 MCG/HR Place 1 patch (75 mcg total) onto the skin every 3 (three) days. 07/01/15   Forest Gleason, MD  gabapentin (NEURONTIN) 100 MG capsule Take 100 mg by mouth See admin instructions. Take 1 capsule in the morning and 2pm. Take 2 capsules ('200mg'$ ) by mouth every night at bedtime. 08/15/14   Historical Provider, MD  glipiZIDE (GLUCOTROL) 10 MG tablet Take 10 mg by mouth See admin instructions. Take 1 tablet by mouth every morning and take 1/2 tablet ('5mg'$ ) by mouth in the evening. 10/01/14   Historical Provider, MD  insulin lispro (HUMALOG) 100 UNIT/ML KiwkPen Inject 0.04-0.12 mLs (4-12 Units total) into the skin 4 (four) times daily -  with meals and at bedtime. < 150 BS - no insulin, > 151-200 - give 4 units, 201-250 - given 6 units, 251-300 - given 8 units, 301-350 - give 10 units, 351-400 - give 12 units.  Call MD for BS > 400. 06/26/15   [name], MD  isosorbide mononitrate (IMDUR) 60 MG 24 hr tablet Take 60 mg by mouth daily.  09/10/14   Historical Provider, MD  levofloxacin (LEVAQUIN) 750 MG tablet Take 1 tablet  (750 mg total) by mouth daily before breakfast. 06/26/15   [name], MD  levothyroxine (SYNTHROID, LEVOTHROID) 25 MCG tablet Take 25 mcg by mouth daily before breakfast.  07/23/14   Historical Provider, MD  lidocaine-prilocaine (EMLA) cream Apply 1 application topically as needed. 03/27/15   Forest Gleason, MD  Magnesium-Calcium-Folic Acid 093-235-5 MG TABS Take 1 tablet by mouth daily.  12/02/09   Historical Provider, MD  metoprolol-hydrochlorothiazide (LOPRESSOR HCT) 50-25 MG per tablet Take 1 tablet by mouth daily.  04/13/14   Historical Provider, MD  nitroGLYCERIN (NITROSTAT) 0.4 MG SL tablet Place 0.4 mg under the tongue every 5 (five) minutes as needed for chest pain.  05/29/14 05/29/15  Historical Provider, MD  ondansetron (  ZOFRAN) 4 MG tablet Take 1 tablet (4 mg total) by mouth every 6 (six) hours as needed. 06/04/15   Forest Gleason, MD  oxyCODONE (ROXICODONE) 15 MG immediate release tablet Take 1 tablet (15 mg total) by mouth every 6 (six) hours. 06/26/15   [name], MD  potassium chloride (K-DUR) 10 MEQ tablet Take 2 tablets (20 mEq total) by mouth daily. 02/14/15   Evlyn Kanner, NP  promethazine (PHENERGAN) 25 MG tablet TAKE ONE TABLET BY MOUTH EVERY SIX HOURSAS NEEDED FOR NAUSEA OR VOMITING 01/21/15   Evlyn Kanner, NP  RABEprazole (ACIPHEX) 20 MG tablet Take 20 mg by mouth daily.     Historical Provider, MD  senna-docusate (SENNA S) 8.6-50 MG tablet Take 1 tablet by mouth 2 (two) times daily. 05/16/15   Forest Gleason, MD  simvastatin (ZOCOR) 20 MG tablet Take 20 mg by mouth every evening.  08/27/14   Historical Provider, MD      PHYSICAL EXAMINATION:   VITAL SIGNS: Blood pressure 110/65, pulse 64, temperature 97.6 F (36.4 C), resp. rate 11, height '5\' 5"'$  (1.651 m), weight 58.968 kg (130 lb), SpO2 98 %.  GENERAL:  82 y.o.-year-old patient lying in the bed with no acute distress.  EYES: Pupils equal, round, reactive to light and accommodation. No scleral icterus. Extraocular  muscles intact.  HEENT: Head atraumatic, normocephalic. Oropharynx and nasopharynx clear.  NECK:  Supple, no jugular venous distention. No thyroid enlargement, no tenderness.  LUNGS: Normal breath sounds bilaterally, no wheezing, rales,rhonchi or crepitation. No use of accessory muscles of respiration.  CARDIOVASCULAR: S1, S2 normal. systolic murmurs .  ABDOMEN: Soft, nontender, nondistended. Bowel sounds present. No organomegaly or mass.  EXTREMITIES: No pedal edema, cyanosis, or clubbing.  NEUROLOGIC: Cranial nerves II through XII are intact. Muscle strength 4/5 in all extremities. Sensation intact. Gait not checked.  PSYCHIATRIC: The patient is alert and oriented x 3.  SKIN: No obvious rash, lesion, or ulcer.   LABORATORY PANEL:   CBC  Recent Labs Lab 08/25/15 1927 08/29/15 1835  WBC 8.5 7.7  HGB 10.9* 10.8*  HCT 33.6* 34.4*  PLT 217 167  MCV 88.7 90.3  MCH 28.8 28.4  MCHC 32.5 31.5*  RDW 18.7* 19.6*  LYMPHSABS 0.9* 0.9*  MONOABS 0.8 0.7  EOSABS 0.0 0.1  BASOSABS 0.0 0.0   ------------------------------------------------------------------------------------------------------------------  Chemistries   Recent Labs Lab 08/25/15 1927 08/29/15 1835  NA 138 141  K 3.8 4.7  CL 95* 98*  CO2 37* 38*  GLUCOSE 59* 81  BUN 31* 34*  CREATININE 1.30* 1.48*  CALCIUM 9.3 9.1  AST 94*  --   ALT 36  --   ALKPHOS 297*  --   BILITOT 0.9  --    ------------------------------------------------------------------------------------------------------------------ estimated creatinine clearance is 27.3 mL/min (by C-G formula based on Cr of 1.48). ------------------------------------------------------------------------------------------------------------------ No results for input(s): TSH, T4TOTAL, T3FREE, THYROIDAB in the last 72 hours.  Invalid input(s): FREET3   Coagulation profile No results for input(s): INR, PROTIME in the last 168  hours. ------------------------------------------------------------------------------------------------------------------- No results for input(s): DDIMER in the last 72 hours. -------------------------------------------------------------------------------------------------------------------  Cardiac Enzymes  Recent Labs Lab 08/29/15 1835  TROPONINI <0.03   ------------------------------------------------------------------------------------------------------------------ Invalid input(s): POCBNP  ---------------------------------------------------------------------------------------------------------------  Urinalysis    Component Value Date/Time   COLORURINE YELLOW* 08/29/2015 2024   COLORURINE RED 01/13/2014 0900   APPEARANCEUR CLEAR* 08/29/2015 2024   APPEARANCEUR CLOUDY 01/13/2014 0900   LABSPEC 1.025 08/29/2015 2024   LABSPEC 1.024 01/13/2014 0900   PHURINE 6.0 08/29/2015  2024   PHURINE see comment 01/13/2014 0900   GLUCOSEU NEGATIVE 08/29/2015 2024   GLUCOSEU see comment 01/13/2014 0900   HGBUR NEGATIVE 08/29/2015 2024   HGBUR see comment 01/13/2014 0900   BILIRUBINUR NEGATIVE 08/29/2015 2024   BILIRUBINUR see comment 01/13/2014 0900   KETONESUR NEGATIVE 08/29/2015 2024   KETONESUR see comment 01/13/2014 0900   PROTEINUR 30* 08/29/2015 2024   PROTEINUR see comment 01/13/2014 0900   NITRITE NEGATIVE 08/29/2015 2024   NITRITE SEE COMMENT 01/13/2014 0900   LEUKOCYTESUR NEGATIVE 08/29/2015 2024   LEUKOCYTESUR see comment 01/13/2014 0900     RADIOLOGY: Ct Head Wo Contrast  08/29/2015  CLINICAL DATA:  Recent fall with headaches and neck pain, history of carcinoma the breast EXAM: CT HEAD WITHOUT CONTRAST CT CERVICAL SPINE WITHOUT CONTRAST TECHNIQUE: Multidetector CT imaging of the head and cervical spine was performed following the standard protocol without intravenous contrast. Multiplanar CT image reconstructions of the cervical spine were also generated. COMPARISON:   06/22/2015 FINDINGS: CT HEAD FINDINGS The bony calvarium is intact. Prior lacunar infarct is noted in the basal ganglia on the left and stable. Mild chronic white matter ischemic changes are seen. No findings to suggest acute hemorrhage, acute infarction or space-occupying mass lesion are noted. CT CERVICAL SPINE FINDINGS Seven cervical segments are well visualized. Vertebral body height is well maintained. Osteophytic changes are noted at multiple levels as well as facet hypertrophic changes. Stable spondylolisthesis of C7 on T1 is noted of a degenerative nature. Right-sided chest wall port is seen. Scanning into the lung apices demonstrates persistent changes in the lungs similar to that noted on the recent CT of chest from 08/25/2015 IMPRESSION: CT of the head:  Chronic white matter ischemic changes No acute intracranial abnormality is noted. CT of the cervical spine: Chronic degenerative changes without acute abnormality. Electronically Signed   By: Inez Catalina M.D.   On: 08/29/2015 18:27   Ct Cervical Spine Wo Contrast  08/29/2015  CLINICAL DATA:  Recent fall with headaches and neck pain, history of carcinoma the breast EXAM: CT HEAD WITHOUT CONTRAST CT CERVICAL SPINE WITHOUT CONTRAST TECHNIQUE: Multidetector CT imaging of the head and cervical spine was performed following the standard protocol without intravenous contrast. Multiplanar CT image reconstructions of the cervical spine were also generated. COMPARISON:  06/22/2015 FINDINGS: CT HEAD FINDINGS The bony calvarium is intact. Prior lacunar infarct is noted in the basal ganglia on the left and stable. Mild chronic white matter ischemic changes are seen. No findings to suggest acute hemorrhage, acute infarction or space-occupying mass lesion are noted. CT CERVICAL SPINE FINDINGS Seven cervical segments are well visualized. Vertebral body height is well maintained. Osteophytic changes are noted at multiple levels as well as facet hypertrophic changes.  Stable spondylolisthesis of C7 on T1 is noted of a degenerative nature. Right-sided chest wall port is seen. Scanning into the lung apices demonstrates persistent changes in the lungs similar to that noted on the recent CT of chest from 08/25/2015 IMPRESSION: CT of the head:  Chronic white matter ischemic changes No acute intracranial abnormality is noted. CT of the cervical spine: Chronic degenerative changes without acute abnormality. Electronically Signed   By: Inez Catalina M.D.   On: 08/29/2015 18:27    EKG: Orders placed or performed during the hospital encounter of 08/29/15  . ED EKG  . ED EKG  . EKG 12-Lead  . EKG 12-Lead    IMPRESSION AND PLAN:  * Hypoglycemia   Blood sugar responded to initial dextro  injection.   We'll keep monitoring blood sugar every 2 hours for tonight, and keep her on D5NS IV drip.   Urinalysis is negative, chest x-ray is ordered but not done yet.   Patient has some dehydration possibly that is the reason as her insulin and glipizide might have been accumulated more than therapeutic level because of her renal function worsening.   Currently I will hold oral antidiabetic medications.  * Dehydration with acute renal failure   IV fluids for now and monitor tomorrow.  * Generalized weakness and fall   Get physical therapy evaluation, she was already on hospice care at [facility], but maybe she needed to go to a nursing home with hospice.  * Breast and uterine cancer   She is already on hospice care and DO NOT RESUSCITATE.   On multiple pain medications for pain control, I will continue the same over here.  * Hypertension   Continue home medications.   Hold nephrotoxic.  * Diabetes   Hold antidiabetic medications for now.  * CHF and A. fib, status post pacemaker.   She is on Pradaxa and rate controlling medications.   Continue currently, no signs of exacerbation.   All the records are reviewed and case discussed with ED provider. Management plans  discussed with the patient, family and they are in agreement.  CODE STATUS: DO NOT RESUSCITATE Code Status History    Date Active Date Inactive Code Status Order ID Comments User Context   06/22/2015 12:15 PM 06/26/2015  6:28 PM DNR [id number]  [name], MD Inpatient    Questions for Most Recent Historical Code Status (Order [id number])    Question Answer Comment   In the event of cardiac or respiratory ARREST Do not call a "code blue"    In the event of cardiac or respiratory ARREST Do not perform Intubation, CPR, defibrillation or ACLS    In the event of cardiac or respiratory ARREST Use medication by any route, position, wound care, and other measures to relive pain and suffering. May use oxygen, suction and manual treatment of airway obstruction as needed for comfort.        TOTAL TIME TAKING CARE OF THIS PATIENT: 50 minutes.    Vaughan Basta M.D on 08/29/2015   Between 7am to 6pm - Pager - [phone]  After 6pm go to [email or web address] - password [email or web address] [facility]  Office  [phone]  CC: Primary care physician; Tracie Harrier, MD   Note: This dictation was prepared with Dragon dictation along with smaller phrase technology. Any transcriptional errors that result from this process are unintentional.

## 2015-08-29 NOTE — ED Notes (Signed)
2008 Dr Joni Fears  Notified of FSBS 45 - this writer checked blood sugar due to decreased level of alertness - pt still arouses when spoken to and answers appropriately

## 2015-08-29 NOTE — ED Notes (Signed)
MD notified pt's CBG 68.

## 2015-08-29 NOTE — ED Provider Notes (Signed)
[facility] Department Provider Note  ____________________________________________  Time seen: 5:40 PM  I have reviewed the triage vital signs and the nursing notes.   HISTORY  Chief Complaint Fall Level 5 caveat:  Portions of the history and physical were unable to be obtained due to the patient's acute illness and altered mental status    HPI Wendy Giles is a 82 y.o. female brought to the ED from assisted living facility due to a fall. She was given her usual 4 units of insulin and then when he checked on her again and they found that she was on the ground. The patient complained of headache and left hip pain at the time. Brought To the ED in a cervical collar..     Past Medical History  Diagnosis Date  . CHF (congestive heart failure) ([place])   . Diabetes mellitus without complication ([place])   . Hypertension   . Presence of permanent cardiac pacemaker   . Dysrhythmia     afib  . Depression   . GERD (gastroesophageal reflux disease)   . Coronary artery disease   . Cancer ([place])     breast,uterine  . COPD (chronic obstructive pulmonary disease) ([place])   . Arthritis   . Anemia   . Anginal pain ([place])   . Cancer of upper lobe of right lung ([facility]) 11/03/2014  . Breast cancer [facility])      Patient Active Problem List   Diagnosis Date Noted  . Confusion 08/29/2015  . Hypoglycemia   . Protein-calorie malnutrition, severe 06/24/2015  . Sepsis ([place]) 06/22/2015  . Cancer of upper lobe of right lung ([place]) 11/03/2014  . History of biliary T-tube placement 10/29/2014  . Mass of lung   . Breath shortness 01/02/2014  . Artificial cardiac pacemaker 01/01/2014  . Diabetes ([place]) 01/01/2014  . H/O cardiac catheterization 01/01/2014  . History of cardioversion 01/01/2014  . S/P coronary artery balloon dilation 01/01/2014  . Paroxysmal atrial fibrillation ([place]) 01/01/2014  . Diabetes mellitus ([place]) 01/01/2014     Past Surgical History   Procedure Laterality Date  . Cholecystectomy    . Coronary angioplasty    . Coronary artery bypass graft    . Appendectomy    . Mastectomy    . Ablation of dysrhythmic focus    . Cardioversion    . Joint replacement      tkr  . Insert / replace / remove pacemaker    . Peripheral vascular catheterization N/A 11/05/2014    Procedure: Glori Luis Cath Insertion;  Surgeon: Algernon Huxley, MD;  Location: [facility];  Service: Cardiovascular;  Laterality: N/A;  . Endobronchial ultrasound N/A 10/24/2014    Procedure: ENDOBRONCHIAL ULTRASOUND;  Surgeon: Vilinda Boehringer, MD;  Location: [facility] OR[facility];  Service: Cardiopulmonary;  Laterality: N/A;     Current Outpatient Rx  Name  Route  Sig  Dispense  Refill  . acetaminophen (TYLENOL) 325 MG tablet   Oral   Take 650 mg by mouth every 6 (six) hours as needed for moderate pain, fever or headache.         . albuterol (PROAIR HFA) 108 (90 BASE) MCG/ACT inhaler   Inhalation   Inhale 2 puffs into the lungs every 6 (six) hours as needed for wheezing or shortness of breath.          . ALPRAZolam (XANAX) 0.5 MG tablet   Oral   Take 1 tablet (0.5 mg total) by mouth daily as needed for anxiety.  30 tablet   0   . cyclobenzaprine (FLEXERIL) 5 MG tablet   Oral   Take 5 mg by mouth daily as needed for muscle spasms.          . dabigatran (PRADAXA) 150 MG CAPS capsule      TAKE ONE CAPSULE BY MOUTH TWICE DAILY         . diphenoxylate-atropine (LOMOTIL) 2.5-0.025 MG tablet   Oral   Take 1 tablet by mouth 4 (four) times daily as needed for diarrhea or loose stools.   30 tablet   0   . fentaNYL (DURAGESIC - DOSED MCG/HR) 75 MCG/HR   Transdermal   Place 1 patch (75 mcg total) onto the skin every 3 (three) days.   5 patch   0     HOSPICE PATIENT   . gabapentin (NEURONTIN) 100 MG capsule   Oral   Take 100 mg by mouth See admin instructions. Take 1 capsule in the morning and 2pm. Take 2 capsules ('200mg'$ ) by mouth every night at bedtime.          Marland Kitchen glipiZIDE (GLUCOTROL) 10 MG tablet   Oral   Take 10 mg by mouth See admin instructions. Take 1 tablet by mouth every morning and take 1/2 tablet ('5mg'$ ) by mouth in the evening.      0   . insulin lispro (HUMALOG) 100 UNIT/ML KiwkPen   Subcutaneous   Inject 0.04-0.12 mLs (4-12 Units total) into the skin 4 (four) times daily -  with meals and at bedtime. < 150 BS - no insulin, > 151-200 - give 4 units, 201-250 - given 6 units, 251-300 - given 8 units, 301-350 - give 10 units, 351-400 - give 12 units.  Call MD for BS > 400.   15 mL   11   . isosorbide mononitrate (IMDUR) 60 MG 24 hr tablet   Oral   Take 60 mg by mouth daily.          Marland Kitchen levofloxacin (LEVAQUIN) 750 MG tablet   Oral   Take 1 tablet (750 mg total) by mouth daily before breakfast.   5 tablet   0   . levothyroxine (SYNTHROID, LEVOTHROID) 25 MCG tablet   Oral   Take 25 mcg by mouth daily before breakfast.          . lidocaine-prilocaine (EMLA) cream   Topical   Apply 1 application topically as needed.   30 g   3   . Magnesium-Calcium-Folic Acid 956-387-5 MG TABS   Oral   Take 1 tablet by mouth daily.          . metoprolol-hydrochlorothiazide (LOPRESSOR HCT) 50-25 MG per tablet   Oral   Take 1 tablet by mouth daily.          Marland Kitchen EXPIRED: nitroGLYCERIN (NITROSTAT) 0.4 MG SL tablet   Sublingual   Place 0.4 mg under the tongue every 5 (five) minutes as needed for chest pain.          Marland Kitchen ondansetron (ZOFRAN) 4 MG tablet   Oral   Take 1 tablet (4 mg total) by mouth every 6 (six) hours as needed.   30 tablet   3   . oxyCODONE (ROXICODONE) 15 MG immediate release tablet   Oral   Take 1 tablet (15 mg total) by mouth every 6 (six) hours.   30 tablet   0   . potassium chloride (K-DUR) 10 MEQ tablet   Oral   Take 2 tablets (20  mEq total) by mouth daily.   60 tablet   0   . promethazine (PHENERGAN) 25 MG tablet      TAKE ONE TABLET BY MOUTH EVERY SIX HOURSAS NEEDED FOR NAUSEA OR VOMITING    30 tablet   0   . RABEprazole (ACIPHEX) 20 MG tablet   Oral   Take 20 mg by mouth daily.          Marland Kitchen senna-docusate (SENNA S) 8.6-50 MG tablet   Oral   Take 1 tablet by mouth 2 (two) times daily.   60 tablet   3   . simvastatin (ZOCOR) 20 MG tablet   Oral   Take 20 mg by mouth every evening.             Allergies Amoxicillin-pot clavulanate; Latex; and Other   Family History  Problem Relation Age of Onset  . CVA Mother   . Cancer Father     Social History Social History  Substance Use Topics  . Smoking status: Former Smoker -- 1.00 packs/day for 18 years    Types: Cigarettes    Quit date: 10/21/1997  . Smokeless tobacco: Never Used  . Alcohol Use: No    Review of Systems  Constitutional:   No fever or chills.  Eyes:   No vision changes.  ENT:   No sore throat. No rhinorrhea. Cardiovascular:   No chest pain. Respiratory:   No dyspnea or cough. Gastrointestinal:   Negative for abdominal pain, vomiting and diarrhea.  No bloody stool. Genitourinary:   Negative for dysuria or difficulty urinating. Musculoskeletal:   Left hip pain Neurological:   Frontal headache 10-point ROS otherwise negative.  ____________________________________________   PHYSICAL EXAM:  VITAL SIGNS: ED Triage Vitals  Enc Vitals Group     BP 08/29/15 1801 117/69 mmHg     Pulse Rate 08/29/15 1801 93     Resp 08/29/15 1801 20     Temp 08/29/15 1806 97.6 F (36.4 C)     Temp src --      SpO2 08/29/15 1754 97 %     Weight 08/29/15 1801 130 lb (58.968 kg)     Height 08/29/15 1801 '5\' 5"'$  (1.651 m)     Head Cir --      Peak Flow --      Pain Score --      Pain Loc --      Pain Edu? --      Excl. in [place]? --     Vital signs reviewed, nursing assessments reviewed.   Constitutional:   Alert and orientedTo self. Ill-appearing. Eyes:   No scleral icterus. No conjunctival pallor. PERRL. EOMI ENT   Head:   Normocephalic with scattered bilateral forehead abrasions.   Nose:    No congestion/rhinnorhea. No septal hematoma   Mouth/Throat:   Dry mucous membranes, no pharyngeal erythema. No peritonsillar mass.    Neck:   No stridor. No SubQ emphysema. No meningismus. No midline tenderness or step-off or crepitus Hematological/Lymphatic/Immunilogical:   No cervical lymphadenopathy. Cardiovascular:   RRR. Symmetric bilateral radial and DP pulses.  No murmurs.  Respiratory:   Normal respiratory effort without tachypnea nor retractions. Breath sounds are clear and equal bilaterally. No wheezes/rales/rhonchi. Gastrointestinal:   Soft and nontender. Non distended. There is no CVA tenderness.  No rebound, rigidity, or guarding. Genitourinary:   deferred Musculoskeletal:   Nontender with normal range of motion in all extremities. No joint effusions.  No lower extremity tenderness.  No edema. Neurologic:  Mumbling incoherent speech.  Confused CN 2-10 normal. Motor grossly intact. No gross focal neurologic deficits are appreciated.  Skin:    Skin is warm, dry and intact. No rash noted.  Scattered bilateral upper extremity ecchymosis No petechiae, purpura, or bullae.  ____________________________________________    LABS (pertinent positives/negatives) (all labs ordered are listed, but only abnormal results are displayed) Labs Reviewed  BASIC METABOLIC PANEL - Abnormal; Notable for the following:    Chloride 98 (*)    CO2 38 (*)    BUN 34 (*)    Creatinine, Ser 1.48 (*)    GFR calc non Af Amer 32 (*)    GFR calc Af Amer 37 (*)    All other components within normal limits  CBC WITH DIFFERENTIAL/PLATELET - Abnormal; Notable for the following:    Hemoglobin 10.8 (*)    HCT 34.4 (*)    MCHC 31.5 (*)    RDW 19.6 (*)    Lymphs Abs 0.9 (*)    All other components within normal limits  URINALYSIS COMPLETEWITH MICROSCOPIC ([facility] ONLY) - Abnormal; Notable for the following:    Color, Urine YELLOW (*)    APPearance CLEAR (*)    Protein, ur 30 (*)    All other  components within normal limits  GLUCOSE, CAPILLARY - Abnormal; Notable for the following:    Glucose-Capillary 45 (*)    All other components within normal limits  GLUCOSE, CAPILLARY - Abnormal; Notable for the following:    Glucose-Capillary 138 (*)    All other components within normal limits  TROPONIN I  CBG MONITORING, ED   ____________________________________________   EKG  Interpreted by me Atrial fibrillation, ventricular paced, rate of 65. Left bundle branch block. No acute ischemic changes.  ____________________________________________    RADIOLOGY  CT head unremarkable CT cervical spine unremarkable  ____________________________________________   PROCEDURES   ____________________________________________   INITIAL IMPRESSION / ASSESSMENT AND PLAN / ED COURSE  Pertinent labs & imaging results that were available during my care of the patient were reviewed by me and considered in my medical decision making (see chart for details).  Patient presents after fall, possibly related to insulin use, fingerstick 1:30 by EMS. After continuing to monitor we found that the patient's blood glucose did decrease into the 60s and then into the 40s. She was given D50 for this. She remained disoriented after D50 and according to family at the bedside, since the fall she has been confused and not herself. Case discussed with the hospitalist for admission for acute altered mental status and hypoglycemia.     ____________________________________________   FINAL CLINICAL IMPRESSION(S) / ED DIAGNOSES  Final diagnoses:  Disorientation  Hypoglycemia       Portions of this note were generated with dragon dictation software. Dictation errors may occur despite best attempts at proofreading.   Carrie Mew, MD 08/29/15 2130

## 2015-08-29 NOTE — ED Notes (Signed)
Pt brought to er by ems after fall at nursing facility today - nursing facility reports they gave 4 units of regular insulin and then came back to pt that was less responsive and had fallen - pt c/o headache and lt hip pain

## 2015-08-29 NOTE — ED Notes (Addendum)
Pt brought to er by ems after fall at nursing facility today - nursing facility reports they gave 4 units of regular insulin and then came back to pt that was less responsive and had fallen - pt c/o headache and lt hip pain - CBG per ems 130 - pt c/o pain yet is resting quietly on stretcher and only responds if spoken to - pt responds without difficulty and appropriately - MD aware

## 2015-08-30 LAB — BASIC METABOLIC PANEL
Anion gap: 4 — ABNORMAL LOW (ref 5–15)
BUN: 31 mg/dL — AB (ref 6–20)
CHLORIDE: 101 mmol/L (ref 101–111)
CO2: 36 mmol/L — AB (ref 22–32)
Calcium: 8.6 mg/dL — ABNORMAL LOW (ref 8.9–10.3)
Creatinine, Ser: 1.18 mg/dL — ABNORMAL HIGH (ref 0.44–1.00)
GFR calc Af Amer: 49 mL/min — ABNORMAL LOW (ref >60)
GFR calc non Af Amer: 42 mL/min — ABNORMAL LOW (ref >60)
GLUCOSE: 169 mg/dL — AB (ref 65–99)
POTASSIUM: 4.3 mmol/L (ref 3.5–5.1)
Sodium: 141 mmol/L (ref 135–145)

## 2015-08-30 LAB — GLUCOSE, CAPILLARY
GLUCOSE-CAPILLARY: 116 mg/dL — AB (ref 65–99)
GLUCOSE-CAPILLARY: 121 mg/dL — AB (ref 65–99)
GLUCOSE-CAPILLARY: 163 mg/dL — AB (ref 65–99)
Glucose-Capillary: 165 mg/dL — ABNORMAL HIGH (ref 65–99)
Glucose-Capillary: 176 mg/dL — ABNORMAL HIGH (ref 65–99)

## 2015-08-30 LAB — CBC
HEMATOCRIT: 30.8 % — AB (ref 35.0–47.0)
Hemoglobin: 9.9 g/dL — ABNORMAL LOW (ref 12.0–16.0)
MCH: 28.9 pg (ref 26.0–34.0)
MCHC: 32 g/dL (ref 32.0–36.0)
MCV: 90.2 fL (ref 80.0–100.0)
Platelets: 159 10*3/uL (ref 150–440)
RBC: 3.41 MIL/uL — ABNORMAL LOW (ref 3.80–5.20)
RDW: 19.5 % — AB (ref 11.5–14.5)
WBC: 7.6 10*3/uL (ref 3.6–11.0)

## 2015-08-30 LAB — MRSA PCR SCREENING: MRSA by PCR: NEGATIVE

## 2015-08-30 MED ORDER — FENTANYL 100 MCG/HR TD PT72: 100.0000 ug | MEDICATED_PATCH | TRANSDERMAL | Status: AC

## 2015-08-30 MED ORDER — GABAPENTIN 300 MG PO CAPS
300.0000 mg | ORAL_CAPSULE | Freq: Three times a day (TID) | ORAL | Status: DC
  Administered 2015-08-30: 300 mg via ORAL
  Filled 2015-08-30: qty 1

## 2015-08-30 MED ORDER — CHLORHEXIDINE GLUCONATE 0.12 % MT SOLN: 15.0000 mL | Freq: Two times a day (BID) | OROMUCOSAL | Status: DC

## 2015-08-30 MED ORDER — MORPHINE SULFATE (CONCENTRATE) 10 MG/0.5ML PO SOLN
20.0000 mg | ORAL | Status: DC | PRN
  Administered 2015-08-30: 20 mg via ORAL
  Filled 2015-08-30: qty 1

## 2015-08-30 MED ORDER — FENTANYL 100 MCG/HR TD PT72: 100.0000 ug | MEDICATED_PATCH | TRANSDERMAL | Status: DC

## 2015-08-30 MED ORDER — CETYLPYRIDINIUM CHLORIDE 0.05 % MT LIQD: 7.0000 mL | Freq: Two times a day (BID) | OROMUCOSAL | Status: DC

## 2015-08-30 NOTE — Progress Notes (Addendum)
Visit made. Patient is followed by hospice and [facility] at [facility] with a hospice diagnosis of Lung Ca. She is a DNR. Transfer summary and portable DNR in place on patient's hospital chart. Patient was admitted to [facility] last evening following a fall, she was treated for hypoglycemia in the ED.  Patient seen lying in bed, very dyspneic and congested, did rouse to name and report pain/discomfort. Physical therapy present, patient was unable to even sit at the side of the bed. Writer spoke with Staff RN Pryor Montes and with attending physician Dr. Bridgett Larsson in regards to symptom management. Patient to receive a dose of PRN liquid morphine and IV fluids to be stopped. Patient's friend/caregiver Rip Harbour present in the room, discussed patient's decline, Hospice Social Worker also present and reported decline noted over the past two weeks. Of note patient has had been experiencing more pain and per her facility [facility] had her fentanyl patch increased to 100 mcg, she currently has on a 75 mcg patch.  Rip Harbour has spoken to patient's brother [name], who lives in [place] and discussed patient's need for symptom managment. Hospice Home has been discussed with hospice team as well.  Per discussion with Gaye Pollack CSW Nicki Reaper ,staff RN Pryor Montes and Dr. Bridgett Larsson, patient to be transferred to the hospice home via EMS today. Patient is also agreeable to transport. Writer to call report and set up transport. Hospital care team all aware. Thank you. Flo Shanks RN, BSN, [place] [facility], [facility] [phone]

## 2015-08-30 NOTE — Discharge Instructions (Signed)
Heart healthy and ADA diet. Activity as tolerated. Hospice care.

## 2015-08-30 NOTE — Clinical Social Work Note (Signed)
Patient to go to hospice home today as arranged by Santiago Glad with Hospice. CSW has prepared discharge packet. Shela Leff MSW,LCSW [phone]

## 2015-08-30 NOTE — Progress Notes (Signed)
PT Cancellation Note  Patient Details Name: Wendy Giles MRN: [id number] DOB: [birth year]   Cancelled Treatment:    Reason Eval/Treat Not Completed: Other (comment).  PT attempted to initiate eval but prior to being able to mobilize pt or perform any physical assessment, Hospice Liaison entered room and discussing concerns regarding pt's medical status and physical therapy.  PT eval ended d/t concerns regarding POC and appropriateness for PT intervention.  Per notes, plan now for pt to discharge to hospice home today.  D/t this, will complete current PT order.  Please re-consult PT if POC changes and if any PT needs are identified.   Raquel Sarna [place] 08/30/2015, 11:27 AM Leitha Bleak, [place]

## 2015-08-30 NOTE — Progress Notes (Signed)
Pt was D/C'd per MD order to hospice home. EMS transported pt.

## 2015-08-30 NOTE — Discharge Summary (Signed)
[facility] NAME: Wendy Giles    MR#:  [id number]  DATE OF BIRTH:  03/07/1933  DATE OF ADMISSION:  08/29/2015 ADMITTING PHYSICIAN: Vaughan Basta, MD  DATE OF DISCHARGE: 08/30/2015 PRIMARY CARE PHYSICIAN: Tracie Harrier, MD    ADMISSION DIAGNOSIS:  Hypoglycemia [E16.2] Disorientation [R41.0]   DISCHARGE DIAGNOSIS:  Hypoglycemia Dehydration with acute renal failure Generalized weakness and fall Acute on chronic CHF, unknown type. SECONDARY DIAGNOSIS:   Past Medical History  Diagnosis Date  . CHF (congestive heart failure) ([place])   . Diabetes mellitus without complication ([place])   . Hypertension   . Presence of permanent cardiac pacemaker   . Dysrhythmia     afib  . Depression   . GERD (gastroesophageal reflux disease)   . Coronary artery disease   . Cancer ([place])     breast,uterine  . COPD (chronic obstructive pulmonary disease) ([place])   . Arthritis   . Anemia   . Anginal pain ([place])   . Cancer of upper lobe of right lung ([place]) 11/03/2014  . Breast cancer [facility])     HOSPITAL COURSE:   * Hypoglycemia  She was put on D5NS IV drip, which was discontinued due to worsening SOB this am.  Urinalysis is negative, chest x-ray is ordered but not done yet. Hold glipizide.  * Dehydration with acute renal failure  better with IV fluids.  * Generalized weakness and fall Hospice care.  * Breast and uterine cancer  She is already on hospice care and DO NOT RESUSCITATE.  On multiple pain medications for pain control.  * Hypertension  Continue home medications.  Hold nephrotoxic.  * Diabetes  Hold antidiabetic medications.  * A. fib, status post pacemaker.  She is on Pradaxa and rate controlling medications.  * Acute on chronic CHF, unknown type. Continue lasix, no further workup to find wether systolic or diastolic since she is being discharged back to hospice home.   Acute on chronic  respiratory failure. Continue O2 [place] 3L, NEB prn.  Very poor prognosis. I discussed with hospice staff. The patient has many medications. I will defer to PCP to adjust.  DISCHARGE CONDITIONS:   Poor prognosis, discharge back to Hospice home today.  CONSULTS OBTAINED:     DRUG ALLERGIES:   Allergies  Allergen Reactions  . Amoxicillin-Pot Clavulanate Hives, Diarrhea and Other (See Comments)    Patient is unable to answer follow up questions and family in unaware of allergies  . Clavulanic Acid Other (See Comments)    Reaction: unknown  . Latex Rash    DISCHARGE MEDICATIONS:   Current Discharge Medication List    CONTINUE these medications which have CHANGED   Details  fentaNYL (DURAGESIC - DOSED MCG/HR) 100 MCG/HR Place 1 patch (100 mcg total) onto the skin every 3 (three) days. Qty: 5 patch, Refills: 0      CONTINUE these medications which have NOT CHANGED   Details  acetaminophen (TYLENOL) 325 MG tablet Take 650 mg by mouth every 6 (six) hours as needed for moderate pain, fever or headache.    albuterol (PROAIR HFA) 108 (90 BASE) MCG/ACT inhaler Inhale 2 puffs into the lungs every 6 (six) hours as needed for wheezing or shortness of breath.    Associated Diagnoses: Small cell carcinoma of lung, unspecified laterality (HCC)    ALPRAZolam (XANAX) 0.5 MG tablet Take 0.5 mg by mouth every 6 (six) hours as needed (for agitation/ shortness of breath).    cyclobenzaprine (  FLEXERIL) 5 MG tablet Take 5 mg by mouth daily as needed for muscle spasms.     dabigatran (PRADAXA) 150 MG CAPS capsule Take 150 mg by mouth 2 (two) times daily.     diphenoxylate-atropine (LOMOTIL) 2.5-0.025 MG tablet Take 1 tablet by mouth 4 (four) times daily as needed for diarrhea or loose stools. Qty: 30 tablet, Refills: 0    !! docusate sodium (COLACE) 100 MG capsule Take 100 mg by mouth 3 (three) times daily.    !! docusate sodium (COLACE) 100 MG capsule Take 100 mg by mouth 2 (two) times daily as  needed for mild constipation.    furosemide (LASIX) 40 MG tablet Take 40 mg by mouth daily.    gabapentin (NEURONTIN) 300 MG capsule Take 300 mg by mouth 3 (three) times daily.    insulin lispro (HUMALOG) 100 UNIT/ML KiwkPen Inject 0.04-0.12 mLs (4-12 Units total) into the skin 4 (four) times daily -  with meals and at bedtime. < 150 BS - no insulin, > 151-200 - give 4 units, 201-250 - given 6 units, 251-300 - given 8 units, 301-350 - give 10 units, 351-400 - give 12 units.  Call MD for BS > 400. Qty: 15 mL, Refills: 11    isosorbide mononitrate (IMDUR) 60 MG 24 hr tablet Take 60 mg by mouth daily.     levothyroxine (SYNTHROID, LEVOTHROID) 25 MCG tablet Take 25 mcg by mouth daily before breakfast.     metoprolol-hydrochlorothiazide (LOPRESSOR HCT) 50-25 MG per tablet Take 1 tablet by mouth daily.     morphine (ROXANOL) 20 MG/ML concentrated solution Take 20 mg by mouth every hour as needed for severe pain.    nitroGLYCERIN (NITROSTAT) 0.4 MG SL tablet Place 0.4 mg under the tongue every 5 (five) minutes as needed for chest pain.    ondansetron (ZOFRAN) 4 MG tablet Take 1 tablet (4 mg total) by mouth every 6 (six) hours as needed. Qty: 30 tablet, Refills: 3   Associated Diagnoses: Cancer of upper lobe of right lung (HCC)    oxyCODONE (ROXICODONE) 15 MG immediate release tablet Take 1 tablet (15 mg total) by mouth every 6 (six) hours. Qty: 30 tablet, Refills: 0    !! polyethylene glycol (MIRALAX / GLYCOLAX) packet Take 17 g by mouth every other day.    !! polyethylene glycol (MIRALAX / GLYCOLAX) packet Take 17 g by mouth 2 (two) times daily as needed for moderate constipation.    potassium chloride (K-DUR) 10 MEQ tablet Take 2 tablets (20 mEq total) by mouth daily. Qty: 60 tablet, Refills: 0    promethazine (PHENERGAN) 25 MG tablet Take 25 mg by mouth every 6 (six) hours as needed for nausea or vomiting.    RABEprazole (ACIPHEX) 20 MG tablet Take 20 mg by mouth daily.      ranitidine (ZANTAC) 150 MG capsule Take 150 mg by mouth 2 (two) times daily.    senna-docusate (SENNA S) 8.6-50 MG tablet Take 1 tablet by mouth 2 (two) times daily. Qty: 60 tablet, Refills: 3   Associated Diagnoses: Cancer of upper lobe of right lung ([place])     !! - Potential duplicate medications found. Please discuss with provider.    STOP taking these medications     glipiZIDE (GLUCOTROL) 10 MG tablet      fentaNYL (DURAGESIC - DOSED MCG/HR) 75 MCG/HR      simvastatin (ZOCOR) 20 MG tablet          DISCHARGE INSTRUCTIONS:    If you  experience worsening of your admission symptoms, develop shortness of breath, life threatening emergency, suicidal or homicidal thoughts you must seek medical attention immediately by calling 911 or calling your MD immediately  if symptoms less severe.  You Must read complete instructions/literature along with all the possible adverse reactions/side effects for all the Medicines you take and that have been prescribed to you. Take any new Medicines after you have completely understood and accept all the possible adverse reactions/side effects.   Please note  You were cared for by a hospitalist during your hospital stay. If you have any questions about your discharge medications or the care you received while you were in the hospital after you are discharged, you can call the unit and asked to speak with the hospitalist on call if the hospitalist that took care of you is not available. Once you are discharged, your primary care physician will handle any further medical issues. Please note that NO REFILLS for any discharge medications will be authorized once you are discharged, as it is imperative that you return to your primary care physician (or establish a relationship with a primary care physician if you do not have one) for your aftercare needs so that they can reassess your need for medications and monitor your lab values.    Today   SUBJECTIVE    The patient has worsening SOB, on O2 [place] 3L.   VITAL SIGNS:  Blood pressure 117/57, pulse 65, temperature 98 F (36.7 C), temperature source Oral, resp. rate 18, height 5' (1.524 m), weight 57.834 kg (127 lb 8 oz), SpO2 97 %.  I/O:   Intake/Output Summary (Last 24 hours) at 08/30/15 1003 Last data filed at 08/30/15 0943  Gross per 24 hour  Intake   1077 ml  Output      0 ml  Net   1077 ml    PHYSICAL EXAMINATION:  GENERAL:  82 y.o.-year-old patient lying in the bed with no acute respiratory distress.  EYES: Pupils equal, round, reactive to light and accommodation. No scleral icterus. Extraocular muscles intact.  HEENT: Head atraumatic, normocephalic. Oropharynx and nasopharynx clear.  NECK:  Supple, no jugular venous distention. No thyroid enlargement, no tenderness.  LUNGS: Normal breath sounds bilaterally, no wheezing, bilateral crackles. Mild use of accessory muscles of respiration.  CARDIOVASCULAR: S1, S2 normal. No murmurs, rubs, or gallops.  ABDOMEN: Soft, non-tender, non-distended. Bowel sounds present. No organomegaly or mass.  EXTREMITIES: No pedal edema, cyanosis, or clubbing.  NEUROLOGIC: Cranial nerves II through XII are intact. Muscle strength 3/5 in all extremities. Sensation intact. Gait not checked.  PSYCHIATRIC: The patient is alert and oriented x 3.  SKIN: No obvious rash, lesion, or ulcer.   DATA REVIEW:   CBC  Recent Labs Lab 08/30/15 0411  WBC 7.6  HGB 9.9*  HCT 30.8*  PLT 159    Chemistries   Recent Labs Lab 08/25/15 1927  08/30/15 0411  NA 138  < > 141  K 3.8  < > 4.3  CL 95*  < > 101  CO2 37*  < > 36*  GLUCOSE 59*  < > 169*  BUN 31*  < > 31*  CREATININE 1.30*  < > 1.18*  CALCIUM 9.3  < > 8.6*  AST 94*  --   --   ALT 36  --   --   ALKPHOS 297*  --   --   BILITOT 0.9  --   --   < > = values in this interval  not displayed.  Cardiac Enzymes  Recent Labs Lab 08/29/15 1835  TROPONINI <0.03    Microbiology Results  Results  for orders placed or performed during the hospital encounter of 08/29/15  MRSA PCR Screening     Status: None   Collection Time: 08/30/15  2:14 AM  Result Value Ref Range Status   MRSA by PCR NEGATIVE NEGATIVE Final    Comment:        The GeneXpert MRSA Assay (FDA approved for NASAL specimens only), is one component of a comprehensive MRSA colonization surveillance program. It is not intended to diagnose MRSA infection nor to guide or monitor treatment for MRSA infections.     RADIOLOGY:  Ct Head Wo Contrast  08/29/2015  CLINICAL DATA:  Recent fall with headaches and neck pain, history of carcinoma the breast EXAM: CT HEAD WITHOUT CONTRAST CT CERVICAL SPINE WITHOUT CONTRAST TECHNIQUE: Multidetector CT imaging of the head and cervical spine was performed following the standard protocol without intravenous contrast. Multiplanar CT image reconstructions of the cervical spine were also generated. COMPARISON:  06/22/2015 FINDINGS: CT HEAD FINDINGS The bony calvarium is intact. Prior lacunar infarct is noted in the basal ganglia on the left and stable. Mild chronic white matter ischemic changes are seen. No findings to suggest acute hemorrhage, acute infarction or space-occupying mass lesion are noted. CT CERVICAL SPINE FINDINGS Seven cervical segments are well visualized. Vertebral body height is well maintained. Osteophytic changes are noted at multiple levels as well as facet hypertrophic changes. Stable spondylolisthesis of C7 on T1 is noted of a degenerative nature. Right-sided chest wall port is seen. Scanning into the lung apices demonstrates persistent changes in the lungs similar to that noted on the recent CT of chest from 08/25/2015 IMPRESSION: CT of the head:  Chronic white matter ischemic changes No acute intracranial abnormality is noted. CT of the cervical spine: Chronic degenerative changes without acute abnormality. Electronically Signed   By: Inez Catalina M.D.   On: 08/29/2015 18:27    Ct Cervical Spine Wo Contrast  08/29/2015  CLINICAL DATA:  Recent fall with headaches and neck pain, history of carcinoma the breast EXAM: CT HEAD WITHOUT CONTRAST CT CERVICAL SPINE WITHOUT CONTRAST TECHNIQUE: Multidetector CT imaging of the head and cervical spine was performed following the standard protocol without intravenous contrast. Multiplanar CT image reconstructions of the cervical spine were also generated. COMPARISON:  06/22/2015 FINDINGS: CT HEAD FINDINGS The bony calvarium is intact. Prior lacunar infarct is noted in the basal ganglia on the left and stable. Mild chronic white matter ischemic changes are seen. No findings to suggest acute hemorrhage, acute infarction or space-occupying mass lesion are noted. CT CERVICAL SPINE FINDINGS Seven cervical segments are well visualized. Vertebral body height is well maintained. Osteophytic changes are noted at multiple levels as well as facet hypertrophic changes. Stable spondylolisthesis of C7 on T1 is noted of a degenerative nature. Right-sided chest wall port is seen. Scanning into the lung apices demonstrates persistent changes in the lungs similar to that noted on the recent CT of chest from 08/25/2015 IMPRESSION: CT of the head:  Chronic white matter ischemic changes No acute intracranial abnormality is noted. CT of the cervical spine: Chronic degenerative changes without acute abnormality. Electronically Signed   By: Inez Catalina M.D.   On: 08/29/2015 18:27        Management plans discussed with the patient, family and they are in agreement.  CODE STATUS:     Code Status Orders  Start     Ordered   08/29/15 2250  Do not attempt resuscitation (DNR)   Continuous    Question Answer Comment  In the event of cardiac or respiratory ARREST Do not call a "code blue"   In the event of cardiac or respiratory ARREST Do not perform Intubation, CPR, defibrillation or ACLS   In the event of cardiac or respiratory ARREST Use medication  by any route, position, wound care, and other measures to relive pain and suffering. May use oxygen, suction and manual treatment of airway obstruction as needed for comfort.      08/29/15 2250    Code Status History    Date Active Date Inactive Code Status Order ID Comments User Context   06/22/2015 12:15 PM 06/26/2015  6:28 PM DNR [id number]  [name], MD Inpatient    Advance Directive Documentation        Most Recent Value   Type of Advance Directive  Healthcare Power of Attorney   Pre-existing out of facility DNR order (yellow form or pink MOST form)     "MOST" Form in Place?        TOTAL TIME TAKING CARE OF THIS PATIENT: 42 minutes.    Demetrios Loll M.D on 08/30/2015 at 10:03 AM  Between 7am to 6pm - Pager - [phone]  After 6pm go to [email or web address] - password [email or web address] [facility]  Office  [phone]  CC: Primary care physician; Tracie Harrier, MD

## 2015-09-09 DEATH — deceased

## 2015-10-14 IMAGING — CR DG CHEST 1V
1 series · 1 of 1 positions shown · non-contrast
Comparison: Chest CT 02/14/2015

CLINICAL DATA: Post right thoracentesis.

EXAM:
CHEST 1 VIEW

[w chest pa]
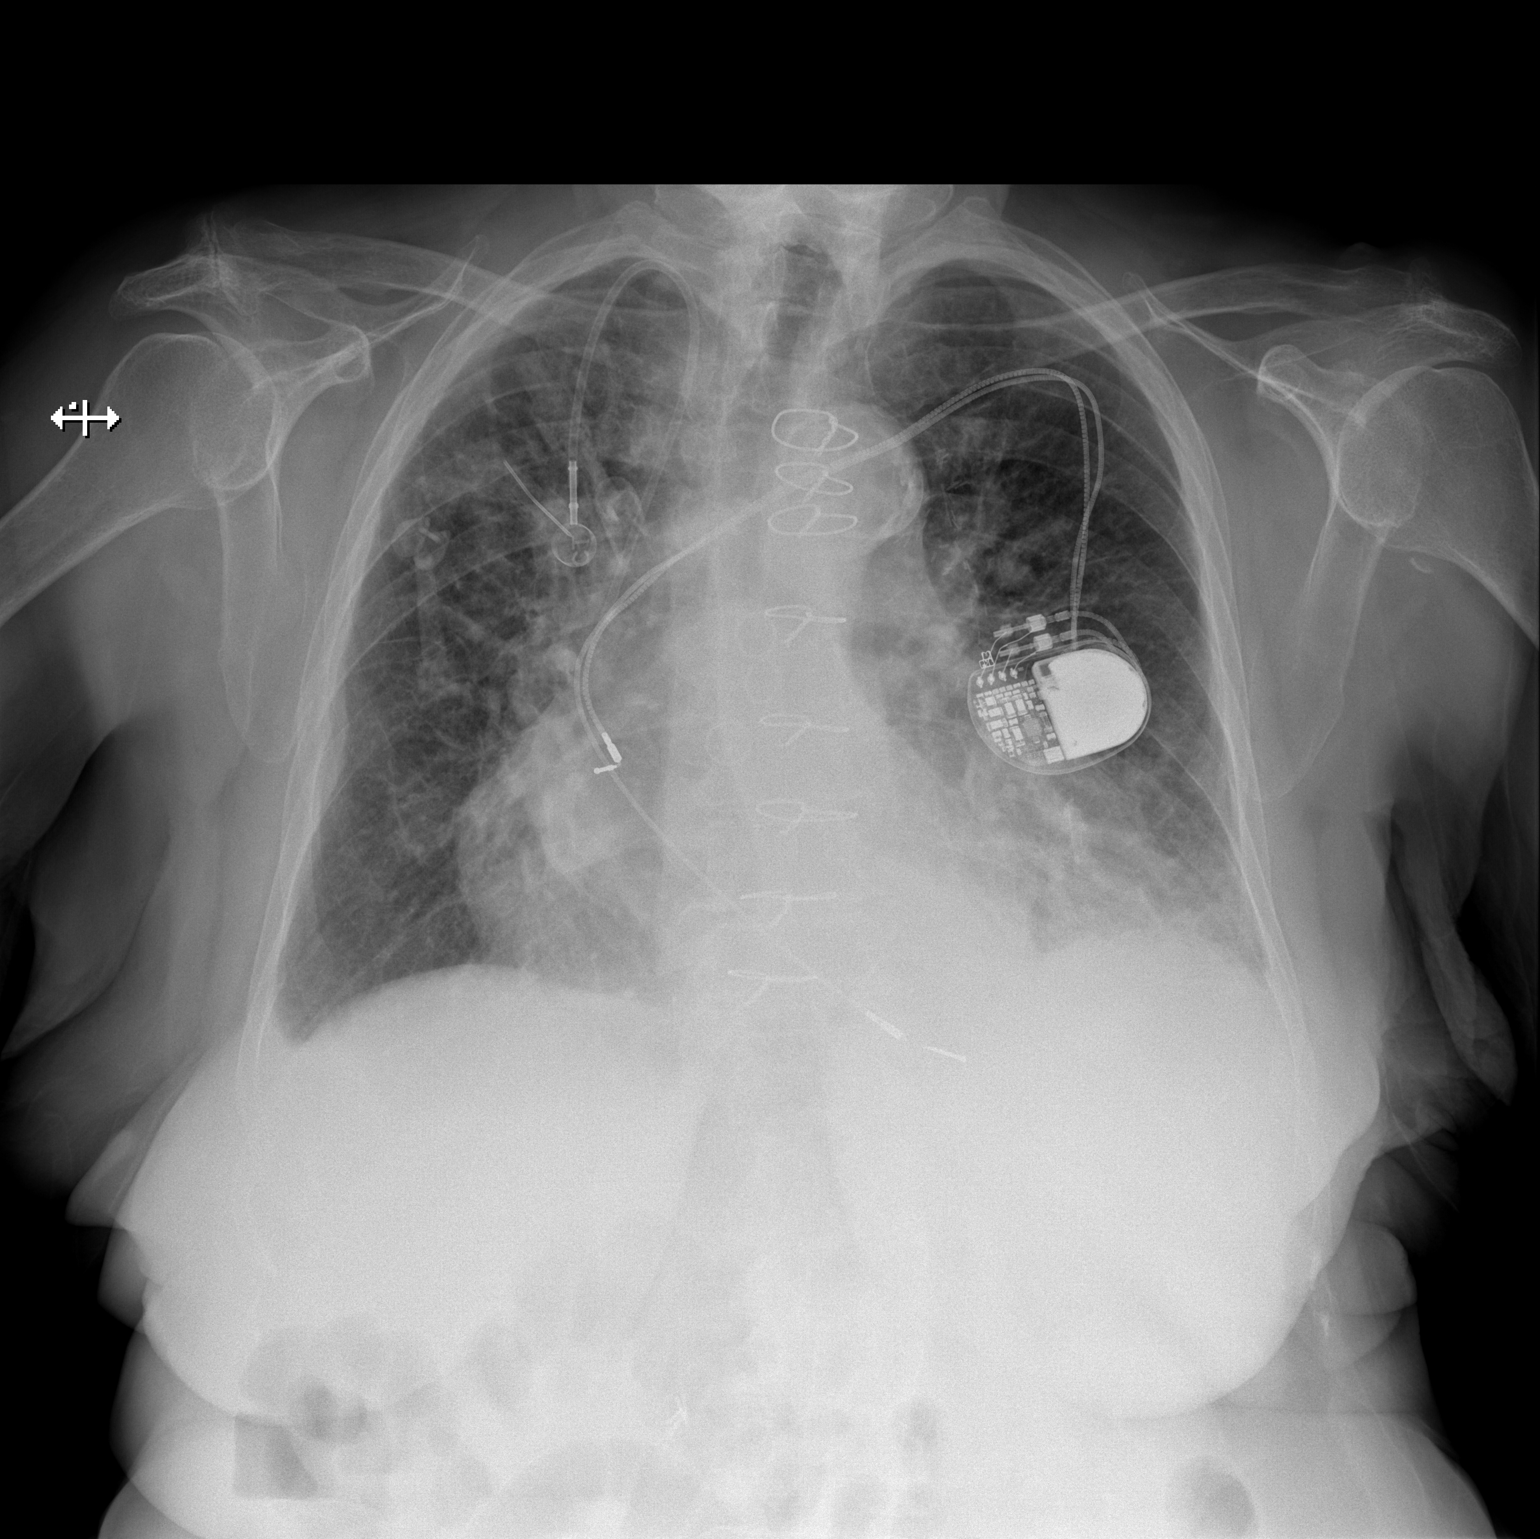

[1 of 1 positions shown; findings below may reference images not displayed]

FINDINGS: Stable enlargement of the cardiac silhouette with a dual chamber
cardiac pacemaker and previous median sternotomy. Port-A-Cath tip in
the SVC region. Mild blunting of the right costophrenic angle
suggestive for residual fluid or atelectasis. There is no evidence
for pneumothorax. Vague opacities in the right upper lung compatible
with known disease from the recent chest CT. Hazy densities at left
lung base compatible with pleural fluid and atelectasis. Trachea is
midline.
IMPRESSION: Negative for pneumothorax following the right thoracentesis.

Vague parenchymal densities in the right upper lung compatible with
recent CT findings.

Cardiomegaly.

Left pleural fluid with left basilar atelectasis.

## 2015-10-18 IMAGING — US US CHEST/MEDIASTINUM
1 series · 2 of 2 positions shown · non-contrast
Comparison: Chest radiograph of February 14, 2015.

CLINICAL DATA: Left pleural effusion.

EXAM:
CHEST ULTRASOUND

[Series 1: us chest/mediastinum · 0.24mm/px · 2 of 2 slices shown]
[im 1/2]
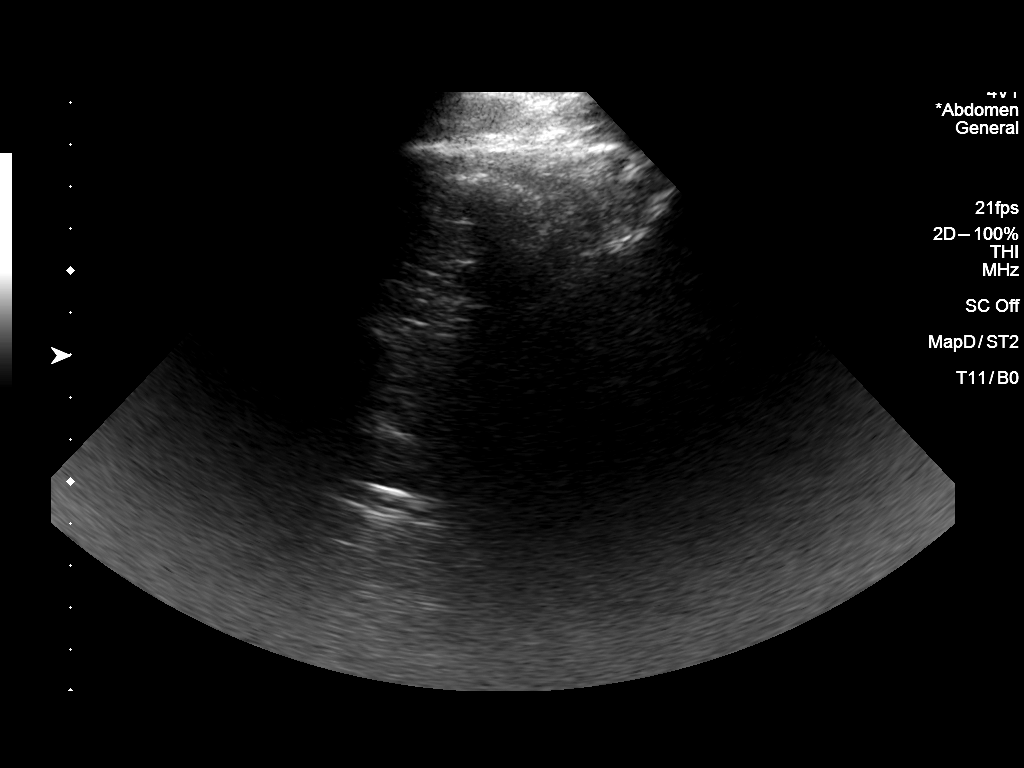
[im 2/2]
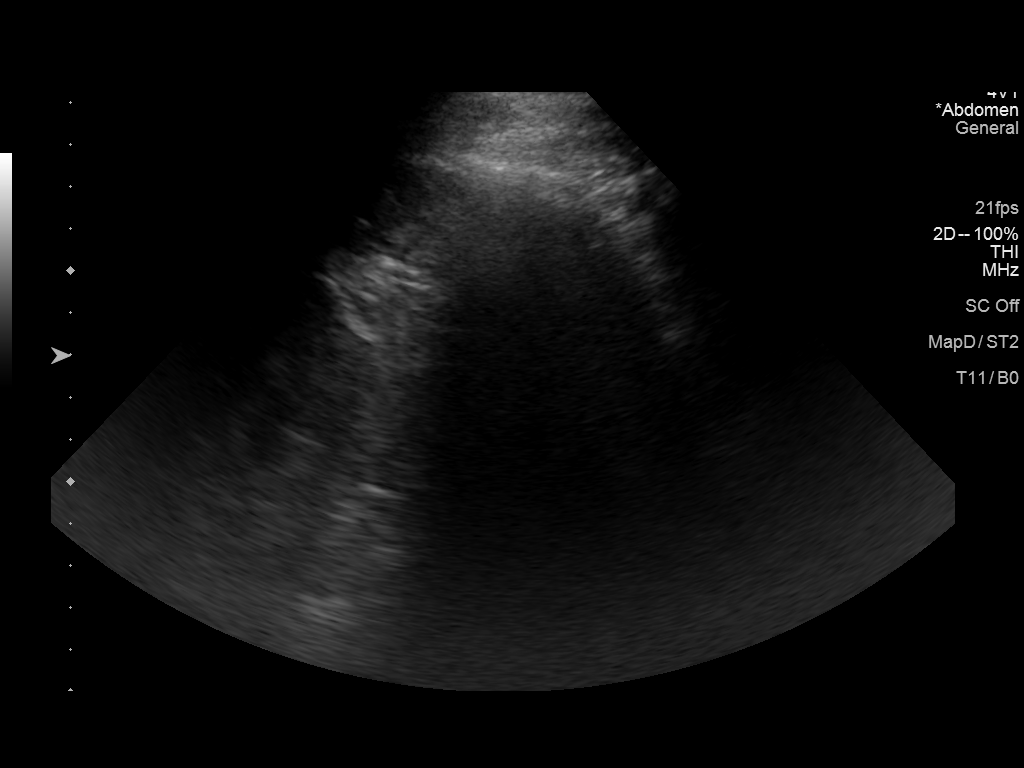

[2 of 2 positions shown; findings below may reference images not displayed]

FINDINGS: Sonographic evaluation of the left hemithorax demonstrates no
definite pleural effusion. Therefore, thoracentesis was not
performed.
IMPRESSION: No left pleural effusion is noted.

## 2015-11-11 ENCOUNTER — Other Ambulatory Visit: Payer: Self-pay | Admitting: Nurse Practitioner

## 2016-01-20 IMAGING — CT CT HEAD W/O CM
1 series · 15 of 29 positions shown, 19 images · non-contrast
Comparison: 11/06/2014 head CT .

CLINICAL DATA: Fall with head trauma, with right frontal hematoma.
Anticoagulated on Plavix. Small cell lung cancer.

EXAM:
CT HEAD WITHOUT CONTRAST
TECHNIQUE: Contiguous axial images were obtained from the base of the skull
through the vertex without intravenous contrast.

[Series 2: head wo · axial · 0.41mm/px · z∈[+187,+317]mm · 15 of 29 slices shown, 19 images]
[im 2/29  brain]
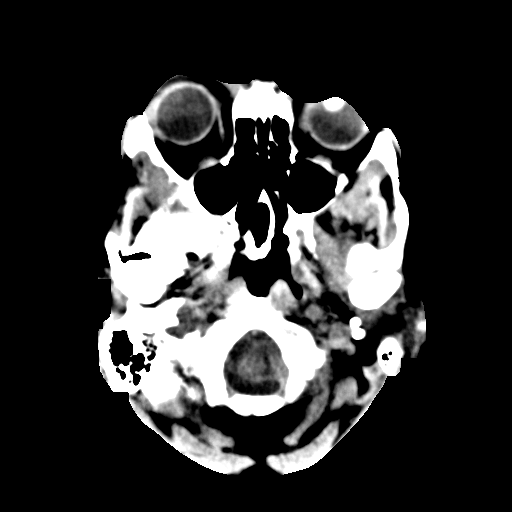
[im 2/29  bone]
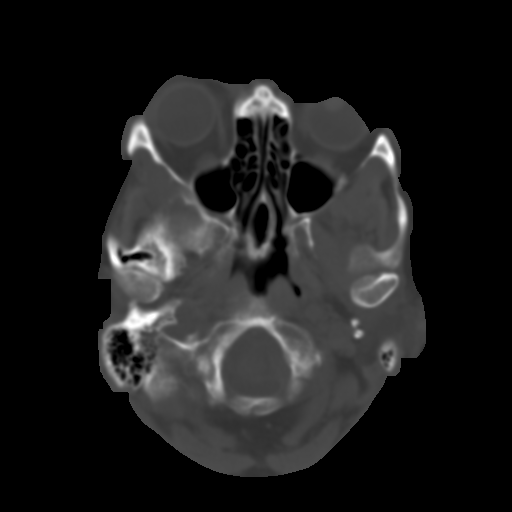
[im 4/29  brain]
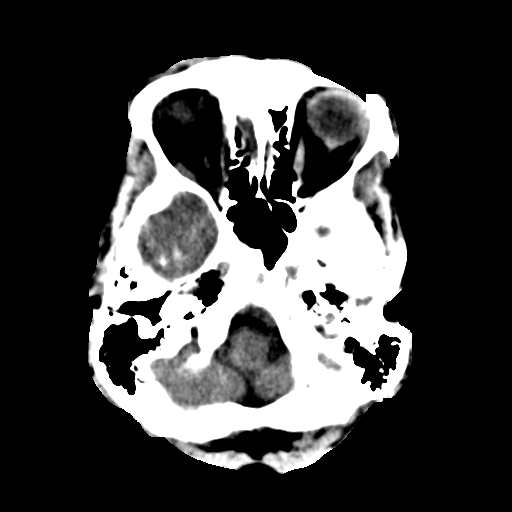
[im 6/29  brain]
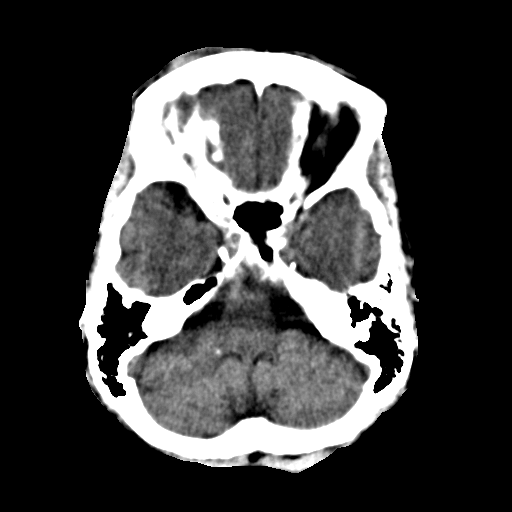
[im 8/29  brain]
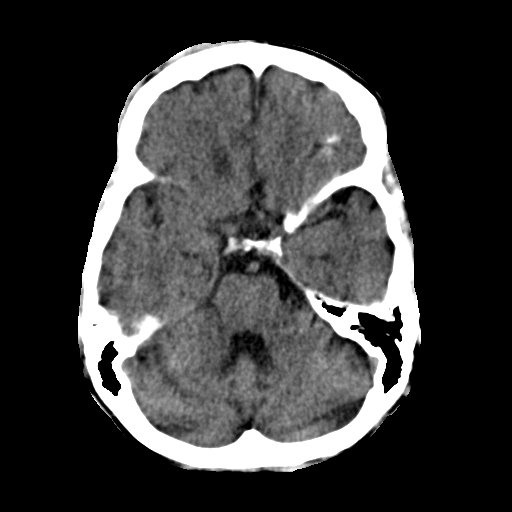
[im 10/29  brain]
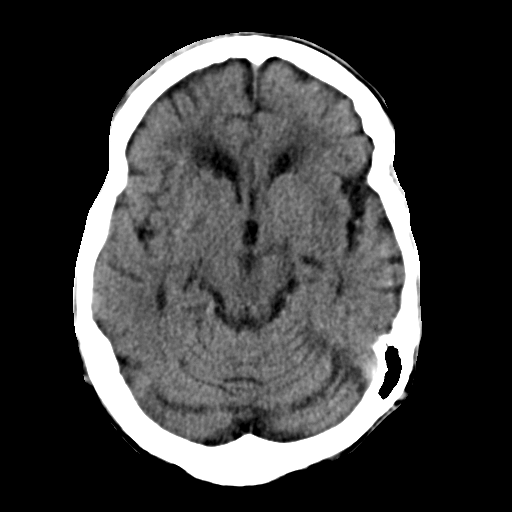
[im 10/29  bone]
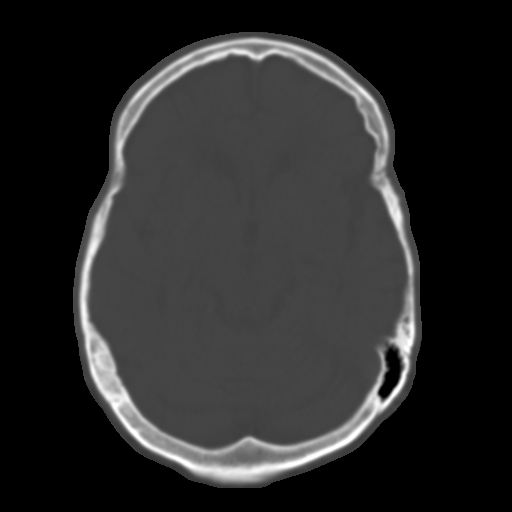
[im 11/29  brain]
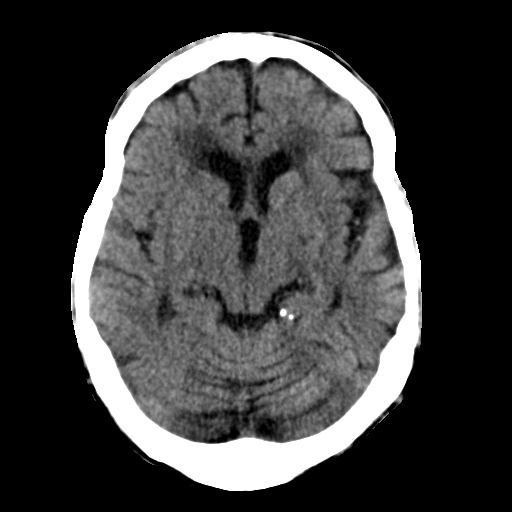
[im 13/29  brain]
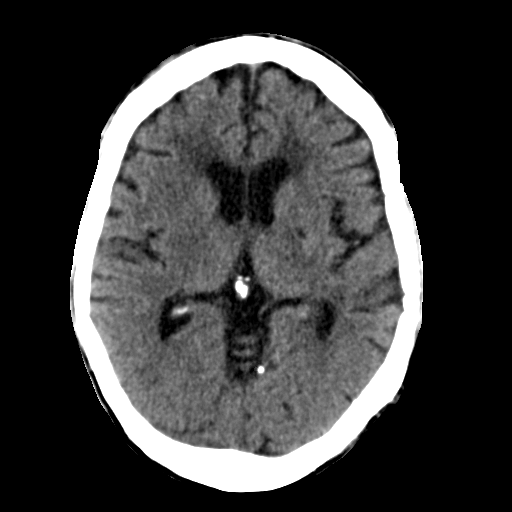
[im 15/29  brain]
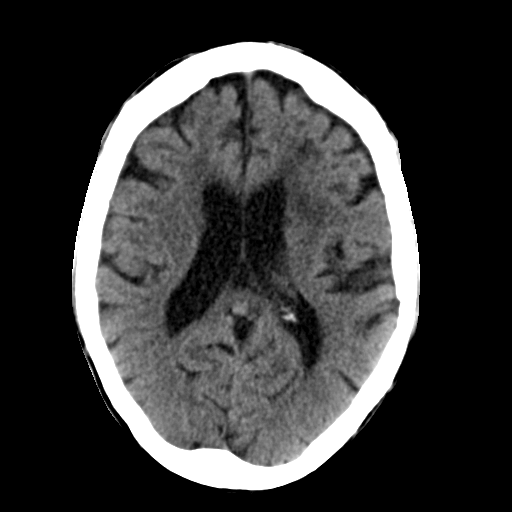
[im 17/29  brain]
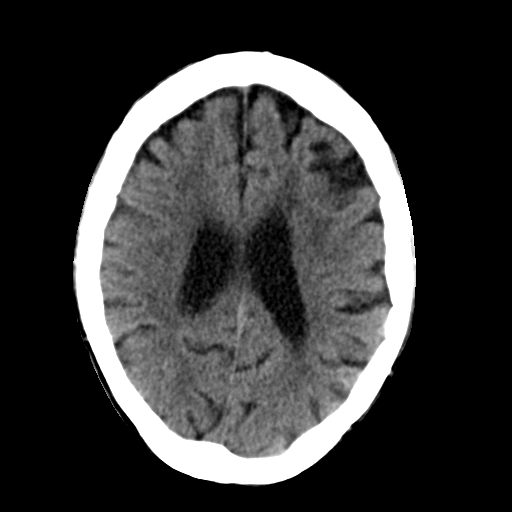
[im 17/29  bone]
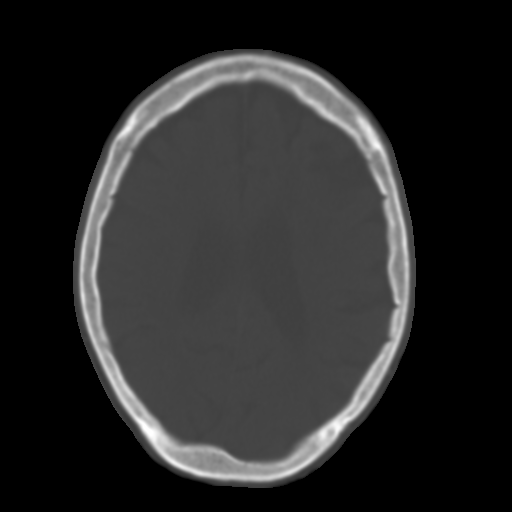
[im 19/29  brain]
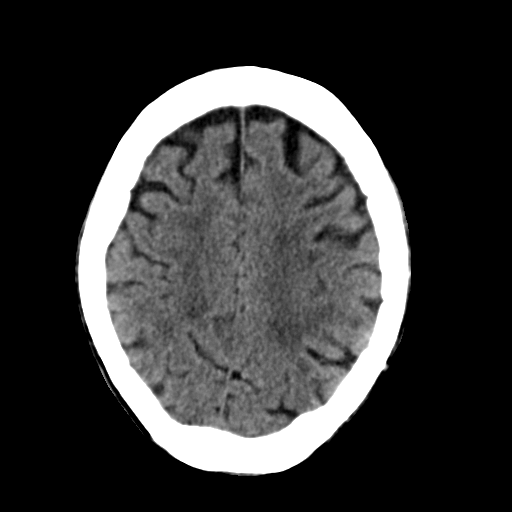
[im 20/29  brain]
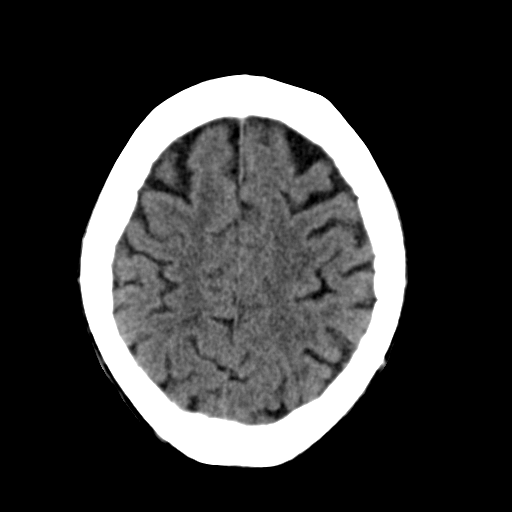
[im 22/29  brain]
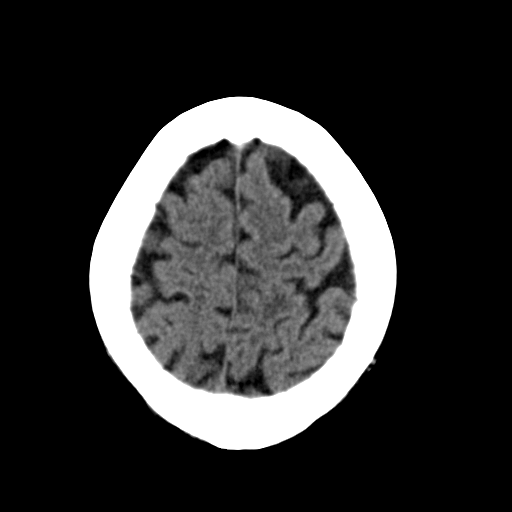
[im 24/29  brain]
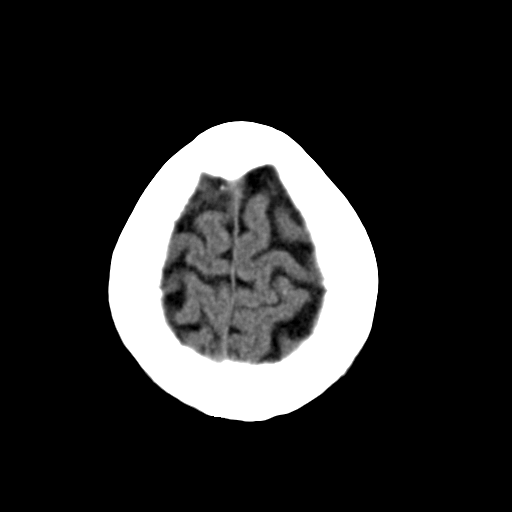
[im 24/29  bone]
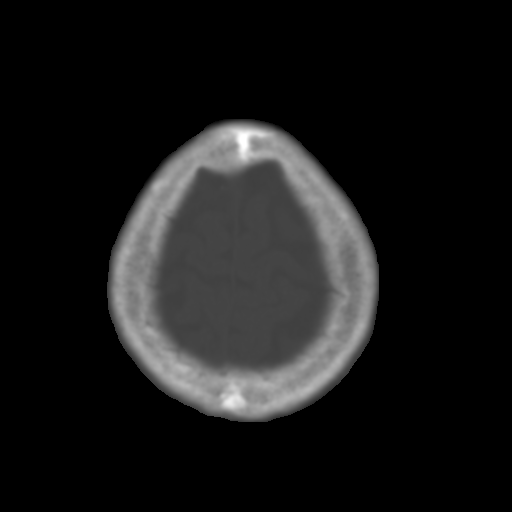
[im 26/29  brain]
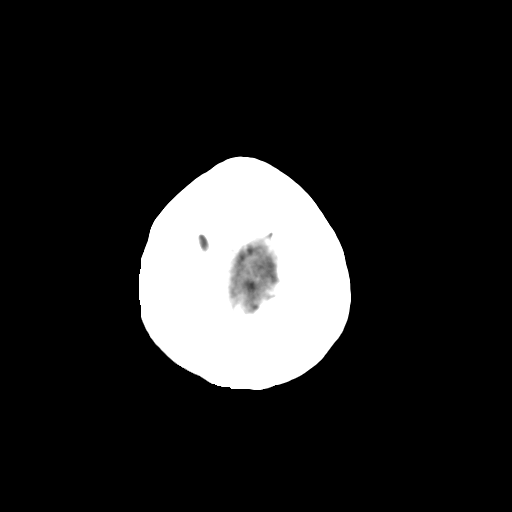
[im 28/29  brain]
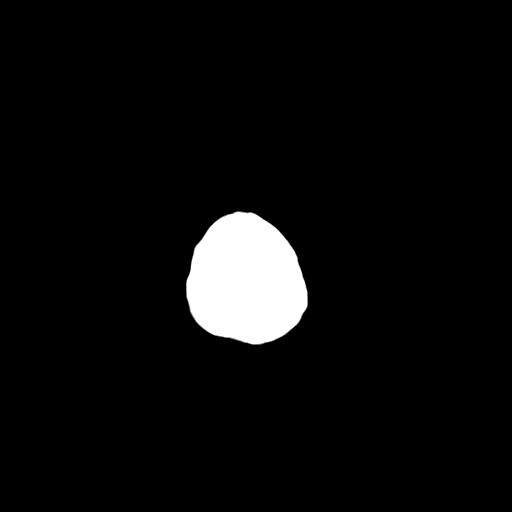

[15 of 29 positions shown; findings below may reference images not displayed]

FINDINGS: There is a right frontal scalp/right periorbital contusion. No soft
tissue abnormality in the visualized intraconal region. No evidence
of parenchymal hemorrhage or extra-axial fluid collection. No mass
lesion, mass effect, or midline shift.

No CT evidence of acute infarction. Intracranial atherosclerosis.
Nonspecific stable subcortical and periventricular white matter
hypodensity, most in keeping with chronic small vessel ischemic
change.

Diffuse cerebral volume loss. Stable small lacunar infarcts in the
left basal ganglia. No ventriculomegaly.

The visualized paranasal sinuses are essentially clear. The mastoid
air cells are unopacified. No evidence of calvarial fracture.
IMPRESSION: 1. Right frontal scalp/right periorbital contusion.
2. No evidence of acute intracranial abnormality. No calvarial
fracture.
3. Intracranial atherosclerosis, cerebral atrophy, small left basal
ganglia lacunar infarcts and chronic small vessel ischemic white
matter change, all stable.
